# Patient Record
Sex: Female | Born: 1935 | Race: Black or African American | Hispanic: No | Marital: Single | Smoking: Never smoker
Health system: Southern US, Community
[De-identification: ages and names within clinical notes are randomized; demographics above are authoritative.]

## PROBLEM LIST (undated history)

## (undated) DIAGNOSIS — D509 Iron deficiency anemia, unspecified: Secondary | ICD-10-CM

## (undated) DIAGNOSIS — I10 Essential (primary) hypertension: Secondary | ICD-10-CM

## (undated) DIAGNOSIS — E559 Vitamin D deficiency, unspecified: Secondary | ICD-10-CM

## (undated) DIAGNOSIS — R413 Other amnesia: Secondary | ICD-10-CM

## (undated) DIAGNOSIS — N289 Disorder of kidney and ureter, unspecified: Secondary | ICD-10-CM

## (undated) HISTORY — DX: Essential (primary) hypertension: I10

## (undated) HISTORY — DX: Vitamin D deficiency, unspecified: E55.9

## (undated) HISTORY — DX: Iron deficiency anemia, unspecified: D50.9

## (undated) HISTORY — PX: UTERINE FIBROID SURGERY: SHX826

## (undated) HISTORY — DX: Other amnesia: R41.3

---

## 2013-07-28 DIAGNOSIS — K599 Functional intestinal disorder, unspecified: Secondary | ICD-10-CM | POA: Diagnosis not present

## 2013-07-28 DIAGNOSIS — K649 Unspecified hemorrhoids: Secondary | ICD-10-CM | POA: Diagnosis not present

## 2013-07-28 DIAGNOSIS — K625 Hemorrhage of anus and rectum: Secondary | ICD-10-CM | POA: Diagnosis not present

## 2013-07-28 DIAGNOSIS — I10 Essential (primary) hypertension: Secondary | ICD-10-CM | POA: Diagnosis not present

## 2013-07-28 DIAGNOSIS — K5730 Diverticulosis of large intestine without perforation or abscess without bleeding: Secondary | ICD-10-CM | POA: Diagnosis not present

## 2013-07-28 DIAGNOSIS — Z1212 Encounter for screening for malignant neoplasm of rectum: Secondary | ICD-10-CM | POA: Diagnosis not present

## 2013-07-30 DIAGNOSIS — L851 Acquired keratosis [keratoderma] palmaris et plantaris: Secondary | ICD-10-CM | POA: Diagnosis not present

## 2013-07-30 DIAGNOSIS — M79609 Pain in unspecified limb: Secondary | ICD-10-CM | POA: Diagnosis not present

## 2013-07-30 DIAGNOSIS — B351 Tinea unguium: Secondary | ICD-10-CM | POA: Diagnosis not present

## 2013-07-30 DIAGNOSIS — I70219 Atherosclerosis of native arteries of extremities with intermittent claudication, unspecified extremity: Secondary | ICD-10-CM | POA: Diagnosis not present

## 2013-09-25 DIAGNOSIS — K599 Functional intestinal disorder, unspecified: Secondary | ICD-10-CM | POA: Diagnosis not present

## 2013-09-25 DIAGNOSIS — K5730 Diverticulosis of large intestine without perforation or abscess without bleeding: Secondary | ICD-10-CM | POA: Diagnosis not present

## 2013-10-01 DIAGNOSIS — I70219 Atherosclerosis of native arteries of extremities with intermittent claudication, unspecified extremity: Secondary | ICD-10-CM | POA: Diagnosis not present

## 2013-10-01 DIAGNOSIS — L851 Acquired keratosis [keratoderma] palmaris et plantaris: Secondary | ICD-10-CM | POA: Diagnosis not present

## 2013-10-01 DIAGNOSIS — M79609 Pain in unspecified limb: Secondary | ICD-10-CM | POA: Diagnosis not present

## 2013-10-01 DIAGNOSIS — B351 Tinea unguium: Secondary | ICD-10-CM | POA: Diagnosis not present

## 2013-11-05 DIAGNOSIS — H40019 Open angle with borderline findings, low risk, unspecified eye: Secondary | ICD-10-CM | POA: Diagnosis not present

## 2013-12-09 DIAGNOSIS — M79609 Pain in unspecified limb: Secondary | ICD-10-CM | POA: Diagnosis not present

## 2013-12-09 DIAGNOSIS — B351 Tinea unguium: Secondary | ICD-10-CM | POA: Diagnosis not present

## 2013-12-09 DIAGNOSIS — L851 Acquired keratosis [keratoderma] palmaris et plantaris: Secondary | ICD-10-CM | POA: Diagnosis not present

## 2013-12-09 DIAGNOSIS — I70219 Atherosclerosis of native arteries of extremities with intermittent claudication, unspecified extremity: Secondary | ICD-10-CM | POA: Diagnosis not present

## 2014-02-10 DIAGNOSIS — M79609 Pain in unspecified limb: Secondary | ICD-10-CM | POA: Diagnosis not present

## 2014-02-10 DIAGNOSIS — L851 Acquired keratosis [keratoderma] palmaris et plantaris: Secondary | ICD-10-CM | POA: Diagnosis not present

## 2014-02-10 DIAGNOSIS — B351 Tinea unguium: Secondary | ICD-10-CM | POA: Diagnosis not present

## 2014-02-10 DIAGNOSIS — I70219 Atherosclerosis of native arteries of extremities with intermittent claudication, unspecified extremity: Secondary | ICD-10-CM | POA: Diagnosis not present

## 2014-04-02 DIAGNOSIS — K649 Unspecified hemorrhoids: Secondary | ICD-10-CM | POA: Diagnosis not present

## 2014-04-02 DIAGNOSIS — K5730 Diverticulosis of large intestine without perforation or abscess without bleeding: Secondary | ICD-10-CM | POA: Diagnosis not present

## 2014-04-21 DIAGNOSIS — I70213 Atherosclerosis of native arteries of extremities with intermittent claudication, bilateral legs: Secondary | ICD-10-CM | POA: Diagnosis not present

## 2014-04-21 DIAGNOSIS — M79673 Pain in unspecified foot: Secondary | ICD-10-CM | POA: Diagnosis not present

## 2014-04-21 DIAGNOSIS — B351 Tinea unguium: Secondary | ICD-10-CM | POA: Diagnosis not present

## 2014-05-07 DIAGNOSIS — Z1231 Encounter for screening mammogram for malignant neoplasm of breast: Secondary | ICD-10-CM | POA: Diagnosis not present

## 2014-05-10 DIAGNOSIS — H40053 Ocular hypertension, bilateral: Secondary | ICD-10-CM | POA: Diagnosis not present

## 2014-05-11 DIAGNOSIS — I10 Essential (primary) hypertension: Secondary | ICD-10-CM | POA: Diagnosis not present

## 2014-05-11 DIAGNOSIS — E669 Obesity, unspecified: Secondary | ICD-10-CM | POA: Diagnosis not present

## 2014-05-11 DIAGNOSIS — E559 Vitamin D deficiency, unspecified: Secondary | ICD-10-CM | POA: Diagnosis not present

## 2014-05-11 DIAGNOSIS — E782 Mixed hyperlipidemia: Secondary | ICD-10-CM | POA: Diagnosis not present

## 2014-05-11 DIAGNOSIS — Z23 Encounter for immunization: Secondary | ICD-10-CM | POA: Diagnosis not present

## 2014-06-21 DIAGNOSIS — G243 Spasmodic torticollis: Secondary | ICD-10-CM | POA: Diagnosis not present

## 2014-09-16 DIAGNOSIS — M79674 Pain in right toe(s): Secondary | ICD-10-CM | POA: Diagnosis not present

## 2014-09-16 DIAGNOSIS — B351 Tinea unguium: Secondary | ICD-10-CM | POA: Diagnosis not present

## 2014-09-16 DIAGNOSIS — I70213 Atherosclerosis of native arteries of extremities with intermittent claudication, bilateral legs: Secondary | ICD-10-CM | POA: Diagnosis not present

## 2014-09-16 DIAGNOSIS — M79675 Pain in left toe(s): Secondary | ICD-10-CM | POA: Diagnosis not present

## 2014-10-22 DIAGNOSIS — E782 Mixed hyperlipidemia: Secondary | ICD-10-CM | POA: Diagnosis not present

## 2014-10-22 DIAGNOSIS — I10 Essential (primary) hypertension: Secondary | ICD-10-CM | POA: Diagnosis not present

## 2014-11-01 DIAGNOSIS — H25013 Cortical age-related cataract, bilateral: Secondary | ICD-10-CM | POA: Diagnosis not present

## 2015-01-24 DIAGNOSIS — F418 Other specified anxiety disorders: Secondary | ICD-10-CM | POA: Diagnosis not present

## 2015-03-14 DIAGNOSIS — M79675 Pain in left toe(s): Secondary | ICD-10-CM | POA: Diagnosis not present

## 2015-03-14 DIAGNOSIS — B351 Tinea unguium: Secondary | ICD-10-CM | POA: Diagnosis not present

## 2015-03-14 DIAGNOSIS — I70213 Atherosclerosis of native arteries of extremities with intermittent claudication, bilateral legs: Secondary | ICD-10-CM | POA: Diagnosis not present

## 2015-03-14 DIAGNOSIS — M79674 Pain in right toe(s): Secondary | ICD-10-CM | POA: Diagnosis not present

## 2015-05-04 DIAGNOSIS — H40023 Open angle with borderline findings, high risk, bilateral: Secondary | ICD-10-CM | POA: Diagnosis not present

## 2015-05-25 DIAGNOSIS — K589 Irritable bowel syndrome without diarrhea: Secondary | ICD-10-CM | POA: Diagnosis not present

## 2015-07-26 ENCOUNTER — Encounter: Payer: Self-pay | Admitting: Podiatry

## 2015-07-26 ENCOUNTER — Ambulatory Visit (INDEPENDENT_AMBULATORY_CARE_PROVIDER_SITE_OTHER): Payer: Medicare Other | Admitting: Podiatry

## 2015-07-26 VITALS — Resp 16

## 2015-07-26 DIAGNOSIS — Q828 Other specified congenital malformations of skin: Secondary | ICD-10-CM

## 2015-07-26 DIAGNOSIS — M79676 Pain in unspecified toe(s): Secondary | ICD-10-CM | POA: Diagnosis not present

## 2015-07-26 DIAGNOSIS — G629 Polyneuropathy, unspecified: Secondary | ICD-10-CM

## 2015-07-26 DIAGNOSIS — B351 Tinea unguium: Secondary | ICD-10-CM | POA: Diagnosis not present

## 2015-07-26 NOTE — Progress Notes (Signed)
   Subjective:    Patient ID: Floyce Stakes, female    DOB: Jan 23, 1929, 82 y.o.   MRN: [id number]  HPI  82 year old female presents the office today for concerns of thick, painful, elongated toenails for which she could not trim herself. She denies any swelling redness or drainage. She also gets painful calluses to both of her feet without any swelling redness or drainage. She is not diabetic and she has no tendon numbness to her feet no claudication symptoms. No recent injury or trauma. No other complaints.  Review of Systems  All other systems reviewed and are negative.      Objective:   Physical Exam General: AAO x3, NAD  Dermatological: Nails are hypertrophic, dystrophic, brittle, discolored, elongated 10. No surrounding erythema or drainage. There is tenderness palpation on nails 1-5 bilaterally. Along the left-sided metatarsal 5 and right plantar hallux hyperkeratotic lesions. Upon debridement there was no underlying ulceration, drainage or other signs of infection. No other open lesions or pre-ulcerative lesions identified.  Vascular: Dorsalis Pedis artery and Posterior Tibial artery pedal pulses are 1/4 bilateral with immedate capillary fill time. Pedal hair growth present. There is no pain with calf compression, swelling, warmth, erythema.   Neruologic: Sensation decreased with Derrel Nip monofilament although vibratory sensation intact.  Musculoskeletal: Hammertoes presents to lesser digits. No pain, crepitus, or limitation noted with foot and ankle range of motion bilateral. Muscular strength 5/5 in all groups tested bilateral.  Gait: Unassisted, Nonantalgic.       Assessment & Plan:  82 year old female with symptomatic onychomycosis, hyperkeratotic lesions -Treatment options discussed including all alternatives, risks, and complications -Etiology of symptoms were discussed -Nail sharply debrided 10 without complications or bleeding -Hyperkeratotic lesions sharply  debrided 2 without complications or bleeding. Offloading pads dispensed. -She does have decreased sensation with SWMF although she has no symptoms. We'll continue to monitor for neuropathy symptoms.  -Daily foot inspection.  -Follow-up in 3 months or sooner if any problems arise. In the meantime, encouraged to call the office with any questions, concerns, change in symptoms.   Celesta Gentile, DPM

## 2015-08-12 DIAGNOSIS — R413 Other amnesia: Secondary | ICD-10-CM | POA: Diagnosis not present

## 2015-08-12 DIAGNOSIS — I10 Essential (primary) hypertension: Secondary | ICD-10-CM | POA: Diagnosis not present

## 2015-08-12 DIAGNOSIS — E559 Vitamin D deficiency, unspecified: Secondary | ICD-10-CM | POA: Diagnosis not present

## 2015-08-12 DIAGNOSIS — E538 Deficiency of other specified B group vitamins: Secondary | ICD-10-CM | POA: Diagnosis not present

## 2015-08-12 DIAGNOSIS — R419 Unspecified symptoms and signs involving cognitive functions and awareness: Secondary | ICD-10-CM | POA: Diagnosis not present

## 2015-08-15 ENCOUNTER — Ambulatory Visit: Payer: Medicare Other | Admitting: Podiatry

## 2015-08-23 ENCOUNTER — Ambulatory Visit: Payer: Medicare Other | Admitting: Neurology

## 2015-08-24 ENCOUNTER — Encounter: Payer: Self-pay | Admitting: Neurology

## 2015-09-01 ENCOUNTER — Encounter: Payer: Self-pay | Admitting: Neurology

## 2015-09-01 ENCOUNTER — Ambulatory Visit (INDEPENDENT_AMBULATORY_CARE_PROVIDER_SITE_OTHER): Payer: Medicare Other | Admitting: Neurology

## 2015-09-01 VITALS — BP 172/84 | HR 73 | Ht <= 58 in | Wt 150.2 lb

## 2015-09-01 DIAGNOSIS — I10 Essential (primary) hypertension: Secondary | ICD-10-CM | POA: Diagnosis not present

## 2015-09-01 DIAGNOSIS — F05 Delirium due to known physiological condition: Secondary | ICD-10-CM

## 2015-09-01 DIAGNOSIS — R413 Other amnesia: Secondary | ICD-10-CM

## 2015-09-01 DIAGNOSIS — F22 Delusional disorders: Secondary | ICD-10-CM | POA: Diagnosis not present

## 2015-09-01 DIAGNOSIS — R410 Disorientation, unspecified: Secondary | ICD-10-CM

## 2015-09-01 MED ORDER — DONEPEZIL HCL 10 MG PO TABS: 10.0000 mg | ORAL_TABLET | Freq: Every day | ORAL | Status: DC

## 2015-09-01 NOTE — Patient Instructions (Signed)
Remember to drink plenty of fluid, eat healthy meals and do not skip any meals. Try to eat protein with a every meal and eat a healthy snack such as fruit or nuts in between meals. Try to keep a regular sleep-wake schedule and try to exercise daily, particularly in the form of walking, 20-30 minutes a day, if you can.   As far as your medications are concerned, I would like to suggest: Aricept 1/2 tab for a month then increase to a whole tab  As far as diagnostic testing: MRI of thebrain  I would like to see you back in 6 months, sooner if we need to. Please call us with any interim questions, concerns, problems, updates or refill requests.   Our phone number is [phone]. We also have an after hours call service for urgent matters and there is a physician on-call for urgent questions. For any emergencies you know to call 911 or go to the nearest emergency room

## 2015-09-01 NOTE — Progress Notes (Signed)
[facility]    Provider:  Dr Jaynee Eagles Referring Provider: Tia Alert, PA Primary Care Physician:  Tia Alert, [place]  CC:  Memory loss  HPI:  Shawna Lin is a 82 y.o. female here as a referral from Dr. Thayer Jew for memory loss. She just moved into the area and is here with her niece [name]. Memory problems for over a year. Niece provides most information. Patient constantly repeating herself, misplaces things, will think people came intot he house and stole things and then the object will be found. She is really worried about finances. Other niece has power of attorney. She lives in independent living in a senior community. [name] assists with bills otherwise patient would not be able to handle the finances. Patient is good with medication and appears to take them all correctly. Patient does not drive. She performs most of her own ADLs but needs help with some IADLs such as driving, needs help with bills and help with shopping. No accidents in the home,no leaving a stove on for example. She doesn't do a lot of cooking, she uses the microwave mostly. Niece is looking into a meals program. No FHx of memory loss or dementia. She is pretty good at her appointments and will call call niece to remind her of the appointment multiple times, unsure if she forgets she called and then calls again. Patient writes down her appointments, she is organized. No hallucinations. She has significant anxiety and some depression due to change of circumstances. No significant depression and it is getting better, getting out with her friends and shopping and staying social. Memory loss slowly progressive and worsening. She moves money and then forgets and thinks people stole it. Patient is very pleasant. She is a virgin so declines rpr and hiv testing as part of the dementio workup.  Reviewed notes, labs and imaging from outside physicians, which showed: Reviewed records from [facility] physicians. She  last saw them on 08/12/2015 and is a new patient. She c/o memory loss. Both nieces at appointment and reported memory loss. They described patient losing things, paranoia such as thinking people are stealing money from her, some blue mood but nothing significant. Labs showed b12 265 and homocysteine and methylmalonic acid are being followed by pcp. tsh and vit D normal. Bmp nml. No other concerns per family.   Review of Systems: Patient complains of symptoms per HPI as well as the following symptoms: memory loss, confusion, depression, racing thoughts. Pertinent negatives per HPI. All others negative.   Social History   Social History  . Marital Status: Single    Spouse Name: N/A  . Number of Children: 0  . Years of Education: 12   Occupational History  . Retired    Social History Main Topics  . Smoking status: Never Smoker   . Smokeless tobacco: Not on file  . Alcohol Use: No  . Drug Use: No  . Sexual Activity: Not on file   Other Topics Concern  . Not on file   Social History Narrative   Lives alone   Caffeine use: 2 cups coffee per day    Family History  Problem Relation Age of Onset  . Heart attack Father   . Hypertension Mother   . Colon cancer Brother   . Hypertension Brother   . Diabetes Sister   . Kidney failure Sister   . Hypertension Sister   . Dementia Neg Hx     Past Medical History  Diagnosis Date  .  Hypertension   . Vitamin D deficiency   . Memory loss     Past Surgical History  Procedure Laterality Date  . Uterine fibroid surgery      Current Outpatient Prescriptions  Medication Sig Dispense Refill  . Aspirin 81 MG EC tablet Take 81 mg by mouth daily.    . Cholecalciferol (VITAMIN D-3) 1000 units CAPS Take 1 capsule by mouth daily.    . hydrochlorothiazide (HYDRODIURIL) 25 MG tablet Take 25 mg by mouth daily.    Marland Kitchen donepezil (ARICEPT) 10 MG tablet Take 1 tablet (10 mg total) by mouth at bedtime. 30 tablet 12   No current  facility-administered medications for this visit.    Allergies as of 09/01/2015  . (No Known Allergies)    Vitals: BP 172/84 mmHg  Pulse 73  Ht 4\' 10"  (1.473 m)  Wt 150 lb 3.2 oz (68.13 kg)  BMI 31.40 kg/m2 Last Weight:  Wt Readings from Last 1 Encounters:  09/01/15 150 lb 3.2 oz (68.13 kg)   Last Height:   Ht Readings from Last 1 Encounters:  09/01/15 4\' 10"  (1.473 m)   Physical exam: Exam: Gen: NAD, conversant, well nourised, obese, well groomed                     CV: RRR, no MRG. No Carotid Bruits. No peripheral edema, warm, nontender Eyes: Conjunctivae clear without exudates or hemorrhage  Neuro: Detailed Neurologic Exam  Speech:    Speech is normal; fluent and spontaneous with normal comprehension.  Cognition:  MMSE - Mini Mental State Exam 09/01/2015  Orientation to time 1  Orientation to Place 1  Registration 3  Attention/ Calculation 5  Recall 0  Language- name 2 objects 2  Language- repeat 1  Language- follow 3 step command 2  Language- read & follow direction 1  Write a sentence 1  Copy design 0  Total score 17   Cranial Nerves:    The pupils are equal, round, and reactive to light. Attempted fundoscopic exam but could not visualize. Visual fields are full to finger confrontation. Extraocular movements are intact. Trigeminal sensation is intact and the muscles of mastication are normal. The face is symmetric. The palate elevates in the midline. Hearing intact. Voice is normal. Shoulder shrug is normal. The tongue has normal motion without fasciculations.   Coordination:    Normal finger to nose and heel to shin. Normal rapid alternating movements.   Gait:    No ataxia  Motor Observation:    No asymmetry, no atrophy, and no involuntary movements noted. Tone:    Normal muscle tone.    Posture:    Posture is normal. normal erect    Strength:    Strength is V/V in the upper and lower limbs.      Sensation: intact to LT     Reflex  Exam:  DTR's:    Deep tendon reflexes in the upper and lower extremities are symmetrical bilaterally.   Toes:    The toes are downgoing bilaterally.   Clonus:    Clonus is absent.       Assessment/Plan:  34 82 year old with memory loss. MMSE 17/30 appears to be moderate in severity and likely of the alzheimer's type of dementia. TSH, B12 followed by pcp. Need an MRI of the brain. Will start Aricept. F/u 6 months.  Cc: Alvan Dame, MD  [facility] [address] [place], [place] [address]  Phone  [phone] Fax [phone]

## 2015-09-02 ENCOUNTER — Encounter: Payer: Self-pay | Admitting: Neurology

## 2015-09-02 DIAGNOSIS — I10 Essential (primary) hypertension: Secondary | ICD-10-CM | POA: Insufficient documentation

## 2015-09-02 DIAGNOSIS — R413 Other amnesia: Secondary | ICD-10-CM | POA: Insufficient documentation

## 2015-09-14 ENCOUNTER — Ambulatory Visit
Admission: RE | Admit: 2015-09-14 | Discharge: 2015-09-14 | Disposition: A | Discharge status: Home / Self Care | Payer: Medicare Other | Source: Ambulatory Visit | Attending: Neurology | Admitting: Neurology

## 2015-09-14 ENCOUNTER — Other Ambulatory Visit: Payer: Self-pay

## 2015-09-14 DIAGNOSIS — R410 Disorientation, unspecified: Secondary | ICD-10-CM

## 2015-09-14 DIAGNOSIS — F05 Delirium due to known physiological condition: Secondary | ICD-10-CM

## 2015-09-14 DIAGNOSIS — F22 Delusional disorders: Secondary | ICD-10-CM

## 2015-09-14 DIAGNOSIS — R413 Other amnesia: Secondary | ICD-10-CM | POA: Diagnosis not present

## 2015-09-19 ENCOUNTER — Telehealth: Payer: Self-pay | Admitting: *Deleted

## 2015-09-19 NOTE — Telephone Encounter (Signed)
Called and spoke to niece about MRI results per Dr Jaynee Eagles note. She verbalized understanding. OK per DPR.

## 2015-09-19 NOTE — Telephone Encounter (Signed)
-----   Message from Melvenia Beam, MD sent at 09/16/2015 10:32 AM EST ----- Terrence Dupont, there are no strokes or tumors or masses on MRI of the brain. We saw some white matter changes which is likely due to her hugh blood pressure as well as normal aging. Most significant is there is some atrophy. This can be seen in normal aging as all our brains shrink as we age however patient's is more pronounced in the temporal lobes which is the area we associate with memory - and changes here are seen in alzheimer's dementia. I can show it to them when they come to the next appointment. The good news is that there are no strokes or masses but the location of the atrophy is associated with memory loss and alzheimer's dementia. Thanks.

## 2015-10-31 ENCOUNTER — Ambulatory Visit: Payer: Medicare Other | Admitting: Sports Medicine

## 2015-12-21 ENCOUNTER — Encounter (HOSPITAL_COMMUNITY): Payer: Self-pay | Admitting: Emergency Medicine

## 2015-12-21 ENCOUNTER — Emergency Department (HOSPITAL_COMMUNITY): Payer: Medicare Other

## 2015-12-21 ENCOUNTER — Observation Stay (HOSPITAL_COMMUNITY)
Admission: EM | Admit: 2015-12-21 | Discharge: 2015-12-23 | Disposition: A | Discharge status: Home / Self Care | Payer: Medicare Other | Attending: Internal Medicine | Admitting: Internal Medicine

## 2015-12-21 DIAGNOSIS — W19XXXA Unspecified fall, initial encounter: Secondary | ICD-10-CM

## 2015-12-21 DIAGNOSIS — R55 Syncope and collapse: Principal | ICD-10-CM | POA: Insufficient documentation

## 2015-12-21 DIAGNOSIS — D649 Anemia, unspecified: Secondary | ICD-10-CM | POA: Insufficient documentation

## 2015-12-21 DIAGNOSIS — I10 Essential (primary) hypertension: Secondary | ICD-10-CM | POA: Diagnosis not present

## 2015-12-21 DIAGNOSIS — Z7982 Long term (current) use of aspirin: Secondary | ICD-10-CM | POA: Insufficient documentation

## 2015-12-21 DIAGNOSIS — E860 Dehydration: Secondary | ICD-10-CM | POA: Diagnosis not present

## 2015-12-21 DIAGNOSIS — Z789 Other specified health status: Secondary | ICD-10-CM | POA: Diagnosis present

## 2015-12-21 DIAGNOSIS — F0390 Unspecified dementia without behavioral disturbance: Secondary | ICD-10-CM | POA: Diagnosis not present

## 2015-12-21 DIAGNOSIS — R404 Transient alteration of awareness: Secondary | ICD-10-CM | POA: Diagnosis not present

## 2015-12-21 DIAGNOSIS — R262 Difficulty in walking, not elsewhere classified: Secondary | ICD-10-CM | POA: Diagnosis not present

## 2015-12-21 LAB — URINE MICROSCOPIC-ADD ON

## 2015-12-21 LAB — URINALYSIS, ROUTINE W REFLEX MICROSCOPIC
BILIRUBIN URINE: NEGATIVE
GLUCOSE, UA: NEGATIVE mg/dL
KETONES UR: NEGATIVE mg/dL
Nitrite: NEGATIVE
PH: 6.5 (ref 5.0–8.0)
PROTEIN: 30 mg/dL — AB
Specific Gravity, Urine: 1.016 (ref 1.005–1.030)

## 2015-12-21 LAB — BASIC METABOLIC PANEL
Anion gap: 6 (ref 5–15)
BUN: 11 mg/dL (ref 6–20)
CHLORIDE: 105 mmol/L (ref 101–111)
CO2: 28 mmol/L (ref 22–32)
CREATININE: 0.95 mg/dL (ref 0.44–1.00)
Calcium: 9.5 mg/dL (ref 8.9–10.3)
GFR calc Af Amer: >60 mL/min (ref >60)
GFR calc non Af Amer: 53 mL/min — ABNORMAL LOW (ref >60)
GLUCOSE: 100 mg/dL — AB (ref 65–99)
Potassium: 3.5 mmol/L (ref 3.5–5.1)
SODIUM: 139 mmol/L (ref 135–145)

## 2015-12-21 LAB — CBC
HEMATOCRIT: 37.5 % (ref 36.0–46.0)
Hemoglobin: 11.6 g/dL — ABNORMAL LOW (ref 12.0–15.0)
MCH: 24.6 pg — AB (ref 26.0–34.0)
MCHC: 30.9 g/dL (ref 30.0–36.0)
MCV: 79.4 fL (ref 78.0–100.0)
PLATELETS: 251 10*3/uL (ref 150–400)
RBC: 4.72 MIL/uL (ref 3.87–5.11)
RDW: 17 % — AB (ref 11.5–15.5)
WBC: 7 10*3/uL (ref 4.0–10.5)

## 2015-12-21 LAB — I-STAT TROPONIN, ED: Troponin i, poc: 0 ng/mL (ref 0.00–0.08)

## 2015-12-21 LAB — PHOSPHORUS: PHOSPHORUS: 3.9 mg/dL (ref 2.5–4.6)

## 2015-12-21 LAB — CBG MONITORING, ED: Glucose-Capillary: 105 mg/dL — ABNORMAL HIGH (ref 65–99)

## 2015-12-21 LAB — TROPONIN I: Troponin I: <0.03 ng/mL (ref <0.031)

## 2015-12-21 LAB — MAGNESIUM: Magnesium: 2 mg/dL (ref 1.7–2.4)

## 2015-12-21 MED ORDER — DONEPEZIL HCL 10 MG PO TABS
10.0000 mg | ORAL_TABLET | Freq: Every day | ORAL | Status: DC
  Administered 2015-12-21: 10 mg via ORAL
  Filled 2015-12-21 (×2): qty 1

## 2015-12-21 MED ORDER — ENOXAPARIN SODIUM 40 MG/0.4ML ~~LOC~~ SOLN
40.0000 mg | SUBCUTANEOUS | Status: DC
  Administered 2015-12-21: 40 mg via SUBCUTANEOUS
  Filled 2015-12-21 (×2): qty 0.4

## 2015-12-21 MED ORDER — SODIUM CHLORIDE 0.9 % IV SOLN
INTRAVENOUS | Status: AC
  Administered 2015-12-21: 22:00:00 via INTRAVENOUS

## 2015-12-21 MED ORDER — SODIUM CHLORIDE 0.9% FLUSH
3.0000 mL | Freq: Two times a day (BID) | INTRAVENOUS | Status: DC
  Administered 2015-12-21 – 2015-12-22 (×2): 3 mL via INTRAVENOUS

## 2015-12-21 MED ORDER — ASPIRIN EC 81 MG PO TBEC
81.0000 mg | DELAYED_RELEASE_TABLET | Freq: Every day | ORAL | Status: DC
  Administered 2015-12-21 – 2015-12-23 (×3): 81 mg via ORAL
  Filled 2015-12-21 (×3): qty 1

## 2015-12-21 NOTE — ED Provider Notes (Signed)
CSN: [id number]     Arrival date & time 12/21/15  1858 History   First MD Initiated Contact with Patient 12/21/15 1910     Chief Complaint  Patient presents with  . Loss of Consciousness     (Consider location/radiation/quality/duration/timing/severity/associated sxs/prior Treatment) HPI Comments: Patient history of hypertension presents after syncopal episode. She states she was sitting in a chair stating her Bible and suddenly passed out. She did fall to the ground but she denies any injuries from the fall. She denies any headache. She denies any neck or back pain. She states that she feels fine now. She denies any recent illnesses. No preceding symptoms such as dizziness chest pain or shortness of breath. She denies any palpitations. She had some aching in her left leg earlier but denies any leg pain currently. She has some chronic swelling in her left leg as compared to her right which she states is at baseline. There was no witnessed seizure activity. Per bystander, her syncope lasted about a minute.  Patient is a 82 y.o. female presenting with syncope.  Loss of Consciousness Associated symptoms: no chest pain, no diaphoresis, no dizziness, no fever, no headaches, no nausea, no shortness of breath, no vomiting and no weakness     Past Medical History  Diagnosis Date  . Hypertension   . Vitamin D deficiency   . Memory loss    Past Surgical History  Procedure Laterality Date  . Uterine fibroid surgery     Family History  Problem Relation Age of Onset  . Heart attack Father   . Hypertension Mother   . Colon cancer Brother   . Hypertension Brother   . Diabetes Sister   . Kidney failure Sister   . Hypertension Sister   . Dementia Neg Hx    Social History  Substance Use Topics  . Smoking status: Never Smoker   . Smokeless tobacco: None  . Alcohol Use: No   OB History    No data available     Review of Systems  Constitutional: Negative for fever, chills, diaphoresis and  fatigue.  HENT: Negative for congestion, rhinorrhea and sneezing.   Eyes: Negative.   Respiratory: Negative for cough, chest tightness and shortness of breath.   Cardiovascular: Positive for syncope. Negative for chest pain and leg swelling.  Gastrointestinal: Negative for nausea, vomiting, abdominal pain, diarrhea and blood in stool.  Genitourinary: Negative for frequency, hematuria, flank pain and difficulty urinating.  Musculoskeletal: Negative for back pain and arthralgias.  Skin: Negative for rash.  Neurological: Positive for syncope. Negative for dizziness, speech difficulty, weakness, numbness and headaches.      Allergies  Review of patient's allergies indicates no known allergies.  Home Medications   Prior to Admission medications   Medication Sig Start Date End Date Taking? Authorizing Provider  Aspirin 81 MG EC tablet Take 81 mg by mouth daily.    Historical Provider, MD  Cholecalciferol (VITAMIN D-3) 1000 units CAPS Take 1 capsule by mouth daily.    Historical Provider, MD  donepezil (ARICEPT) 10 MG tablet Take 1 tablet (10 mg total) by mouth at bedtime. 09/01/15   Melvenia Beam, MD  hydrochlorothiazide (HYDRODIURIL) 25 MG tablet Take 25 mg by mouth daily.    Historical Provider, MD   BP 149/66 mmHg  Pulse 65  Temp(Src) 99 F (37.2 C) (Oral)  Resp 15  SpO2 100% Physical Exam  Constitutional: She is oriented to person, place, and time. She appears well-developed and well-nourished.  HENT:  Head: Normocephalic and atraumatic.  Eyes: Pupils are equal, round, and reactive to light.  Neck: Normal range of motion. Neck supple.  Cardiovascular: Normal rate and regular rhythm.   Murmur heard. Pulmonary/Chest: Effort normal and breath sounds normal. No respiratory distress. She has no wheezes. She has no rales. She exhibits no tenderness.  Abdominal: Soft. Bowel sounds are normal. There is no tenderness. There is no rebound and no guarding.  Musculoskeletal: Normal range  of motion. She exhibits edema (Trace edema in the bilateral lower extremities with the left being slightly greater than the right, no calf tenderness. Patient states this is at baseline).  Lymphadenopathy:    She has no cervical adenopathy.  Neurological: She is alert and oriented to person, place, and time. She has normal strength. No cranial nerve deficit or sensory deficit. GCS eye subscore is 4. GCS verbal subscore is 5. GCS motor subscore is 6.  Finger to nose intact, no pronator drift  Skin: Skin is warm and dry. No rash noted.  Psychiatric: She has a normal mood and affect.    ED Course  Procedures (including critical care time) Labs Review Labs Reviewed  BASIC METABOLIC PANEL - Abnormal; Notable for the following:    Glucose, Bld 100 (*)    GFR calc non Af Amer 53 (*)    All other components within normal limits  CBC - Abnormal; Notable for the following:    Hemoglobin 11.6 (*)    MCH 24.6 (*)    RDW 17.0 (*)    All other components within normal limits  CBG MONITORING, ED - Abnormal; Notable for the following:    Glucose-Capillary 105 (*)    All other components within normal limits  URINALYSIS, ROUTINE W REFLEX MICROSCOPIC (NOT AT [facility])  Randolm Idol, ED    Imaging Review Dg Chest 2 View  12/21/2015  CLINICAL DATA:  Syncope EXAM: CHEST  2 VIEW COMPARISON:  None. FINDINGS: There is no edema or consolidation. Heart size and pulmonary vascularity are normal. No adenopathy. There is degenerative change in the thoracic spine. IMPRESSION: No edema or consolidation. Electronically Signed   By: Lowella Grip III M.D.   On: 12/21/2015 20:16   I have personally reviewed and evaluated these images and lab results as part of my medical decision-making.   EKG Interpretation   Date/Time:  Wednesday December 21 2015 19:08:10 EDT Ventricular Rate:  67 PR Interval:  154 QRS Duration: 86 QT Interval:  385 QTC Calculation: 406 R Axis:   68 Text Interpretation:  Sinus rhythm  Abnormal R-wave progression, early  transition No old tracing to compare Confirmed by Lynleigh Kovack  MD, [address]  [id number]) on 12/21/2015 7:42:00 PM      MDM   Final diagnoses:  Syncope, unspecified syncope type    Patient presents after syncopal episode. She had no preceding symptoms. She is at baseline now. There is no neurologic deficits. No chest pain shortness of breath or other suggestions of acute coronary syndrome. Her labs are unremarkable. It is concerning for possible arrhythmia. I will consult the hospitalist for admission to telemetry.  Discussed with Dr. Roel Cluck who will admit pt to obs/tele.  Malvin Johns, MD 12/21/15 2040

## 2015-12-21 NOTE — H&P (Signed)
Shawna Lin [id number] DOB: November 02, 1928 DOA: 12/21/2015     PCP: Tia Alert, PA   Outpatient Specialists: [facility]  Patient coming from:  home Lives alone,      Chief Complaint: syncope  HPI: Shawna Lin is a 82 y.o. female with medical history significant of dementia, HTN    Presented with questionable syncope while doing Bible study today. She was sitting in a chair. She turned to the right   to get up and fell to the floor. No head injury, no neurological compliants  As soon as she hit the floor she was awake but disoriented for few minutes. Family dialed 911. BP  201/108 no seizure activity. NO chest pain, no prodrome, no lightheadness no palpitations.  Prior to that she reported her left leg was hurting.  She has been getting more confused and have been diagnosed with dementia by Neurology in [place] and started on Aricept   Regarding pertinent Chronic problems: Patient is here from [place] has hx of HTN but unsure if it well controlled, no hx of CAD or DM. Per family her dementia has been getting worse she started to get more confused.    IN ER: Afebrile BP up to  179/79, HR 64 WBC 7.0 Hg 11.6cr 0.95 CXR (unremarkable)    Hospitalist was called for admission for questionable syncope  Review of Systems:    Pertinent positives include: confusion  Constitutional:  No weight loss, night sweats, Fevers, chills, fatigue, weight loss  HEENT:  No headaches, Difficulty swallowing,Tooth/dental problems,Sore throat,  No sneezing, itching, ear ache, nasal congestion, post nasal drip,  Cardio-vascular:  No chest pain, Orthopnea, PND, anasarca, dizziness, palpitations.no Bilateral lower extremity swelling  GI:  No heartburn, indigestion, abdominal pain, nausea, vomiting, diarrhea, change in bowel habits, loss of appetite, melena, blood in stool, hematemesis Resp:  no shortness of breath at rest. No dyspnea on exertion, No excess mucus, no productive cough, No  non-productive cough, No coughing up of blood.No change in color of mucus.No wheezing. Skin:  no rash or lesions. No jaundice GU:  no dysuria, change in color of urine, no urgency or frequency. No straining to urinate.  No flank pain.  Musculoskeletal:  No joint pain or no joint swelling. No decreased range of motion. No back pain.  Psych:  No change in mood or affect. No depression or anxiety. No memory loss.  Neuro: no localizing neurological complaints, no tingling, no weakness, no double vision, no gait abnormality, no slurred speech, no confusion  As per HPI otherwise 10 point review of systems negative.   Past Medical History: Past Medical History  Diagnosis Date  . Hypertension   . Vitamin D deficiency   . Memory loss    Past Surgical History  Procedure Laterality Date  . Uterine fibroid surgery       Social History:  Ambulatory   Independently     reports that she has never smoked. She does not have any smokeless tobacco history on file. She reports that she does not drink alcohol or use illicit drugs.  Allergies:  No Known Allergies     Family History:    Family History  Problem Relation Age of Onset  . Heart attack Father   . Hypertension Mother   . Colon cancer Brother   . Hypertension Brother   . Diabetes Sister   . Kidney failure Sister   . Hypertension Sister   . Dementia Neg Hx     Medications:  Prior to Admission medications   Medication Sig Start Date End Date Taking? Authorizing Provider  Aspirin 81 MG EC tablet Take 81 mg by mouth daily.    Historical Provider, MD  Cholecalciferol (VITAMIN D-3) 1000 units CAPS Take 1 capsule by mouth daily.    Historical Provider, MD  donepezil (ARICEPT) 10 MG tablet Take 1 tablet (10 mg total) by mouth at bedtime. 09/01/15   Melvenia Beam, MD  hydrochlorothiazide (HYDRODIURIL) 25 MG tablet Take 25 mg by mouth daily.    Historical Provider, MD    Physical Exam: Patient Vitals for the past 24 hrs:  BP  Temp Temp src Pulse Resp SpO2  12/21/15 2023 149/66 mmHg - - 65 15 100 %  12/21/15 1911 179/79 mmHg 99 F (37.2 C) Oral 74 18 99 %  12/21/15 1901 - - - - - 99 %    1. General:  in No Acute distress 2. Psychological: Alert and  Oriented to situation but not date,not president 3. Head/ENT:    Dry Mucous Membranes                          Head Non traumatic, neck supple                            Poor Dentition 4. SKIN:   decreased Skin turgor,  Skin clean Dry and intact no rash 5. Heart: Regular rate and rhythm slight  Murmur no Rub or gallop 6. Lungs:   no wheezes or crackles   7. Abdomen: Soft, non-tender, Non distended 8. Lower extremities: no clubbing, cyanosis, or edema 9. Neurologically Grossly intact, moving all 4 extremities equally 10. MSK: Normal range of motion   body mass index is unknown because there is no weight on file.  Labs on Admission:   Labs on Admission: I have personally reviewed following labs and imaging studies  CBC:  Recent Labs Lab 12/21/15 1917  WBC 7.0  HGB 11.6*  HCT 37.5  MCV 79.4  PLT 123XX123   Basic Metabolic Panel:  Recent Labs Lab 12/21/15 1917  NA 139  K 3.5  CL 105  CO2 28  GLUCOSE 100*  BUN 11  CREATININE 0.95  CALCIUM 9.5   GFR: CrCl cannot be calculated (Unknown ideal weight.). Liver Function Tests: No results for input(s): AST, ALT, ALKPHOS, BILITOT, PROT, ALBUMIN in the last 168 hours. No results for input(s): LIPASE, AMYLASE in the last 168 hours. No results for input(s): AMMONIA in the last 168 hours. Coagulation Profile: No results for input(s): INR, PROTIME in the last 168 hours. Cardiac Enzymes: No results for input(s): CKTOTAL, CKMB, CKMBINDEX, TROPONINI in the last 168 hours. BNP (last 3 results) No results for input(s): PROBNP in the last 8760 hours. HbA1C: No results for input(s): HGBA1C in the last 72 hours. CBG:  Recent Labs Lab 12/21/15 1915  GLUCAP 105*   Lipid Profile: No results for input(s):  CHOL, HDL, LDLCALC, TRIG, CHOLHDL, LDLDIRECT in the last 72 hours. Thyroid Function Tests: No results for input(s): TSH, T4TOTAL, FREET4, T3FREE, THYROIDAB in the last 72 hours. Anemia Panel: No results for input(s): VITAMINB12, FOLATE, FERRITIN, TIBC, IRON, RETICCTPCT in the last 72 hours. Urine analysis:    Component Value Date/Time   COLORURINE YELLOW 12/21/2015 1931   APPEARANCEUR CLEAR 12/21/2015 1931   LABSPEC 1.016 12/21/2015 1931   PHURINE 6.5 12/21/2015 1931   GLUCOSEU NEGATIVE 12/21/2015 1931  HGBUR TRACE* 12/21/2015 1931   BILIRUBINUR NEGATIVE 12/21/2015 1931   KETONESUR NEGATIVE 12/21/2015 1931   PROTEINUR 30* 12/21/2015 1931   NITRITE NEGATIVE 12/21/2015 1931   LEUKOCYTESUR SMALL* 12/21/2015 1931   Sepsis Labs: @LABRCNTIP (procalcitonin:4,lacticidven:4) )No results found for this or any previous visit (from the past 240 hour(s)).   UA   no evidence of UTI  No results found for: HGBA1C  CrCl cannot be calculated (Unknown ideal weight.).  BNP (last 3 results) No results for input(s): PROBNP in the last 8760 hours.   ECG REPORT  Independently reviewed Rate:67  Rhythm: NSR ST&T Change: No acute ischemic changes   QTC 406  There were no vitals filed for this visit.   Cultures: No results found for: SDES, Early, CULT, REPTSTATUS   Radiological Exams on Admission: Dg Chest 2 View  12/21/2015  CLINICAL DATA:  Syncope EXAM: CHEST  2 VIEW COMPARISON:  None. FINDINGS: There is no edema or consolidation. Heart size and pulmonary vascularity are normal. No adenopathy. There is degenerative change in the thoracic spine. IMPRESSION: No edema or consolidation. Electronically Signed   By: Lowella Grip III M.D.   On: 12/21/2015 20:16    Chart has been reviewed    Assessment/Plan  82 y.o. female with medical history significant of dementia, HTN here with questionable syncopal event  Present on Admission:  . Atypical syncope - Admit to telemetry, cycle  Cardiac enzymes,  obtain echogram, carotid Doppler due to advanced age.  Etiology unclear is not clear if patient truly suffered a complete loss of consciousness. We will rehydrate his subclinical dehydration as part of process. Obtain orthostatics This could've been a possible mechanical fall but given advanced age will evaluate thoroughly  . HTN (hypertension) poorly controlled. For tonight hold hydrochlorothiazide and rehydrate give labetalol when necessary  . Dementia continue Aricept  . No blood products no indication for transfusion at this point     Other plan as per orders.  DVT prophylaxis:    Lovenox     Code Status:  FULL CODE  as per patient   Jehovah witness no blood products  Family Communication:   Family  at  Bedside  plan of care was discussed with Niece Karie Mainland [phone]  Sister Elisabeth Most [phone]  Disposition Plan:  To home once workup is complete and patient is stable   Consults called: none   Admission status:   obs    Level of care     tele     [place] 12/21/2015, 9:27 PM    [facility]  Pager [phone]   after 2 AM please page floor coverage PA If 7AM-7PM, please contact the day team taking care of the patient  [email or web address]  Password [email or web address]

## 2015-12-21 NOTE — ED Notes (Signed)
Admitting at bedside 

## 2015-12-21 NOTE — ED Notes (Signed)
Per EMS pt was at bible study sitting down, then had syncopal episode in chair. Patient had negative stroke screen. Pt was hypertension 201/108- sts on HCTZ and had recent lowered dosage. Pt alert and oriented x4 at present, no neuro deficits, mildly incontinent of urine. Bystanders deny seizure like activity. Pt denies headache, visual disturbances.

## 2015-12-21 NOTE — ED Notes (Signed)
EDP at bedside  

## 2015-12-21 NOTE — ED Notes (Signed)
Pt ambulatory to bathroom. Standby assist. Steady gait noted

## 2015-12-22 ENCOUNTER — Observation Stay (HOSPITAL_BASED_OUTPATIENT_CLINIC_OR_DEPARTMENT_OTHER): Payer: Medicare Other

## 2015-12-22 ENCOUNTER — Observation Stay (HOSPITAL_COMMUNITY): Payer: Medicare Other

## 2015-12-22 DIAGNOSIS — R55 Syncope and collapse: Secondary | ICD-10-CM

## 2015-12-22 DIAGNOSIS — I10 Essential (primary) hypertension: Secondary | ICD-10-CM

## 2015-12-22 DIAGNOSIS — F0390 Unspecified dementia without behavioral disturbance: Secondary | ICD-10-CM | POA: Diagnosis not present

## 2015-12-22 DIAGNOSIS — S0990XA Unspecified injury of head, initial encounter: Secondary | ICD-10-CM | POA: Diagnosis not present

## 2015-12-22 LAB — ECHOCARDIOGRAM COMPLETE
AOPV: 0.59 m/s
AV Area VTI index: 1.01 cm2/m2
AV Area VTI: 1.49 cm2
AV Area mean vel: 1.5 cm2
AV VEL mean LVOT/AV: 0.59
AV pk vel: 255 cm/s
AVA: 1.6 cm2
AVAREAMEANVIN: 0.94 cm2/m2
AVG: 13 mmHg
AVPG: 26 mmHg
CHL CUP AV PEAK INDEX: 0.94
CHL CUP AV VEL: 1.6
CHL CUP MV DEC (S): 232
CHL CUP TV REG PEAK VELOCITY: 259 cm/s
DOP CAL AO MEAN VELOCITY: 164 cm/s
E decel time: 232 msec
E/e' ratio: 11.71
FS: 46 % — AB (ref 28–44)
IVS/LV PW RATIO, ED: 1.27
LA ID, A-P, ES: 33 mm
LA diam end sys: 33 mm
LA vol A4C: 64.5 ml
LA vol index: 38.3 mL/m2
LA vol: 60.9 mL
LADIAMINDEX: 2.08 cm/m2
LV PW d: 12.4 mm — AB (ref 0.6–1.1)
LV TDI E'LATERAL: 7.94
LV TDI E'MEDIAL: 7.51
LVEEAVG: 11.71
LVEEMED: 11.71
LVELAT: 7.94 cm/s
LVOT VTI: 32.5 cm
LVOT area: 2.54 cm2
LVOT peak grad rest: 9 mmHg
LVOT peak vel: 150 cm/s
LVOTD: 18 mm
LVOTSV: 83 mL
LVOTVTI: 0.63 cm
MV Peak grad: 3 mmHg
MVPKAVEL: 135 m/s
MVPKEVEL: 93 m/s
TAPSE: 19.5 mm
TR max vel: 259 cm/s
VTI: 51.6 cm
Valve area index: 1.01
WEIGHTICAEL: 2339.2 [oz_av]

## 2015-12-22 LAB — COMPREHENSIVE METABOLIC PANEL
ALBUMIN: 3.1 g/dL — AB (ref 3.5–5.0)
ALT: 20 U/L (ref 14–54)
AST: 29 U/L (ref 15–41)
Alkaline Phosphatase: 70 U/L (ref 38–126)
Anion gap: 4 — ABNORMAL LOW (ref 5–15)
BUN: 9 mg/dL (ref 6–20)
CHLORIDE: 108 mmol/L (ref 101–111)
CO2: 30 mmol/L (ref 22–32)
Calcium: 9.1 mg/dL (ref 8.9–10.3)
Creatinine, Ser: 0.82 mg/dL (ref 0.44–1.00)
GFR calc Af Amer: >60 mL/min (ref >60)
GFR calc non Af Amer: >60 mL/min (ref >60)
GLUCOSE: 81 mg/dL (ref 65–99)
POTASSIUM: 3.6 mmol/L (ref 3.5–5.1)
SODIUM: 142 mmol/L (ref 135–145)
Total Bilirubin: 0.5 mg/dL (ref 0.3–1.2)
Total Protein: 5.2 g/dL — ABNORMAL LOW (ref 6.5–8.1)

## 2015-12-22 LAB — VAS US CAROTID
LCCAPDIAS: 14 cm/s
LEFT ECA DIAS: -19 cm/s
LEFT VERTEBRAL DIAS: 15 cm/s
LICADDIAS: -13 cm/s
LICAPDIAS: -11 cm/s
LICAPSYS: -71 cm/s
Left CCA dist dias: -25 cm/s
Left CCA dist sys: -112 cm/s
Left CCA prox sys: 128 cm/s
Left ICA dist sys: -53 cm/s
RIGHT ECA DIAS: -17 cm/s
RIGHT VERTEBRAL DIAS: 10 cm/s
Right CCA prox dias: 15 cm/s
Right CCA prox sys: 162 cm/s
Right cca dist sys: -82 cm/s

## 2015-12-22 LAB — CBC
HEMATOCRIT: 33.3 % — AB (ref 36.0–46.0)
Hemoglobin: 10.3 g/dL — ABNORMAL LOW (ref 12.0–15.0)
MCH: 24.4 pg — ABNORMAL LOW (ref 26.0–34.0)
MCHC: 30.9 g/dL (ref 30.0–36.0)
MCV: 78.9 fL (ref 78.0–100.0)
Platelets: 243 10*3/uL (ref 150–400)
RBC: 4.22 MIL/uL (ref 3.87–5.11)
RDW: 17.1 % — AB (ref 11.5–15.5)
WBC: 7.2 10*3/uL (ref 4.0–10.5)

## 2015-12-22 LAB — GLUCOSE, CAPILLARY
Glucose-Capillary: 81 mg/dL (ref 65–99)
Glucose-Capillary: 122 mg/dL — ABNORMAL HIGH (ref 65–99)

## 2015-12-22 LAB — TROPONIN I

## 2015-12-22 MED ORDER — SODIUM CHLORIDE 0.9 % IV SOLN
INTRAVENOUS | Status: AC
  Administered 2015-12-22: 14:00:00 via INTRAVENOUS

## 2015-12-22 NOTE — Progress Notes (Signed)
  Echocardiogram 2D Echocardiogram has been performed.  Darlina Sicilian M 12/22/2015, 4:54 PM

## 2015-12-22 NOTE — Progress Notes (Signed)
PROGRESS NOTE    Shawna Lin  [id number] DOB: 11-19-1928 DOA: 12/21/2015 PCP: Tia Alert, PA    Brief Narrative:  Shawna Lin is a 82 y.o. female with medical history significant of dementia, HTN, Presented with questionable syncope while doing Bible study today.   Assessment & Plan:   Active Problems:   HTN (hypertension)   Syncope   Dementia   No blood products   Atypical syncope  Syncopal episode : Unclear etiology,  Cardiac enzymes negative. Asymptomatic.  Ct head negative.  Echo pending.  Carotid duplex negative to 1 to 39% Orthostatic vital signs.    Dementia: stable.   Mild normocytic anemia: Stable.   Hypertension:  Better controlled.     DVT prophylaxis: (Lovenox/ Code Status: (Full/ Family Communication: fa,ily at bedside.  Disposition Plan: pending pt eval.    Consultants:   none   Procedures:   CT head  Vascular duplex  echocardiogram  Antimicrobials: none  Subjective: Alert and comfortable.   Objective: Filed Vitals:   12/22/15 1209 12/22/15 1340 12/22/15 1342 12/22/15 1356  BP: 154/91 133/84 161/80 158/70  Pulse: 62 72 81 67  Temp: 97.6 F (36.4 C)     TempSrc:      Resp: 18 18 18    Weight:      SpO2: 100% 100% 95%     Intake/Output Summary (Last 24 hours) at 12/22/15 1649 Last data filed at 12/22/15 1044  Gross per 24 hour  Intake   1680 ml  Output      0 ml  Net   1680 ml   Filed Weights   12/22/15 0715  Weight: 66.316 kg (146 lb 3.2 oz)    Examination:  General exam: Appears calm and comfortable  Respiratory system: Clear to auscultation. Respiratory effort normal. Cardiovascular system: S1 & S2 heard, RRR. No JVD, murmurs, rubs, gallops or clicks. No pedal edema. Gastrointestinal system: Abdomen is nondistended, soft and nontender. No organomegaly or masses felt. Normal bowel sounds heard. Central nervous system: Alert and oriented. No focal neurological deficits. Extremities: Symmetric 5 x 5  power. Skin: No rashes, lesions or ulcers Psychiatry: Judgement and insight appear normal. Mood & affect appropriate.     Data Reviewed: I have personally reviewed following labs and imaging studies  CBC:  Recent Labs Lab 12/21/15 1917 12/22/15 0327  WBC 7.0 7.2  HGB 11.6* 10.3*  HCT 37.5 33.3*  MCV 79.4 78.9  PLT 251 0000000   Basic Metabolic Panel:  Recent Labs Lab 12/21/15 1917 12/21/15 2214 12/22/15 0327  NA 139  --  142  K 3.5  --  3.6  CL 105  --  108  CO2 28  --  30  GLUCOSE 100*  --  81  BUN 11  --  9  CREATININE 0.95  --  0.82  CALCIUM 9.5  --  9.1  MG  --  2.0  --   PHOS  --  3.9  --    GFR: Estimated Creatinine Clearance: 40.5 mL/min (by C-G formula based on Cr of 0.82). Liver Function Tests:  Recent Labs Lab 12/22/15 0327  AST 29  ALT 20  ALKPHOS 70  BILITOT 0.5  PROT 5.2*  ALBUMIN 3.1*   No results for input(s): LIPASE, AMYLASE in the last 168 hours. No results for input(s): AMMONIA in the last 168 hours. Coagulation Profile: No results for input(s): INR, PROTIME in the last 168 hours. Cardiac Enzymes:  Recent Labs Lab 12/21/15 2214 12/22/15 0327 12/22/15 [id number]  TROPONINI <0.03 <0.03 <0.03   BNP (last 3 results) No results for input(s): PROBNP in the last 8760 hours. HbA1C: No results for input(s): HGBA1C in the last 72 hours. CBG:  Recent Labs Lab 12/21/15 1915 12/22/15 0744 12/22/15 0746  GLUCAP 105* 81 122*   Lipid Profile: No results for input(s): CHOL, HDL, LDLCALC, TRIG, CHOLHDL, LDLDIRECT in the last 72 hours. Thyroid Function Tests: No results for input(s): TSH, T4TOTAL, FREET4, T3FREE, THYROIDAB in the last 72 hours. Anemia Panel: No results for input(s): VITAMINB12, FOLATE, FERRITIN, TIBC, IRON, RETICCTPCT in the last 72 hours. Sepsis Labs: No results for input(s): PROCALCITON, LATICACIDVEN in the last 168 hours.  No results found for this or any previous visit (from the past 240 hour(s)).       Radiology  Studies: Dg Chest 2 View  12/21/2015  CLINICAL DATA:  Syncope EXAM: CHEST  2 VIEW COMPARISON:  None. FINDINGS: There is no edema or consolidation. Heart size and pulmonary vascularity are normal. No adenopathy. There is degenerative change in the thoracic spine. IMPRESSION: No edema or consolidation. Electronically Signed   By: Lowella Grip III M.D.   On: 12/21/2015 20:16        Scheduled Meds: . aspirin EC  81 mg Oral Daily  . donepezil  10 mg Oral QHS  . enoxaparin (LOVENOX) injection  40 mg Subcutaneous Q24H  . sodium chloride flush  3 mL Intravenous Q12H   Continuous Infusions: . sodium chloride 75 mL/hr at 12/22/15 1359        Time spent: 25 minutes.     Hosie Poisson, MD [facility] Pager [phone]  If 7PM-7AM, please contact night-coverage [email or web address] Password [facility] 12/22/2015, 4:49 PM

## 2015-12-22 NOTE — Progress Notes (Signed)
*  PRELIMINARY RESULTS* Vascular Ultrasound Carotid Duplex has been completed.  Preliminary findings: Bilateral: No significant (1-39%) ICA stenosis. Antegrade vertebral flow.     Landry Mellow, RDMS, RVT  12/22/2015, 3:08 PM

## 2015-12-22 NOTE — Care Management Obs Status (Signed)
[facility] NOTIFICATION   Patient Details  Name: Shawna Lin MRN: [id number] Date of Birth: 09/23/1928   Medicare Observation Status Notification Given:  Yes    Sharin Mons, RN 12/22/2015, 11:11 AM

## 2015-12-23 DIAGNOSIS — I10 Essential (primary) hypertension: Secondary | ICD-10-CM | POA: Diagnosis not present

## 2015-12-23 DIAGNOSIS — F0390 Unspecified dementia without behavioral disturbance: Secondary | ICD-10-CM | POA: Diagnosis not present

## 2015-12-23 DIAGNOSIS — R55 Syncope and collapse: Secondary | ICD-10-CM | POA: Diagnosis not present

## 2015-12-23 LAB — GLUCOSE, CAPILLARY: GLUCOSE-CAPILLARY: 81 mg/dL (ref 65–99)

## 2015-12-23 LAB — HEMOGLOBIN A1C
Hgb A1c MFr Bld: 5.8 % — ABNORMAL HIGH (ref 4.8–5.6)
Mean Plasma Glucose: 120 mg/dL

## 2015-12-23 NOTE — Evaluation (Signed)
Physical Therapy Evaluation Patient Details Name: Shawna Lin MRN: [id number] DOB: [birth year] Today's Date: 12/23/2015   History of Present Illness  Pt adm with syncope. PMH - dementia, syncope  Clinical Impression  Pt doing well with mobility and no further PT needed.  Needs rollator for home. Ready for dc from PT standpoint.      Follow Up Recommendations No PT follow up    Equipment Recommendations  Other (comment) (rollator)    Recommendations for Other Services       Precautions / Restrictions Precautions Precautions: None      Mobility  Bed Mobility                  Transfers Overall transfer level: Independent                  Ambulation/Gait Ambulation/Gait assistance: Modified independent (Device/Increase time) Ambulation Distance (Feet): 350 Feet Assistive device: 4-wheeled walker;Rolling walker (2 wheeled);None Gait Pattern/deviations: WFL(Within Functional Limits)   Gait velocity interpretation: at or above normal speed for age/gender General Gait Details: Gait steadier with use of rollator  Stairs            Wheelchair Mobility    Modified Rankin (Stroke Patients Only)       Balance Overall balance assessment: Modified Independent                                           Pertinent Vitals/Pain Pain Assessment: No/denies pain    Home Living Family/patient expects to be discharged to:: Private residence Living Arrangements: Alone Available Help at Discharge: Friend(s) Type of Home: Apartment Home Access: Level entry     Home Layout: One level Home Equipment: None      Prior Function Level of Independence: Independent with assistive device(s)         Comments: Pushes shopping cart when outside     Hand Dominance        Extremity/Trunk Assessment   Upper Extremity Assessment: Overall WFL for tasks assessed           Lower Extremity Assessment: Overall WFL for tasks assessed         Communication   Communication: No difficulties  Cognition Arousal/Alertness: Awake/alert Behavior During Therapy: WFL for tasks assessed/performed Overall Cognitive Status: History of cognitive impairments - at baseline                      General Comments      Exercises        Assessment/Plan    PT Assessment Patent does not need any further PT services  PT Diagnosis Difficulty walking   PT Problem List    PT Treatment Interventions     PT Goals (Current goals can be found in the Care Plan section) Acute Rehab PT Goals PT Goal Formulation: All assessment and education complete, DC therapy    Frequency     Barriers to discharge        Co-evaluation               End of Session   Activity Tolerance: Patient tolerated treatment well Patient left: in chair;with call bell/phone within reach;with family/visitor present Nurse Communication: Mobility status    Functional Assessment Tool Used: clinical judgement Functional Limitation: Mobility: Walking and moving around Mobility: Walking and Moving Around Current Status [id number]): 0 percent impaired, limited or  restricted Mobility: Walking and Moving Around Goal Status [phone]): 0 percent impaired, limited or restricted Mobility: Walking and Moving Around Discharge Status [phone]): 0 percent impaired, limited or restricted    Time: [id number] PT Time Calculation (min) (ACUTE ONLY): 10 min   Charges:   PT Evaluation $PT Eval Low Complexity: 1 Procedure     PT G Codes:   PT G-Codes **NOT FOR INPATIENT CLASS** Functional Assessment Tool Used: clinical judgement Functional Limitation: Mobility: Walking and moving around Mobility: Walking and Moving Around Current Status [id number]): 0 percent impaired, limited or restricted Mobility: Walking and Moving Around Goal Status [id number]): 0 percent impaired, limited or restricted Mobility: Walking and Moving Around Discharge Status [phone]): 0 percent impaired,  limited or restricted    [facility] 12/23/2015, 8:55 AM [facility] PT [phone]

## 2015-12-23 NOTE — Care Management Note (Signed)
Case Management Note  Patient Details  Name: Channing Amparan MRN: [id number] Date of Birth: 1929-03-31  Subjective/Objective:                    Action/Plan: Plan is to d/c to home today.  Expected Discharge Date:       12/23/2015           Expected Discharge Plan:  Home/Self Care  In-House Referral:     Discharge planning Services  CM Consult  Post Acute Care Choice:    Choice offered to:     DME Arranged:  Walker rolling with seat DME Agency:   [facility] [place] [phone]  HH Arranged:    [facility] Agency:    Status of Service:  Completed, signed off  Medicare Important Message Given:    Date Medicare IM Given:    Medicare IM give by:    Date Additional Medicare IM Given:    Additional Medicare Important Message give by:     If discussed at [place] of Stay Meetings, dates discussed:    Additional Comments:  Sharin Mons, RN 12/23/2015, 9:56 AM

## 2015-12-23 NOTE — Progress Notes (Signed)
Floyce Stakes to be D/C'd Home per MD order.  Discussed with the patient and caregiver and all questions fully answered.  VSS, Skin clean, dry and intact without evidence of skin break down, no evidence of skin tears noted. IV catheter discontinued intact. Site without signs and symptoms of complications. Dressing and pressure applied.  An After Visit Summary was printed and given to the patient. Patient received prescription.  D/c education completed with patient/family including follow up instructions, medication list, d/c activities limitations if indicated, with other d/c instructions as indicated by MD - patient able to verbalize understanding, all questions fully answered.   Patient instructed to return to ED, call 911, or call MD for any changes in condition.   Patient to be escorted via [place], and D/C home via private auto.  [place], Natahlia Hoggard C 12/23/2015 1:54 PM

## 2015-12-23 NOTE — Progress Notes (Signed)
Pt was admitted from ED per wheel chair accompanied by nurse tech and pt church member, self introduced to pt ID bracelet checked, admission package given fall prevention plan discussed with pt and also oriented to the unit and the equipment, call light and phone  witihin reach, pt able to demonstrate  How to use them, treatment started as prescribed and will continue to monitor

## 2015-12-23 NOTE — Progress Notes (Signed)
Pt refused all her scheduled medication for the shift, pt said she rather prefer to continue her home medication when she gets discharged, will continue to monitor

## 2015-12-26 NOTE — Discharge Summary (Signed)
Physician Discharge Summary  Shawna Lin [id number] DOB: [birth year] DOA: 12/21/2015  PCP: Tia Alert, PA  Admit date: 12/21/2015 Discharge date: 12/23/2015  Admitted From: HOME) Disposition:  HOME  Recommendations for Outpatient Follow-up:  1. Follow up with PCP in 1-2 weeks 2. Please obtain BMP/CBC in one week  Home Health:YES Equipment/Devices:NONE  Discharge Condition:STABLE.  CODE STATUS:FULL  Diet recommendation: Heart Healthy   Brief/Interim Summary:  Shawna Lin is a 82 y.o. female with medical history significant of dementia, HTN, Presented with questionable syncope while doing Bible study today.  Discharge Diagnoses:  Active Problems:   HTN (hypertension)   Syncope   Dementia   No blood products   Atypical syncope  Syncopal episode/ Presyncopal : Unclear etiology,  Cardiac enzymes negative. Asymptomatic. carotid duplex does not show significant stenosis, echo is unremarkable.  Telemetry overnight does not show any arrythmias.  Ct head negative.  Carotid duplex negative to 1 to 39% Orthostatic vital signs negative on discharge.  She was given IV fluids and recommended to follow upw ith PCP for further evaluation.      Dementia: stable.   Mild normocytic anemia: Stable.   Hypertension:  Better controlled.   Discharge Instructions  Discharge Instructions    Diet - low sodium heart healthy    Complete by:  As directed      Discharge instructions    Complete by:  As directed   Follow up with PCP in one week.            Medication List    TAKE these medications        Aspirin 81 MG EC tablet  Take 81 mg by mouth daily.     donepezil 10 MG tablet  Commonly known as:  ARICEPT  Take 1 tablet (10 mg total) by mouth at bedtime.     hydrochlorothiazide 25 MG tablet  Commonly known as:  HYDRODIURIL  Take 25 mg by mouth daily.           Follow-up Information    Follow up with [facility].   Why:   walker(rolator) to be delivered to bedside prior to discharge   Contact information:   [address] [place] [address] [phone]      No Known Allergies  Consultations:  none   Procedures/Studies: Dg Chest 2 View  12/21/2015  CLINICAL DATA:  Syncope EXAM: CHEST  2 VIEW COMPARISON:  None. FINDINGS: There is no edema or consolidation. Heart size and pulmonary vascularity are normal. No adenopathy. There is degenerative change in the thoracic spine. IMPRESSION: No edema or consolidation. Electronically Signed   By: Lowella Grip III M.D.   On: 12/21/2015 20:16   Ct Head Wo Contrast  12/22/2015  CLINICAL DATA:  Fall yesterday with possible head injury, initial encounter EXAM: CT HEAD WITHOUT CONTRAST TECHNIQUE: Contiguous axial images were obtained from the base of the skull through the vertex without intravenous contrast. COMPARISON:  None. FINDINGS: Mild atrophic changes are noted. No findings to suggest acute hemorrhage, acute infarction or space-occupying mass lesion are noted. IMPRESSION: Mild atrophic changes without acute abnormality. Electronically Signed   By: Inez Catalina M.D.   On: 12/22/2015 17:20       Subjective: No new complaints.   Discharge Exam: Filed Vitals:   12/23/15 0444 12/23/15 0711  BP: 186/70 157/71  Pulse: 62 61  Temp: 97.8 F (36.6 C)   Resp: 20    Filed Vitals:   12/22/15  2231 12/23/15 0444 12/23/15 0629 12/23/15 0711  BP: 170/64 186/70  157/71  Pulse: 67 62  61  Temp: 98.6 F (37 C) 97.8 F (36.6 C)    TempSrc: Oral Oral    Resp: 20 20    Weight:   66.9 kg (147 lb 7.8 oz)   SpO2: 98% 100%      General: Pt is alert, awake, not in acute distress Cardiovascular: RRR, S1/S2 +, no rubs, no gallops Respiratory: CTA bilaterally, no wheezing, no rhonchi Abdominal: Soft, NT, ND, bowel sounds + Extremities: no edema, no cyanosis    The results of significant diagnostics from this hospitalization (including imaging,  microbiology, ancillary and laboratory) are listed below for reference.     Microbiology: No results found for this or any previous visit (from the past 240 hour(s)).   Labs: BNP (last 3 results) No results for input(s): BNP in the last 8760 hours. Basic Metabolic Panel:  Recent Labs Lab 12/21/15 1917 12/21/15 2214 12/22/15 0327  NA 139  --  142  K 3.5  --  3.6  CL 105  --  108  CO2 28  --  30  GLUCOSE 100*  --  81  BUN 11  --  9  CREATININE 0.95  --  0.82  CALCIUM 9.5  --  9.1  MG  --  2.0  --   PHOS  --  3.9  --    Liver Function Tests:  Recent Labs Lab 12/22/15 0327  AST 29  ALT 20  ALKPHOS 70  BILITOT 0.5  PROT 5.2*  ALBUMIN 3.1*   No results for input(s): LIPASE, AMYLASE in the last 168 hours. No results for input(s): AMMONIA in the last 168 hours. CBC:  Recent Labs Lab 12/21/15 1917 12/22/15 0327  WBC 7.0 7.2  HGB 11.6* 10.3*  HCT 37.5 33.3*  MCV 79.4 78.9  PLT 251 243   Cardiac Enzymes:  Recent Labs Lab 12/21/15 2214 12/22/15 0327 12/22/15 0943  TROPONINI <0.03 <0.03 <0.03   BNP: Invalid input(s): POCBNP CBG:  Recent Labs Lab 12/21/15 1915 12/22/15 0744 12/22/15 0746 12/23/15 0734  GLUCAP 105* 81 122* 81   D-Dimer No results for input(s): DDIMER in the last 72 hours. Hgb A1c No results for input(s): HGBA1C in the last 72 hours. Lipid Profile No results for input(s): CHOL, HDL, LDLCALC, TRIG, CHOLHDL, LDLDIRECT in the last 72 hours. Thyroid function studies No results for input(s): TSH, T4TOTAL, T3FREE, THYROIDAB in the last 72 hours.  Invalid input(s): FREET3 Anemia work up No results for input(s): VITAMINB12, FOLATE, FERRITIN, TIBC, IRON, RETICCTPCT in the last 72 hours. Urinalysis    Component Value Date/Time   COLORURINE YELLOW 12/21/2015 1931   APPEARANCEUR CLEAR 12/21/2015 1931   LABSPEC 1.016 12/21/2015 1931   PHURINE 6.5 12/21/2015 1931   GLUCOSEU NEGATIVE 12/21/2015 1931   HGBUR TRACE* 12/21/2015 1931    BILIRUBINUR NEGATIVE 12/21/2015 1931   KETONESUR NEGATIVE 12/21/2015 1931   PROTEINUR 30* 12/21/2015 1931   NITRITE NEGATIVE 12/21/2015 1931   LEUKOCYTESUR SMALL* 12/21/2015 1931   Sepsis Labs Invalid input(s): PROCALCITONIN,  WBC,  LACTICIDVEN Microbiology No results found for this or any previous visit (from the past 240 hour(s)).   Time coordinating discharge: Over 30 minutes  SIGNED:   Hosie Poisson, MD  [facility] 12/26/2015, 9:50 AM Pager [phone]   If 7PM-7AM, please contact night-coverage [email or web address] Password [email or web address]

## 2016-01-05 DIAGNOSIS — Z09 Encounter for follow-up examination after completed treatment for conditions other than malignant neoplasm: Secondary | ICD-10-CM | POA: Diagnosis not present

## 2016-01-05 DIAGNOSIS — D649 Anemia, unspecified: Secondary | ICD-10-CM | POA: Diagnosis not present

## 2016-01-05 DIAGNOSIS — I10 Essential (primary) hypertension: Secondary | ICD-10-CM | POA: Diagnosis not present

## 2016-01-05 DIAGNOSIS — R55 Syncope and collapse: Secondary | ICD-10-CM | POA: Diagnosis not present

## 2016-01-30 ENCOUNTER — Ambulatory Visit (INDEPENDENT_AMBULATORY_CARE_PROVIDER_SITE_OTHER): Payer: Medicare Other | Admitting: Sports Medicine

## 2016-01-30 ENCOUNTER — Encounter: Payer: Self-pay | Admitting: Sports Medicine

## 2016-01-30 DIAGNOSIS — Q828 Other specified congenital malformations of skin: Secondary | ICD-10-CM | POA: Diagnosis not present

## 2016-01-30 DIAGNOSIS — B351 Tinea unguium: Secondary | ICD-10-CM | POA: Diagnosis not present

## 2016-01-30 DIAGNOSIS — M79676 Pain in unspecified toe(s): Secondary | ICD-10-CM

## 2016-01-30 DIAGNOSIS — G629 Polyneuropathy, unspecified: Secondary | ICD-10-CM

## 2016-01-30 NOTE — Progress Notes (Signed)
Patient ID: Shawna Lin, female   DOB: 31-Dec-1928, 82 y.o.   MRN: [id number] Subjective: Shawna Lin is a 82 y.o. female patient seen today in office with complaint of painful right big toe callus and thickened and elongated toenails; unable to trim. Patient denies history of Diabetes or Vascular disease. Admits to some neuropathy in feet. Patient has no other pedal complaints at this time.   Patient Active Problem List   Diagnosis Date Noted  . Syncope 12/21/2015  . Dementia 12/21/2015  . No blood products 12/21/2015  . 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 Memory loss 09/02/2015  . HTN (hypertension) 09/02/2015    Current Outpatient Prescriptions on File Prior to Visit  Medication Sig Dispense Refill  . Aspirin 81 MG EC tablet Take 81 mg by mouth daily.    Marland Kitchen donepezil (ARICEPT) 10 MG tablet Take 1 tablet (10 mg total) by mouth at bedtime. 30 tablet 12  . hydrochlorothiazide (HYDRODIURIL) 25 MG tablet Take 25 mg by mouth daily.     No current facility-administered medications on file prior to visit.    No Known Allergies  Objective: Physical Exam  General: Well developed, nourished, no acute distress, awake, alert and oriented x 3  Vascular: Dorsalis pedis artery 1/4 bilateral, Posterior tibial artery 1/4 bilateral, skin temperature warm to warm proximal to distal bilateral lower extremities, no varicosities, scant pedal hair present bilateral.  Neurological: Gross sensation present via light touch bilateral. Monofilament decreased bilateral.  Dermatological: Skin is warm, dry, and supple bilateral, Nails 1-10 are tender, long, thick, and discolored with mild subungal debris, no webspace macerations present bilateral, no open lesions present bilateral, + callus/hyperkeratotic tissue present plantar right hallux. No signs of infection bilateral.  Musculoskeletal: Asymptomatic hammertoe boney deformities noted bilateral. Muscular strength within normal limits without painon range of  motion. No pain with calf compression bilateral.  Assessment and Plan:  Problem List Items Addressed This Visit    None    Visit Diagnoses    Dermatophytosis of nail    -  Primary    Porokeratosis        Pain of toe, unspecified laterality        Neuropathy ([place])           -Examined patient.  -Discussed treatment options for painful callus and  mycotic nails. -Mechanically debrided callus x 1 using sterile chisel blade and reduced mycotic nails with sterile nail nipper and dremel nail file without incident. -Recommend good supportive shoes for foot type -Recommend vinegar soaks as needed and encouraged keeping nails filled to help with the thickness in between nail trims -Patient to return in 3 months for follow up evaluation or sooner if symptoms worsen.  Landis Martins, DPM

## 2016-03-01 ENCOUNTER — Ambulatory Visit (INDEPENDENT_AMBULATORY_CARE_PROVIDER_SITE_OTHER): Payer: Medicare Other | Admitting: Neurology

## 2016-03-01 ENCOUNTER — Encounter: Payer: Self-pay | Admitting: Neurology

## 2016-03-01 VITALS — BP 133/75 | HR 62 | Ht <= 58 in | Wt 142.0 lb

## 2016-03-01 DIAGNOSIS — F0390 Unspecified dementia without behavioral disturbance: Secondary | ICD-10-CM | POA: Diagnosis not present

## 2016-03-01 NOTE — Patient Instructions (Signed)
Remember to drink plenty of fluid, eat healthy meals and do not skip any meals. Try to eat protein with a every meal and eat a healthy snack such as fruit or nuts in between meals. Try to keep a regular sleep-wake schedule and try to exercise daily, particularly in the form of walking, 20-30 minutes a day, if you can.   As far as your medications are concerned, I would like to suggest: Aricept daily  I would like to see you back in 6 months, sooner if we need to. Please call us with any interim questions, concerns, problems, updates or refill requests.   Our phone number is [phone]. We also have an after hours call service for urgent matters and there is a physician on-call for urgent questions. For any emergencies you know to call 911 or go to the nearest emergency room

## 2016-03-01 NOTE — Progress Notes (Signed)
[facility]    Provider:  Dr Jaynee Eagles Referring Provider: Tia Alert, PA Primary Care Physician:  Tia Alert, [place]  CC:  Memory loss  Patient says her niece was stealing from her. Here with her "spiritual sister". Patient says she has not heard from either of her nieces for months. This woman today is helping with her bills. (Will speak to the director of the nursing home to ensure there is nothing unusual goin gon here, she has only know this woman for 6 months). Trilby Leaver is a Psychologist, sport and exercise Witness like patient and they met this way. Memory is doing well. Memory is stable. She is alert, she is going to bible study, doing field work and mentally she is alert. She is I independent living. Patient cooks herslef, sometimes she goes out to eat. There is a dining room but she has her own apartment. Her friend. takes her shopping but she keeps her home clean. No side effects from the aricept. She has never had sex, no children. Discussed MRI of the brain.  MRi brain 09/2015:  This MRI of the brain without contrast shows the following: 1.   Scattered T2/FLAIR hyperintense foci in the pons in the hemispheres consistent with moderate chronic microvascular ischemic change. 2.   Mild generalized cortical atrophy most pronounced in the mesial temporal lobes. 3.   There are no acute findings.  HPI:  Shawna Lin is a 82 y.o. female here as a referral from Dr. Thayer Jew for memory loss. She just moved into the area and is here with her niece Shawna Lin. Memory problems for over a year. Niece provides most information. Patient constantly repeating herself, misplaces things, will think people came intot he house and stole things and then the object will be found. She is really worried about finances. Other niece has power of attorney. She lives in independent living in a senior community. Shawna Lin assists with bills otherwise patient would not be able to handle the finances. Patient is  good with medication and appears to take them all correctly. Patient does not drive. She performs most of her own ADLs but needs help with some IADLs such as driving, needs help with bills and help with shopping. No accidents in the home,no leaving a stove on for example. She doesn't do a lot of cooking, she uses the microwave mostly. Niece is looking into a meals program. No FHx of memory loss or dementia. She is pretty good at her appointments and will call call niece to remind her of the appointment multiple times, unsure if she forgets she called and then calls again. Patient writes down her appointments, she is organized. No hallucinations. She has significant anxiety and some depression due to change of circumstances. No significant depression and it is getting better, getting out with her friends and shopping and staying social. Memory loss slowly progressive and worsening. She moves money and then forgets and thinks people stole it. Patient is very pleasant. She is a virgin so declines rpr and hiv testing as part of the dementio workup.  Reviewed notes, labs and imaging from outside physicians, which showed: Reviewed records from [facility] physicians. She last saw them on 08/12/2015 and is a new patient. She c/o memory loss. Both nieces at appointment and reported memory loss. They described patient losing things, paranoia such as thinking people are stealing money from her, some blue mood but nothing significant. Labs showed b12 265 and homocysteine and methylmalonic acid are being followed by pcp. tsh  and vit D normal. Bmp nml. No other concerns per family.   Review of Systems: Patient complains of symptoms per HPI as well as the following symptoms: memory loss, confusion, depression, racing thoughts. Pertinent negatives per HPI. All others negative.     Social History   Social History  . Marital status: Single    Spouse name: N/A  . Number of children: 0  . Years of education: 12    Occupational History  . Retired    Social History Main Topics  . Smoking status: Never Smoker  . Smokeless tobacco: Not on file  . Alcohol use No  . Drug use: No  . Sexual activity: Not on file   Other Topics Concern  . Not on file   Social History Narrative   Lives alone   Caffeine use: 2 cups coffee per day    Family History  Problem Relation Age of Onset  . Heart attack Father   . Hypertension Mother   . Colon cancer Brother   . Hypertension Brother   . Diabetes Sister   . Kidney failure Sister   . Hypertension Sister   . Dementia Neg Hx     Past Medical History:  Diagnosis Date  . Hypertension   . Memory loss   . Vitamin D deficiency     Past Surgical History:  Procedure Laterality Date  . UTERINE FIBROID SURGERY      Current Outpatient Prescriptions  Medication Sig Dispense Refill  . Aspirin 81 MG EC tablet Take 81 mg by mouth daily.    Marland Kitchen donepezil (ARICEPT) 10 MG tablet Take 1 tablet (10 mg total) by mouth at bedtime. 30 tablet 12  . hydrochlorothiazide (HYDRODIURIL) 12.5 MG tablet Take 12.5 mg by mouth daily.     No current facility-administered medications for this visit.     Allergies as of 03/01/2016  . (No Known Allergies)    Vitals: BP 133/75 (BP Location: Right Arm, Patient Position: Sitting, Cuff Size: Normal)   Pulse 62   Ht 4' 10"  (1.473 m)   Wt 142 lb (64.4 kg)   BMI 29.68 kg/m  Last Weight:  Wt Readings from Last 1 Encounters:  03/01/16 142 lb (64.4 kg)   Last Height:   Ht Readings from Last 1 Encounters:  03/01/16 4' 10"  (1.473 m)    MMSE - Mini Mental State Exam 03/01/2016 09/01/2015  Orientation to time 4 1  Orientation to Place 2 1  Registration 3 3  Attention/ Calculation 2 5  Recall 0 0  Language- name 2 objects 2 2  Language- repeat 1 1  Language- follow 3 step command 3 2  Language- read & follow direction 1 1  Write a sentence 1 1  Copy design 0 0  Total score 19 17   Cranial Nerves:    The pupils are  equal, round, and reactive to light. Attempted fundoscopic exam but could not visualize. Visual fields are full to finger confrontation. Extraocular movements are intact. Trigeminal sensation is intact and the muscles of mastication are normal. The face is symmetric. The palate elevates in the midline. Hearing intact. Voice is normal. Shoulder shrug is normal. The tongue has normal motion without fasciculations.   Coordination:    Normal finger to nose and heel to shin. Normal rapid alternating movements.   Gait:    No ataxia  Motor Observation:    No asymmetry, no atrophy, and no involuntary movements noted. Tone:    Normal muscle tone.  Posture:    Posture is normal. normal erect    Strength:    Strength is V/V in the upper and lower limbs.      Sensation: intact to LT     Reflex Exam:  DTR's:    Deep tendon reflexes in the upper and lower extremities are symmetrical bilaterally.   Toes:    The toes are downgoing bilaterally.   Clonus:    Clonus is absent.       Assessment/Plan:  [age] year old with memory loss. MMSE 19/30 appears to be moderate in severity and likely of the alzheimer's type of dementia. TSH, B12 followed by pcp. Continue  Aricept. At later time may be appropriate for Namenda. Discussed dementia is a neurodegenerative process what will continue to worsen.  F/u 6 months.  Patient is here with her "spiritual sister" Lambert Keto is a [place] like patient and they met this way. This women is helping with finances. I called the director of [facility] and left message for Elmyra Ricks or Joey at the nursing home to inform them and ensure nothing unusual is going on. [phone].  Cc: Alvan Dame, MD  [facility] [address] [place], [place] [address]  Phone [phone] Fax [phone]  A total of 30 minutes was spent face-to-face with this patient. Over half this time was  spent on counseling patient on the dementia diagnosis and different diagnostic and therapeutic options available.

## 2016-03-03 ENCOUNTER — Encounter: Payer: Self-pay | Admitting: Neurology

## 2016-03-09 ENCOUNTER — Encounter: Payer: Self-pay | Admitting: Neurology

## 2016-03-09 ENCOUNTER — Ambulatory Visit (INDEPENDENT_AMBULATORY_CARE_PROVIDER_SITE_OTHER): Payer: Medicare Other | Admitting: Neurology

## 2016-03-09 VITALS — BP 142/70 | HR 63 | Temp 98.1°F | Ht <= 58 in | Wt 144.1 lb

## 2016-03-09 DIAGNOSIS — R69 Illness, unspecified: Secondary | ICD-10-CM

## 2016-03-09 NOTE — Progress Notes (Signed)
Patient has been seeing neurologist Dr. Jaynee Eagles, and had just seen her last week. Discussed this with patient and friend, they report they were just following instructions to see neurologist (apparently PCP made appointments). They would like to continue to see Dr. Jaynee Eagles and will follow-up with her as scheduled.

## 2016-03-27 DIAGNOSIS — Z23 Encounter for immunization: Secondary | ICD-10-CM | POA: Diagnosis not present

## 2016-04-03 ENCOUNTER — Ambulatory Visit: Payer: Medicare Other | Admitting: Sports Medicine

## 2016-04-17 ENCOUNTER — Ambulatory Visit (INDEPENDENT_AMBULATORY_CARE_PROVIDER_SITE_OTHER): Payer: Medicare Other | Admitting: Sports Medicine

## 2016-04-17 ENCOUNTER — Encounter: Payer: Self-pay | Admitting: Sports Medicine

## 2016-04-17 DIAGNOSIS — B351 Tinea unguium: Secondary | ICD-10-CM

## 2016-04-17 DIAGNOSIS — Q828 Other specified congenital malformations of skin: Secondary | ICD-10-CM | POA: Diagnosis not present

## 2016-04-17 DIAGNOSIS — G629 Polyneuropathy, unspecified: Secondary | ICD-10-CM

## 2016-04-17 DIAGNOSIS — M79676 Pain in unspecified toe(s): Secondary | ICD-10-CM | POA: Diagnosis not present

## 2016-04-17 NOTE — Progress Notes (Signed)
Patient ID: Shawna Lin, female   DOB: 12-31-1928, 82 y.o.   MRN: [id number] Subjective: Shawna Lin is a 82 y.o. female patient seen today in office with complaint of painful right big toe callus and thickened and elongated toenails; unable to trim. Patient denies any changes since last visit with medical history. Patient has no other pedal complaints at this time.   Patient Active Problem List   Diagnosis Date Noted  . Syncope 12/21/2015  . Dementia 12/21/2015  . No blood products 12/21/2015  . 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 Memory loss 09/02/2015  . HTN (hypertension) 09/02/2015    Current Outpatient Prescriptions on File Prior to Visit  Medication Sig Dispense Refill  . Aspirin 81 MG EC tablet Take 81 mg by mouth daily.    Marland Kitchen donepezil (ARICEPT) 10 MG tablet Take 1 tablet (10 mg total) by mouth at bedtime. 30 tablet 12  . hydrochlorothiazide (HYDRODIURIL) 12.5 MG tablet Take 12.5 mg by mouth daily.     No current facility-administered medications on file prior to visit.     No Known Allergies  Objective: Physical Exam  General: Well developed, nourished, no acute distress, awake, alert and oriented x 3  Vascular: Dorsalis pedis artery 1/4 bilateral, Posterior tibial artery 1/4 bilateral, skin temperature warm to warm proximal to distal bilateral lower extremities, no varicosities, scant pedal hair present bilateral.  Neurological: Gross sensation present via light touch bilateral. Monofilament decreased bilateral.  Dermatological: Skin is warm, dry, and supple bilateral, Nails 1-10 are tender, long, thick, and discolored with mild subungal debris, no webspace macerations present bilateral, no open lesions present bilateral, + callus/hyperkeratotic tissue present plantar right hallux. No signs of infection bilateral.  Musculoskeletal: Asymptomatic hammertoe boney deformities noted bilateral. Muscular strength within normal limits without painon range of motion. No pain with  calf compression bilateral.  Assessment and Plan:  Problem List Items Addressed This Visit    None    Visit Diagnoses    Dermatophytosis of nail    -  Primary   Porokeratosis       Pain of toe, unspecified laterality       Neuropathy ([place])          -Examined patient.  -Discussed treatment options for painful callus and  mycotic nails. -Mechanically debrided callus x 1 using sterile chisel blade and reduced mycotic nails with sterile nail nipper and dremel nail file without incident. -Recommend good supportive shoes for foot type -Recommend continue with vinegar soaks as needed and encouraged keeping nails filled to help with the thickness in between nail trims -Patient to return in 3 months for follow up evaluation or sooner if symptoms worsen.  Shawna Lin, DPM

## 2016-06-29 DIAGNOSIS — K921 Melena: Secondary | ICD-10-CM | POA: Diagnosis not present

## 2016-06-29 DIAGNOSIS — R413 Other amnesia: Secondary | ICD-10-CM | POA: Diagnosis not present

## 2016-06-29 DIAGNOSIS — I10 Essential (primary) hypertension: Secondary | ICD-10-CM | POA: Diagnosis not present

## 2016-07-12 DIAGNOSIS — K625 Hemorrhage of anus and rectum: Secondary | ICD-10-CM | POA: Diagnosis not present

## 2016-07-17 ENCOUNTER — Encounter: Payer: Self-pay | Admitting: Sports Medicine

## 2016-07-17 ENCOUNTER — Ambulatory Visit (INDEPENDENT_AMBULATORY_CARE_PROVIDER_SITE_OTHER): Payer: Medicare Other | Admitting: Sports Medicine

## 2016-07-17 DIAGNOSIS — B351 Tinea unguium: Secondary | ICD-10-CM

## 2016-07-17 DIAGNOSIS — M79676 Pain in unspecified toe(s): Secondary | ICD-10-CM | POA: Diagnosis not present

## 2016-07-17 DIAGNOSIS — G629 Polyneuropathy, unspecified: Secondary | ICD-10-CM

## 2016-07-17 DIAGNOSIS — Q828 Other specified congenital malformations of skin: Secondary | ICD-10-CM

## 2016-07-17 NOTE — Progress Notes (Signed)
Patient ID: Shawna Lin, female   DOB: 03/27/1929, 83 y.o.   MRN: [id number] Subjective: Shawna Lin is a 83 y.o. female patient seen today in office with complaint of painful big toe callus and thickened and elongated toenails; unable to trim. Patient denies any changes since last visit with medical history. Patient has no other pedal complaints at this time.   Patient Active Problem List   Diagnosis Date Noted  . Syncope 12/21/2015  . Dementia 12/21/2015  . No blood products 12/21/2015  . 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 Memory loss 09/02/2015  . HTN (hypertension) 09/02/2015    Current Outpatient Prescriptions on File Prior to Visit  Medication Sig Dispense Refill  . Aspirin 81 MG EC tablet Take 81 mg by mouth daily.    Marland Kitchen donepezil (ARICEPT) 10 MG tablet Take 1 tablet (10 mg total) by mouth at bedtime. 30 tablet 12  . hydrochlorothiazide (HYDRODIURIL) 12.5 MG tablet Take 12.5 mg by mouth daily.     No current facility-administered medications on file prior to visit.     No Known Allergies  Objective: Physical Exam  General: Well developed, nourished, no acute distress, awake, alert and oriented x 3  Vascular: Dorsalis pedis artery 1/4 bilateral, Posterior tibial artery 1/4 bilateral, skin temperature warm to warm proximal to distal bilateral lower extremities, no varicosities, scant pedal hair present bilateral.  Neurological: Gross sensation present via light touch bilateral. Monofilament decreased bilateral.  Dermatological: Skin is warm, dry, and supple bilateral, Nails 1-10 are tender, long, thick, and discolored with mild subungal debris, no webspace macerations present bilateral, no open lesions present bilateral, + minimal callus/hyperkeratotic tissue present plantar left> right hallux. No signs of infection bilateral.  Musculoskeletal: Asymptomatic hammertoe boney deformities noted bilateral. Muscular strength within normal limits without painon range of motion. No pain  with calf compression bilateral.  Assessment and Plan:  Problem List Items Addressed This Visit    None    Visit Diagnoses    Dermatophytosis of nail    -  Primary   Porokeratosis       Pain of toe, unspecified laterality       Neuropathy ([place])          -Examined patient.  -Discussed treatment options for painful callus and  mycotic nails. -Mechanically debrided and reduced mycotic nails with sterile nail nipper and dremel nail file without incident. -Callus at hallux minimal at this visit smoothed with dremel -Recommend good supportive shoes for foot type -Recommend continue with vinegar soaks as needed and encouraged keeping nails filled to help with the thickness in between nail trims -Patient to return in 3 months for follow up evaluation or sooner if symptoms worsen.  Landis Martins, DPM

## 2016-09-04 ENCOUNTER — Ambulatory Visit: Payer: Medicare Other | Admitting: Adult Health

## 2016-09-05 ENCOUNTER — Encounter: Payer: Self-pay | Admitting: Adult Health

## 2016-09-25 ENCOUNTER — Ambulatory Visit: Payer: Medicare Other | Admitting: Sports Medicine

## 2016-10-09 ENCOUNTER — Ambulatory Visit: Payer: Medicare Other | Admitting: Sports Medicine

## 2016-10-16 ENCOUNTER — Encounter: Payer: Self-pay | Admitting: Sports Medicine

## 2016-10-16 ENCOUNTER — Ambulatory Visit (INDEPENDENT_AMBULATORY_CARE_PROVIDER_SITE_OTHER): Payer: Medicare Other | Admitting: Sports Medicine

## 2016-10-16 DIAGNOSIS — M79676 Pain in unspecified toe(s): Secondary | ICD-10-CM | POA: Diagnosis not present

## 2016-10-16 DIAGNOSIS — B351 Tinea unguium: Secondary | ICD-10-CM | POA: Diagnosis not present

## 2016-10-16 DIAGNOSIS — Q828 Other specified congenital malformations of skin: Secondary | ICD-10-CM

## 2016-10-16 DIAGNOSIS — G629 Polyneuropathy, unspecified: Secondary | ICD-10-CM

## 2016-10-16 NOTE — Progress Notes (Signed)
Patient ID: Dashia Caldeira, female   DOB: [birth year], 83 y.o.   MRN: [id number] Subjective: Tensley Wery is a 83 y.o. female patient seen today in office with complaint of painful big toe callus and thickened and elongated toenails; unable to trim. Patient denies any changes since last visit with medical history. Patient has no other pedal complaints at this time.   Patient Active Problem List   Diagnosis Date Noted  . Syncope 12/21/2015  . Dementia 12/21/2015  . No blood products 12/21/2015  . 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 Memory loss 09/02/2015  . HTN (hypertension) 09/02/2015    Current Outpatient Prescriptions on File Prior to Visit  Medication Sig Dispense Refill  . Aspirin 81 MG EC tablet Take 81 mg by mouth daily.    Marland Kitchen donepezil (ARICEPT) 10 MG tablet Take 1 tablet (10 mg total) by mouth at bedtime. 30 tablet 12  . hydrochlorothiazide (HYDRODIURIL) 12.5 MG tablet Take 12.5 mg by mouth daily.     No current facility-administered medications on file prior to visit.     No Known Allergies  Objective: Physical Exam  General: Well developed, nourished, no acute distress, awake, alert and oriented x 3  Vascular: Dorsalis pedis artery 1/4 bilateral, Posterior tibial artery 1/4 bilateral, skin temperature warm to warm proximal to distal bilateral lower extremities, no varicosities, scant pedal hair present bilateral.  Neurological: Gross sensation present via light touch bilateral. Monofilament decreased bilateral.  Dermatological: Skin is warm, dry, and supple bilateral, Nails 1-10 are tender, long, thick, and discolored with mild subungal debris, no webspace macerations present bilateral, no open lesions present bilateral, + minimal callus/hyperkeratotic tissue present plantar left> right hallux. No signs of infection bilateral.  Musculoskeletal: Asymptomatic hammertoe boney deformities noted bilateral. Muscular strength within normal limits without painon range of motion. No pain  with calf compression bilateral.  Assessment and Plan:  Problem List Items Addressed This Visit    None    Visit Diagnoses    Dermatophytosis of nail    -  Primary   Porokeratosis       Pain of toe, unspecified laterality       Neuropathy ([place])          -Examined patient.  -Discussed treatment options for painful callus and  mycotic nails. -Mechanically debrided and reduced mycotic nails with sterile nail nipper and dremel nail file without incident. -Callus at hallux minimal at this visit smoothed with dremel -Recommend good supportive shoes for foot type -Recommend continue with vinegar soaks as needed and encouraged keeping nails filled to help with the thickness in between nail trims -Patient to return in 2.5 to 3 months for follow up evaluation or sooner if symptoms worsen.  Landis Martins, DPM

## 2016-11-20 ENCOUNTER — Telehealth: Payer: Self-pay

## 2016-11-20 NOTE — Telephone Encounter (Signed)
It appears that pt called back and has been rescheduled for Dr. Jaynee Eagles in June.

## 2016-11-20 NOTE — Telephone Encounter (Signed)
I called Shawna Lin to get her appt with Jinny Blossom, NP r/s on 11/22/2016. Shawna Lin advised me that she will call her niece because her niece drives her to appts and she will call us back to r/s. I gave her our office number. I will call her again this afternoon if she has not called [place] back. (of note, a niece is not listed on her DPR.)

## 2016-11-22 ENCOUNTER — Ambulatory Visit: Payer: Medicare Other | Admitting: Adult Health

## 2016-12-18 ENCOUNTER — Encounter (INDEPENDENT_AMBULATORY_CARE_PROVIDER_SITE_OTHER): Payer: Self-pay

## 2016-12-18 ENCOUNTER — Ambulatory Visit (INDEPENDENT_AMBULATORY_CARE_PROVIDER_SITE_OTHER): Payer: Medicare Other | Admitting: Neurology

## 2016-12-18 ENCOUNTER — Other Ambulatory Visit: Payer: Self-pay | Admitting: Neurology

## 2016-12-18 ENCOUNTER — Encounter: Payer: Self-pay | Admitting: Neurology

## 2016-12-18 VITALS — BP 143/72 | HR 64 | Ht 60.0 in | Wt 148.4 lb

## 2016-12-18 DIAGNOSIS — F0280 Dementia in other diseases classified elsewhere without behavioral disturbance: Secondary | ICD-10-CM

## 2016-12-18 DIAGNOSIS — G301 Alzheimer's disease with late onset: Secondary | ICD-10-CM

## 2016-12-18 MED ORDER — MEMANTINE HCL 5 MG PO TABS: 5.0000 mg | ORAL_TABLET | Freq: Two times a day (BID) | ORAL | 6 refills | Status: DC

## 2016-12-18 NOTE — Patient Instructions (Addendum)
Remember to drink plenty of fluid, eat healthy meals and do not skip any meals. Try to eat protein with a every meal and eat a healthy snack such as fruit or nuts in between meals. Try to keep a regular sleep-wake schedule and try to exercise daily, particularly in the form of walking, 20-30 minutes a day, if you can.   As far as your medications are concerned, I would like to suggest: Continue Aricept (Donepezil). Start Namenda (Memantine) 5mg  twice daily and at next appointment will increase.  I would like to see you back in 6 months, sooner if we need to. Please call us with any interim questions, concerns, problems, updates or refill requests.   Our phone number is [phone]. We also have an after hours call service for urgent matters and there is a physician on-call for urgent questions. For any emergencies you know to call 911 or go to the nearest emergency room  Memantine Tablets What is this medicine? MEMANTINE (MEM an teen) is used to treat dementia caused by Alzheimer's disease. This medicine may be used for other purposes; ask your health care provider or pharmacist if you have questions. COMMON BRAND NAME(S): Namenda What should I tell my health care provider before I take this medicine? They need to know if you have any of these conditions: -difficulty passing urine -kidney disease -liver disease -seizures -an unusual or allergic reaction to memantine, other medicines, foods, dyes, or preservatives -pregnant or trying to get pregnant -breast-feeding How should I use this medicine? Take this medicine by mouth with a glass of water. Follow the directions on the prescription label. You may take this medicine with or without food. Take your doses at regular intervals. Do not take your medicine more often than directed. Continue to take your medicine even if you feel better. Do not stop taking except on the advice of your doctor or health care professional. Talk to your  pediatrician regarding the use of this medicine in children. Special care may be needed. Overdosage: If you think you have taken too much of this medicine contact a poison control center or emergency room at once. NOTE: This medicine is only for you. Do not share this medicine with others. What if I miss a dose? If you miss a dose, take it as soon as you can. If it is almost time for your next dose, take only that dose. Do not take double or extra doses. If you do not take your medicine for several days, contact your health care provider. Your dose may need to be changed. What may interact with this medicine? -acetazolamide -amantadine -cimetidine -dextromethorphan -dofetilide -hydrochlorothiazide -ketamine -metformin -methazolamide -quinidine -ranitidine -sodium bicarbonate -triamterene This list may not describe all possible interactions. Give your health care provider a list of all the medicines, herbs, non-prescription drugs, or dietary supplements you use. Also tell them if you smoke, drink alcohol, or use illegal drugs. Some items may interact with your medicine. What should I watch for while using this medicine? Visit your doctor or health care professional for regular checks on your progress. Check with your doctor or health care professional if there is no improvement in your symptoms or if they get worse. You may get drowsy or dizzy. Do not drive, use machinery, or do anything that needs mental alertness until you know how this drug affects you. Do not stand or sit up quickly, especially if you are an older patient. This reduces the risk of dizzy or fainting spells.  Alcohol can make you more drowsy and dizzy. Avoid alcoholic drinks. What side effects may I notice from receiving this medicine? Side effects that you should report to your doctor or health care professional as soon as possible: -allergic reactions like skin rash, itching or hives, swelling of the face, lips, or  tongue -agitation or a feeling of restlessness -depressed mood -dizziness -hallucinations -redness, blistering, peeling or loosening of the skin, including inside the mouth -seizures -vomiting Side effects that usually do not require medical attention (report to your doctor or health care professional if they continue or are bothersome): -constipation -diarrhea -headache -nausea -trouble sleeping This list may not describe all possible side effects. Call your doctor for medical advice about side effects. You may report side effects to FDA at 1-800-FDA-1088. Where should I keep my medicine? Keep out of the reach of children. Store at room temperature between 15 degrees and 30 degrees C (59 degrees and 86 degrees F). Throw away any unused medicine after the expiration date. NOTE: This sheet is a summary. It may not cover all possible information. If you have questions about this medicine, talk to your doctor, pharmacist, or health care provider.  2018 Elsevier/Gold Standard (2013-04-20 14:10:42)
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
## 2017-01-15 ENCOUNTER — Ambulatory Visit: Payer: Medicare Other | Admitting: Sports Medicine

## 2017-02-19 ENCOUNTER — Ambulatory Visit (INDEPENDENT_AMBULATORY_CARE_PROVIDER_SITE_OTHER): Payer: Medicare Other | Admitting: Sports Medicine

## 2017-02-19 DIAGNOSIS — G629 Polyneuropathy, unspecified: Secondary | ICD-10-CM

## 2017-02-19 DIAGNOSIS — B351 Tinea unguium: Secondary | ICD-10-CM | POA: Diagnosis not present

## 2017-02-19 DIAGNOSIS — M79676 Pain in unspecified toe(s): Secondary | ICD-10-CM

## 2017-02-19 NOTE — Patient Instructions (Signed)
Soak with 1 cup of vinegar to 8 cups of warm water for 20 minutes twice per week. After soaks file nails to help with thickness to nails.

## 2017-02-19 NOTE — Progress Notes (Signed)
Patient ID: Shawna Lin, female   DOB: 07/11/1929, 83 y.o.   MRN: [id number] Subjective: Shawna Lin is a 83 y.o. female patient seen today in office with complaint of painful big toe callus and thickened and elongated toenails; unable to trim. Patient denies any changes since last visit with medical history. Patient has no other pedal complaints at this time.   Patient Active Problem List   Diagnosis Date Noted  . Syncope 12/21/2015  . Dementia 12/21/2015  . No blood products 12/21/2015  . 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 Memory loss 09/02/2015  . HTN (hypertension) 09/02/2015    Current Outpatient Prescriptions on File Prior to Visit  Medication Sig Dispense Refill  . Aspirin 81 MG EC tablet Take 81 mg by mouth daily.    Marland Kitchen donepezil (ARICEPT) 10 MG tablet TAKE ONE TABLET BY MOUTH AT BEDTIME 90 tablet 3  . hydrochlorothiazide (HYDRODIURIL) 12.5 MG tablet Take 12.5 mg by mouth daily.    . memantine (NAMENDA) 5 MG tablet Take 1 tablet (5 mg total) by mouth 2 (two) times daily. 60 tablet 6   No current facility-administered medications on file prior to visit.     No Known Allergies  Objective: Physical Exam  General: Well developed, nourished, no acute distress, awake, alert and oriented x 3  Vascular: Dorsalis pedis artery 1/4 bilateral, Posterior tibial artery 1/4 bilateral, skin temperature warm to warm proximal to distal bilateral lower extremities, no varicosities, scant pedal hair present bilateral.  Neurological: Gross sensation present via light touch bilateral. Monofilament decreased bilateral.  Dermatological: Skin is warm, dry, and supple bilateral, Nails 1-10 are tender, long, thick, and discolored with mild subungal debris, no webspace macerations present bilateral, no open lesions present bilateral, + minimal callus/hyperkeratotic tissue present plantar left> right hallux. No signs of infection bilateral.  Musculoskeletal: Asymptomatic hammertoe boney deformities noted  bilateral. Muscular strength within normal limits without painon range of motion. No pain with calf compression bilateral.  Assessment and Plan:  Problem List Items Addressed This Visit    None    Visit Diagnoses    Dermatophytosis of nail    -  Primary   Pain of toe, unspecified laterality       Neuropathy          -Examined patient.  -Discussed treatment options for painful callus and  mycotic nails. -Mechanically debrided and reduced mycotic nails with sterile nail nipper and dremel nail file without incident. -Recommend good supportive shoes for foot type -Recommend continue with vinegar soaks as needed and encouraged keeping nails filled to help with the thickness in between nail trims -Patient to return in 2.5 to 3 months for follow up evaluation or sooner if symptoms worsen.  Landis Martins, DPM

## 2017-02-22 DIAGNOSIS — M545 Low back pain: Secondary | ICD-10-CM | POA: Diagnosis not present

## 2017-02-22 DIAGNOSIS — I10 Essential (primary) hypertension: Secondary | ICD-10-CM | POA: Diagnosis not present

## 2017-02-27 ENCOUNTER — Ambulatory Visit: Payer: Medicare Other | Attending: Family Medicine

## 2017-02-27 DIAGNOSIS — M545 Low back pain, unspecified: Secondary | ICD-10-CM

## 2017-02-27 DIAGNOSIS — R293 Abnormal posture: Secondary | ICD-10-CM | POA: Diagnosis not present

## 2017-02-27 NOTE — Therapy (Signed)
[facility] [address] [place], [place], [address] Phone: [phone]   Fax:  [phone]  Physical Therapy Evaluation  Patient Details  Name: Shawna Lin MRN: [id number] Date of Birth: 03-Sep-1928 Referring Provider: London Pepper, MD  Encounter Date: 02/27/2017      PT End of Session - 02/27/17 1250    Visit Number 1   Number of Visits 10   Date for PT Re-Evaluation 04/24/17   PT Start Time 1210  25 minutes late   PT Stop Time 1231   PT Time Calculation (min) 21 min   Activity Tolerance Patient tolerated treatment well   Behavior During Therapy [facility] for tasks assessed/performed      Past Medical History:  Diagnosis Date  . Hypertension   . Memory loss   . Vitamin D deficiency     Past Surgical History:  Procedure Laterality Date  . UTERINE FIBROID SURGERY      There were no vitals filed for this visit.       Subjective Assessment - 02/27/17 1212    Subjective Pt reports to PT with complaint of LBP and Rt hip pain that began ~2 months ago.  Pt reports that pain is intermittent.    How long can you walk comfortably? unlimited with walker   Diagnostic tests none   Patient Stated Goals reduce pain, understand why I am having it.     Currently in Pain? Yes   Pain Score 0-No pain   Pain Location Back   Pain Orientation Left;Right;Lower   Pain Descriptors / Indicators Aching;Sore   Pain Type Acute pain   Pain Onset 1 to 4 weeks ago   Pain Frequency Intermittent   Aggravating Factors  unsure what causes it   Pain Relieving Factors apirin, relaxing            [facility] PT Assessment - 02/27/17 0001      Assessment   Medical Diagnosis LBP   Referring Provider London Pepper, MD   Onset Date/Surgical Date 02/11/17   Prior Therapy none     Precautions   Precautions Fall     Restrictions   Weight Bearing Restrictions No     Balance Screen   Has the patient fallen in the past 6 months No   Has the patient had a decrease in activity level because of a fear of falling?  No   Is the patient reluctant to leave their home because of a fear of falling?  No     Home Environment   Living Environment Private residence   Living Arrangements Alone   Type of [place] Access Level entry;Elevator   Home Layout One level   [place] - 4 wheels     Prior Function   Level of [place] Retired   Leisure walking     Cognition   Overall Cognitive Status Within Functional Limits for tasks assessed     Observation/Other Assessments   Focus on Therapeutic Outcomes (FOTO)  47% limitation     Posture/Postural Control   Posture/Postural Control Postural limitations   Postural Limitations Rounded Shoulders;Forward head;Flexed trunk     ROM / Strength   AROM / PROM / Strength AROM;PROM;Strength     AROM   Overall AROM  Within functional limits for tasks performed   Overall AROM Comments full lumbar A/ROM without pain today     PROM   Overall PROM  Within functional  limits for tasks performed   Overall PROM Comments bil hips     Strength   Overall Strength Within functional limits for tasks performed   Overall Strength Comments 4+/5 bil LE strength     Palpation   Spinal mobility reduced PA mobility in the thoracic and lumbar spine without pain   Palpation comment tension noted in bil lumbar paraspinals and gluteals without pain.      Transfers   Transfers Stand to Sit;Sit to Stand   Sit to Stand With upper extremity assist     Ambulation/Gait   Ambulation/Gait Yes   Ambulation/Gait Assistance 6: Modified independent (Device/Increase time)   Assistive device 4-wheeled walker   Gait Pattern Step-through pattern;Trunk flexed            Objective measurements completed on examination: See above findings.                    PT Short Term Goals - 02/27/17 1157      PT SHORT TERM GOAL #1   Title be  independent in initial HEP   Time 4   Period Weeks   Status New     PT SHORT TERM GOAL #2   Title report 40% reduction in the frequency and intensity of LBP with self-care and ADLs   Time 4   Period Weeks   Status New           PT Long Term Goals - 02/27/17 1158      PT LONG TERM GOAL #1   Title be independent in advanced HEP   Time 8   Period Weeks   Status New     PT LONG TERM GOAL #2   Title reduce FOTO to < or = to 37% limitation   Time 8   Period Weeks   Status New     PT LONG TERM GOAL #3   Title report a 75% reduction in the frequency and intensity of LBP with ADLs and self-care   Time 8   Period Weeks   Status New     PT LONG TERM GOAL #4   Title demonstrate upright posture and verbalize understanding of body mechanics modifications for lumbar protection   Time 8   Period Weeks   Status New                Plan - 02/27/17 1241    Clinical Impression Statement Pt presents to PT with complaints of LBP that began ~2 months ago without cause.  Pt without pain today and reports that pain as 5-6/10 intermittently.  Pt demonstrates poor posture with rounded shoulders and flexed trunk in standing.  Pt with reduced spinal mobility and tension in bil lumbar paraspinals.  Pt will benefit from skilled PT for core strength, postural strength, body mechanics education and manual/modalities as needed.     History and Personal Factors relevant to plan of care: HTN   Clinical Presentation Stable   Clinical Decision Making Low   Rehab Potential Good   PT Frequency 2x / week   PT Duration 8 weeks   PT Treatment/Interventions ADLs/Self Care Home Management;Cryotherapy;Electrical Stimulation;Functional mobility training;Ultrasound;Moist Heat;Therapeutic activities;Therapeutic exercise;Neuromuscular re-education;Patient/family education;Passive range of motion;Taping   PT Next Visit Plan Body mechanics education, core strength, postural education   Consulted and Agree  with Plan of Care Patient      Patient will benefit from skilled therapeutic intervention in order to improve the following deficits and impairments:  Postural dysfunction, Decreased  strength, Improper body mechanics, Pain  Visit Diagnosis: Acute bilateral low back pain without sciatica - Plan: PT plan of care cert/re-cert  Abnormal posture - Plan: PT plan of care cert/re-cert      G-Codes - 23/30/07 October 29, 1230    Functional Assessment Tool Used (Outpatient Only) 47% limitation   Functional Limitation Other PT primary   Other PT Primary Current Status (M2263) At least 40 percent but less than 60 percent impaired, limited or restricted   Other PT Primary Goal Status (F3545) At least 20 percent but less than 40 percent impaired, limited or restricted       Problem List Patient Active Problem List   Diagnosis Date Noted  . Syncope 12/21/2015  . Dementia 12/21/2015  . No blood products 12/21/2015  . 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 Memory loss 09/02/2015  . HTN (hypertension) 09/02/2015     Sigurd Sos, PT 02/27/17 12:54 PM  [facility] [address] [place], [place], [address] Phone: [phone]   Fax:  [phone]  Name: Shawna Lin MRN: [id number] Date of Birth: 30-Apr-1929

## 2017-03-12 ENCOUNTER — Ambulatory Visit: Payer: Medicare Other

## 2017-03-12 DIAGNOSIS — R293 Abnormal posture: Secondary | ICD-10-CM

## 2017-03-12 DIAGNOSIS — M545 Low back pain, unspecified: Secondary | ICD-10-CM

## 2017-03-12 NOTE — Therapy (Signed)
[facility] [address] [place], [place], [address] Phone: [phone]   Fax:  [phone]  Physical Therapy Treatment  Patient Details  Name: Shawna Lin MRN: [id number] Date of Birth: 1928/12/02 Referring Provider: London Pepper, MD  Encounter Date: 03/12/2017      PT End of Session - 03/12/17 1512    Visit Number 2   Number of Visits 10   Date for PT Re-Evaluation 04/24/17   PT Start Time 5885   PT Stop Time 1530   PT Time Calculation (min) 45 min   Activity Tolerance Patient tolerated treatment well   Behavior During Therapy [facility] for tasks assessed/performed      Past Medical History:  Diagnosis Date  . Hypertension   . Memory loss   . Vitamin D deficiency     Past Surgical History:  Procedure Laterality Date  . UTERINE FIBROID SURGERY      There were no vitals filed for this visit.      Subjective Assessment - 03/12/17 1450    Subjective Pt. doing well today.     Patient Stated Goals reduce pain, understand why I am having it.     Currently in Pain? No/denies   Pain Score 0-No pain   Pain Location --   Pain Orientation --   Pain Descriptors / Indicators --   Pain Type --   Pain Onset --                         [facility] Adult PT Treatment/Exercise - 03/12/17 1517      Knee/Hip Exercises: Stretches   Other Knee/Hip Stretches LTR x 5 reps 10" hold      Knee/Hip Exercises: Aerobic   Nustep Lvl 1, 9 min      Knee/Hip Exercises: Standing   Heel Raises Both;15 reps;3 seconds   Knee Flexion Right;Left;10 reps   Knee Flexion Limitations 2#    Hip Flexion Right;Left;15 reps;Knee straight   Hip Flexion Limitations counter    Hip Abduction Right;Left;15 reps   Abduction Limitations counter    Hip Extension Right;Left;15 reps;Knee straight   Extension Limitations counter    Other Standing Knee Exercises Alternating toe-tap to 8" step x 15 reps each side; 1 UE support from  therapist     Knee/Hip Exercises: Seated   Long Arc Quad Right;Left;10 reps   Long Arc Quad Limitations 2#    Sit to [employer] 1 set;10 reps;without UE support  standing on airex pad                 PT Education - 03/12/17 1741    Education provided Yes   Education Details Standing hip flexion, extension, abduction, LTR, heel raise at counter top    Person(s) Educated Patient   Methods Explanation;Demonstration;Verbal cues;Handout   Comprehension Verbalized understanding;Returned demonstration;Verbal cues required;Need further instruction          PT Short Term Goals - 03/12/17 1515      PT SHORT TERM GOAL #1   Title be independent in initial HEP   Time 4   Period Weeks   Status On-going     PT SHORT TERM GOAL #2   Title report 40% reduction in the frequency and intensity of LBP with self-care and ADLs   Time 4   Period Weeks   Status On-going           PT Long Term Goals - 03/12/17 0277  PT LONG TERM GOAL #1   Title be independent in advanced HEP   Time 8   Period Weeks   Status On-going     PT LONG TERM GOAL #2   Title reduce FOTO to < or = to 37% limitation   Time 8   Period Weeks   Status On-going     PT LONG TERM GOAL #3   Title report a 75% reduction in the frequency and intensity of LBP with ADLs and self-care   Time 8   Period Weeks   Status On-going     PT LONG TERM GOAL #4   Title demonstrate upright posture and verbalize understanding of body mechanics modifications for lumbar protection   Time 8   Period Weeks   Status On-going               Plan - 03/12/17 1516    Clinical Impression Statement Pt. seen to start treatment with RW doing well.  Denied LBP today throughout treatment.  Tolerated light warm-up on NuStep and conservative LE strengthening and balance activities well today.  Basic balance and strengthening HEP issued to pt. today with pt. able to perform all activities well in treatment.  Will monitor tolerance to  HEP in coming visits.   PT Treatment/Interventions ADLs/Self Care Home Management;Cryotherapy;Electrical Stimulation;Functional mobility training;Ultrasound;Moist Heat;Therapeutic activities;Therapeutic exercise;Neuromuscular re-education;Patient/family education;Passive range of motion;Taping   PT Next Visit Plan Body mechanics education, core strength, postural education      Patient will benefit from skilled therapeutic intervention in order to improve the following deficits and impairments:  Postural dysfunction, Decreased strength, Improper body mechanics, Pain  Visit Diagnosis: Acute bilateral low back pain without sciatica  Abnormal posture     Problem List Patient Active Problem List   Diagnosis Date Noted  . Syncope 12/21/2015  . Dementia 12/21/2015  . No blood products 12/21/2015  . 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 Memory loss 09/02/2015  . HTN (hypertension) 09/02/2015    Bess Harvest, PTA 03/12/17 5:45 PM  [facility] [address] [place], [place], [address] Phone: [phone]   Fax:  [phone]  Name: Shawna Lin MRN: [id number] Date of Birth: [birth year]

## 2017-03-14 ENCOUNTER — Encounter: Payer: Medicare Other | Admitting: Physical Therapy

## 2017-03-19 ENCOUNTER — Telehealth: Payer: Self-pay | Admitting: Physical Therapy

## 2017-03-19 ENCOUNTER — Ambulatory Visit: Payer: Medicare Other | Attending: Family Medicine | Admitting: Physical Therapy

## 2017-03-19 DIAGNOSIS — M545 Low back pain: Secondary | ICD-10-CM | POA: Insufficient documentation

## 2017-03-19 DIAGNOSIS — R293 Abnormal posture: Secondary | ICD-10-CM | POA: Insufficient documentation

## 2017-03-19 NOTE — Telephone Encounter (Signed)
Called patient's home number due to no show.  No answer and no voicemail, so PT unable to leave message.  Zannie Cove, PT 03/19/17 4:02 PM

## 2017-03-22 ENCOUNTER — Encounter: Payer: Medicare Other | Admitting: Physical Therapy

## 2017-03-26 ENCOUNTER — Encounter: Payer: Self-pay | Admitting: Physical Therapy

## 2017-03-26 ENCOUNTER — Ambulatory Visit: Payer: Medicare Other | Admitting: Physical Therapy

## 2017-03-26 DIAGNOSIS — M545 Low back pain, unspecified: Secondary | ICD-10-CM

## 2017-03-26 DIAGNOSIS — R293 Abnormal posture: Secondary | ICD-10-CM | POA: Diagnosis not present

## 2017-03-26 NOTE — Therapy (Signed)
[facility] [address], [place], [place], [address] Phone: [phone]   Fax:  [phone]  Physical Therapy Treatment  Patient Details  Name: Shawna Lin MRN: [id number] Date of Birth: 08-Aug-1928 Referring Provider: London Pepper, MD  Encounter Date: 03/26/2017      PT End of Session - 03/26/17 1405    Visit Number 3   Number of Visits 10   Date for PT Re-Evaluation 04/24/17   PT Start Time 1401   PT Stop Time 1440   PT Time Calculation (min) 39 min   Activity Tolerance Patient tolerated treatment well   Behavior During Therapy [facility] for tasks assessed/performed      Past Medical History:  Diagnosis Date  . Hypertension   . Memory loss   . Vitamin D deficiency     Past Surgical History:  Procedure Laterality Date  . UTERINE FIBROID SURGERY      There were no vitals filed for this visit.      Subjective Assessment - 03/26/17 1403    Subjective Pt states she is doing well.  No complaints   How long can you walk comfortably? unlimited with walker   Diagnostic tests none   Patient Stated Goals reduce pain, understand why I am having it.     Currently in Pain? No/denies                         [facility] Adult PT Treatment/Exercise - 03/26/17 0001      Lumbar Exercises: Seated   Other Seated Lumbar Exercises seated with verbal cues for posture and shoulder ER - 20x     Knee/Hip Exercises: Stretches   Active Hamstring Stretch 3 reps;20 seconds   Gastroc Stretch 3 reps;20 seconds     Knee/Hip Exercises: Aerobic   Nustep Lvl 1, 10 min      Knee/Hip Exercises: Standing   Heel Raises Both;15 reps;3 seconds   Knee Flexion Right;Left;10 reps  standing on foam mat near TM   Knee Flexion Limitations 2#    Hip Flexion Right;Left;15 reps;Knee straight  standing on foam mat near TM   Hip Abduction Right;Left;15 reps  standing on foam mat near TM   Hip Extension Right;Left;15 reps;Knee  straight  standing on foam mat near TM   Forward Step Up Right;Left;15 reps;Hand Hold: 2;Step Height: 6"     Knee/Hip Exercises: Seated   Long Arc Quad Right;Left;2 sets;15 reps   Long Arc Quad Weight 2 lbs.   Knee/Hip Flexion 20x bilat - 2 lb   Sit to [place] 1 set;10 reps;without UE support  standing on airex pad                   PT Short Term Goals - 03/26/17 1406      PT SHORT TERM GOAL #1   Title be independent in initial HEP   Time 4   Period Weeks   Status On-going     PT SHORT TERM GOAL #2   Title report 40% reduction in the frequency and intensity of LBP with self-care and ADLs   Time 4   Period Weeks   Status On-going           PT Long Term Goals - 03/12/17 1516      PT LONG TERM GOAL #1   Title be independent in advanced HEP   Time 8   Period Weeks   Status On-going  PT LONG TERM GOAL #2   Title reduce FOTO to < or = to 37% limitation   Time 8   Period Weeks   Status On-going     PT LONG TERM GOAL #3   Title report a 75% reduction in the frequency and intensity of LBP with ADLs and self-care   Time 8   Period Weeks   Status On-going     PT LONG TERM GOAL #4   Title demonstrate upright posture and verbalize understanding of body mechanics modifications for lumbar protection   Time 8   Period Weeks   Status On-going               Plan - 03/26/17 1405    Clinical Impression Statement Pt denies any low back pain.  Pt did well with addition of foam mat to standing and sit to stand exercises.  She needs cues throughout for posture.  Pt will benefit from skilled PT for improved strength, posture and balance.     Rehab Potential Good   PT Treatment/Interventions ADLs/Self Care Home Management;Cryotherapy;Electrical Stimulation;Functional mobility training;Ultrasound;Moist Heat;Therapeutic activities;Therapeutic exercise;Neuromuscular re-education;Patient/family education;Passive range of motion;Taping   PT Next Visit Plan Body  mechanics education, core strength, postural education   Consulted and Agree with Plan of Care Patient      Patient will benefit from skilled therapeutic intervention in order to improve the following deficits and impairments:  Postural dysfunction, Decreased strength, Improper body mechanics, Pain  Visit Diagnosis: Acute bilateral low back pain without sciatica  Abnormal posture     Problem List Patient Active Problem List   Diagnosis Date Noted  . Syncope 12/21/2015  . Dementia 12/21/2015  . No blood products 12/21/2015  . 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 Memory loss 09/02/2015  . HTN (hypertension) 09/02/2015    Zannie Cove, PT 03/26/2017, 2:36 PM  [facility] [address], [place], [place], [address] Phone: [phone]   Fax:  [phone]  Name: Shawna Lin MRN: [id number] Date of Birth: October 09, 1928

## 2017-03-29 ENCOUNTER — Encounter: Payer: Medicare Other | Admitting: Physical Therapy

## 2017-04-01 ENCOUNTER — Encounter: Payer: Medicare Other | Admitting: Physical Therapy

## 2017-04-03 ENCOUNTER — Encounter: Payer: Medicare Other | Admitting: Physical Therapy

## 2017-04-03 ENCOUNTER — Ambulatory Visit: Payer: Medicare Other

## 2017-04-03 DIAGNOSIS — M545 Low back pain, unspecified: Secondary | ICD-10-CM

## 2017-04-03 DIAGNOSIS — R293 Abnormal posture: Secondary | ICD-10-CM

## 2017-04-03 NOTE — Patient Instructions (Addendum)
   Lifting Principles  Maintain proper posture and head alignment. Slide object as close as possible before lifting. Move obstacles out of the way. Test before lifting; ask for help if too heavy. Tighten stomach muscles without holding breath. Use smooth movements; do not jerk. Use legs to do the work, and pivot with feet. Distribute the work load symmetrically and close to the center of trunk. Push instead of pull whenever possible.   Squat down and hold basket close to stand. Use leg muscles to do the work.    Avoid twisting or bending back. Pivot around using foot movements, and bend at knees if needed when reaching for articles.        Getting Into / Out of Bed   Lower self to lie down on one side by raising legs and lowering head at the same time. Use arms to assist moving without twisting. Bend both knees to roll onto back if desired. To sit up, start from lying on side, and use same move-ments in reverse. Keep trunk aligned with legs.    Shift weight from front foot to back foot as item is lifted off shelf.    When leaning forward to pick object up from floor, extend one leg out behind. Keep back straight. Hold onto a sturdy support with other hand.      Sit upright, head facing forward. Try using a roll to support lower back. Keep shoulders relaxed, and avoid rounded back. Keep hips level with knees. Avoid crossing legs for long periods.     [facility] [address], [address] [place], [place] [address] Phone # [phone] Fax [phone]  

## 2017-04-03 NOTE — Therapy (Signed)
[facility] [address] [place], [place], [address] Phone: [phone]   Fax:  [phone]  Physical Therapy Treatment  Patient Details  Name: Shawna Lin MRN: [id number] Date of Birth: July 17, 1928 Referring Provider: London Pepper, MD  Encounter Date: 04/03/2017      PT End of Session - 04/03/17 1602    Visit Number 4   Number of Visits 10   Date for PT Re-Evaluation 04/24/17   PT Start Time 1525   PT Stop Time 1604   PT Time Calculation (min) 39 min   Activity Tolerance Patient tolerated treatment well   Behavior During Therapy [facility] for tasks assessed/performed      Past Medical History:  Diagnosis Date  . Hypertension   . Memory loss   . Vitamin D deficiency     Past Surgical History:  Procedure Laterality Date  . UTERINE FIBROID SURGERY      There were no vitals filed for this visit.      Subjective Assessment - 04/03/17 1533    Subjective No pain at all since last visit.  I have been walking a lot.     Currently in Pain? No/denies                         [facility] Adult PT Treatment/Exercise - 04/03/17 0001      Knee/Hip Exercises: Stretches   Active Hamstring Stretch 3 reps;20 seconds     Knee/Hip Exercises: Aerobic   Nustep Lvl 1, 10 min   PT present to discuss progress     Knee/Hip Exercises: Standing   Heel Raises Both;15 reps;3 seconds   Knee Flexion Right;Left;10 reps  standing on foam mat near TM   Knee Flexion Limitations 2#    Hip Flexion Right;Left;15 reps;Knee straight  standing on foam mat near TM   Hip Abduction Right;Left;15 reps  standing on foam mat near TM   Hip Extension Right;Left;15 reps;Knee straight  standing on foam mat near TM   Forward Step Up Right;Left;15 reps;Hand Hold: 2;Step Height: 6"     Knee/Hip Exercises: Seated   Long Arc Quad Right;Left;2 sets;15 reps   Long Arc Quad Weight 2 lbs.   Knee/Hip Flexion 20x bilat - 2 lb                 PT Education - 04/03/17 1535    Education provided Yes   Education Details body mechanics education   Person(s) Educated Patient   Methods Explanation;Demonstration;Handout   Comprehension Verbalized understanding;Returned demonstration          PT Short Term Goals - 04/03/17 1533      PT SHORT TERM GOAL #1   Title be independent in initial HEP   Status Achieved     PT SHORT TERM GOAL #2   Title report 40% reduction in the frequency and intensity of LBP with self-care and ADLs   Baseline at least 50%   Status Achieved           PT Long Term Goals - 04/03/17 1534      PT LONG TERM GOAL #1   Title be independent in advanced HEP   Time 8   Period Weeks   Status On-going     PT LONG TERM GOAL #4   Title demonstrate upright posture and verbalize understanding of body mechanics modifications for lumbar protection  Plan - 04/03/17 1543    Clinical Impression Statement Pt denies any LBP.  PT focused on LE strength, core strength and balance.  Pt requires verbal cues for posture during activity.  Pt received body mechanics education today.  Pt will benefit from PT for strength, flexibility and balance exercises.     Rehab Potential Good   PT Frequency 2x / week   PT Duration 8 weeks   PT Treatment/Interventions ADLs/Self Care Home Management;Cryotherapy;Electrical Stimulation;Functional mobility training;Ultrasound;Moist Heat;Therapeutic activities;Therapeutic exercise;Neuromuscular re-education;Patient/family education;Passive range of motion;Taping   PT Next Visit Plan Core strength, balance. 1-2 more sessions as long as no pain   Consulted and Agree with Plan of Care Patient      Patient will benefit from skilled therapeutic intervention in order to improve the following deficits and impairments:  Postural dysfunction, Decreased strength, Improper body mechanics, Pain  Visit Diagnosis: Acute bilateral low back pain without  sciatica  Abnormal posture     Problem List Patient Active Problem List   Diagnosis Date Noted  . Syncope 12/21/2015  . Dementia 12/21/2015  . No blood products 12/21/2015  . 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 Memory loss 09/02/2015  . HTN (hypertension) 09/02/2015     Shawna Lin, PT 04/03/17 4:04 PM  [facility] [address] [place], [place], [address] Phone: [phone]   Fax:  [phone]  Name: Shawna Lin MRN: [id number] Date of Birth: 10/25/1928

## 2017-04-05 DIAGNOSIS — Z23 Encounter for immunization: Secondary | ICD-10-CM | POA: Diagnosis not present

## 2017-04-09 ENCOUNTER — Encounter: Payer: Self-pay | Admitting: Physical Therapy

## 2017-04-09 ENCOUNTER — Ambulatory Visit: Payer: Medicare Other | Admitting: Physical Therapy

## 2017-04-09 DIAGNOSIS — M545 Low back pain, unspecified: Secondary | ICD-10-CM

## 2017-04-09 DIAGNOSIS — R293 Abnormal posture: Secondary | ICD-10-CM | POA: Diagnosis not present

## 2017-04-09 NOTE — Therapy (Signed)
[facility] [address], [place], [place], [address] Phone: [phone]   Fax:  [phone]  Physical Therapy Treatment  Patient Details  Name: Shawna Lin MRN: [id number] Date of Birth: [birth year] Referring Provider: London Pepper, MD  Encounter Date: 04/09/2017      PT End of Session - 04/09/17 1446    Visit Number 5   Number of Visits 10   Date for PT Re-Evaluation 04/24/17   PT Start Time 2119   PT Stop Time 1527   PT Time Calculation (min) 45 min   Activity Tolerance Patient tolerated treatment well   Behavior During Therapy [facility] for tasks assessed/performed      Past Medical History:  Diagnosis Date  . Hypertension   . Memory loss   . Vitamin D deficiency     Past Surgical History:  Procedure Laterality Date  . UTERINE FIBROID SURGERY      There were no vitals filed for this visit.                       [facility] Adult PT Treatment/Exercise - 04/09/17 0001      Neuro Re-ed    Neuro Re-ed Details  standing on foam mat with engaged core for postural strength and stability     Knee/Hip Exercises: Stretches   Active Hamstring Stretch 3 reps;20 seconds   Hip Flexor Stretch Right;3 reps;20 seconds  in side lying PT assist   Piriformis Stretch Right;Left;3 reps;20 seconds     Knee/Hip Exercises: Aerobic   Nustep Lvl 1, 10 min   PT present to discuss progress     Knee/Hip Exercises: Standing   Heel Raises Both;15 reps;3 seconds   Knee Flexion Right;Left;10 reps  standing on foam mat near TM   Knee Flexion Limitations 2#    Hip Flexion Right;Left;15 reps;Knee straight  standing on foam mat near TM   Hip Flexion Limitations 2#   Hip Abduction Right;Left;15 reps  standing on foam mat near TM   Abduction Limitations 2#   Hip Extension Right;Left;15 reps;Knee straight  standing on foam mat near TM, 2#   Extension Limitations 2#   Forward Step Up Right;Left;15 reps;Hand Hold:  2;Step Height: 6"     Knee/Hip Exercises: Seated   Long Arc Quad Right;Left;2 sets;15 reps   Long Arc Quad Weight 2 lbs.   Knee/Hip Flexion 20x bilat - 2 lb                  PT Short Term Goals - 04/03/17 1533      PT SHORT TERM GOAL #1   Title be independent in initial HEP   Status Achieved     PT SHORT TERM GOAL #2   Title report 40% reduction in the frequency and intensity of LBP with self-care and ADLs   Baseline at least 50%   Status Achieved           PT Long Term Goals - 04/09/17 1532      PT LONG TERM GOAL #1   Title be independent in advanced HEP   Time 8   Period Weeks   Status On-going     PT LONG TERM GOAL #2   Title reduce FOTO to < or = to 37% limitation   Time 8   Period Weeks   Status On-going     PT LONG TERM GOAL #3   Title report a 75% reduction in the frequency and  intensity of LBP with ADLs and self-care   Time 8   Period Weeks   Status On-going     PT LONG TERM GOAL #4   Title demonstrate upright posture and verbalize understanding of body mechanics modifications for lumbar protection   Time 8   Period Weeks   Status On-going               Plan - 04/09/17 1448    Clinical Impression Statement Pt reports some right hip pain and down into the right thigh today during some of the standing exercises.  She felt better after LE stretches.  Pt continues to need some cues for posture during exercise.  She continues to benefit from skilled PT for improved posture and core strength   PT Treatment/Interventions ADLs/Self Care Home Management;Cryotherapy;Electrical Stimulation;Functional mobility training;Ultrasound;Moist Heat;Therapeutic activities;Therapeutic exercise;Neuromuscular re-education;Patient/family education;Passive range of motion;Taping   PT Next Visit Plan Core strength, balance. 2 more sessions as long as no pain   Consulted and Agree with Plan of Care Patient      Patient will benefit from skilled therapeutic  intervention in order to improve the following deficits and impairments:  Postural dysfunction, Decreased strength, Improper body mechanics, Pain  Visit Diagnosis: Acute bilateral low back pain without sciatica  Abnormal posture     Problem List Patient Active Problem List   Diagnosis Date Noted  . Syncope 12/21/2015  . Dementia 12/21/2015  . No blood products 12/21/2015  . 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 Memory loss 09/02/2015  . HTN (hypertension) 09/02/2015    Zannie Cove, PT 04/09/2017, 3:34 PM  [facility] [address] [place], [place], [address] Phone: [phone]   Fax:  [phone]  Name: Shawna Lin MRN: [id number] Date of Birth: 1929/03/10

## 2017-04-11 ENCOUNTER — Ambulatory Visit: Payer: Medicare Other | Admitting: Physical Therapy

## 2017-04-11 DIAGNOSIS — M545 Low back pain, unspecified: Secondary | ICD-10-CM

## 2017-04-11 DIAGNOSIS — R293 Abnormal posture: Secondary | ICD-10-CM

## 2017-04-11 NOTE — Therapy (Signed)
[facility] [address], [place], [place], [address] Phone: [phone]   Fax:  [phone]  Physical Therapy Treatment  Patient Details  Name: Shawna Lin MRN: [id number] Date of Birth: Sep 07, 1928 Referring Provider: London Pepper, MD  Encounter Date: 04/11/2017      PT End of Session - 04/11/17 1454    Visit Number 6   Number of Visits 10   Date for PT Re-Evaluation 04/24/17   PT Start Time 9892   PT Stop Time 1525   PT Time Calculation (min) 38 min   Activity Tolerance Patient tolerated treatment well;No increased pain   Behavior During Therapy WFL for tasks assessed/performed      Past Medical History:  Diagnosis Date  . Hypertension   . Memory loss   . Vitamin D deficiency     Past Surgical History:  Procedure Laterality Date  . UTERINE FIBROID SURGERY      There were no vitals filed for this visit.      Subjective Assessment - 04/11/17 1453    Subjective Pt reports having no pain currently. She has no concerns at this time.    Currently in Pain? No/denies                     [facility] Adult PT Treatment/Exercise - 04/11/17 0001      Exercises   Exercises Other Exercises   Other Exercises  seated low rows with green TB 2x15 reps; horizontal abduction 2x15 reps     Knee/Hip Exercises: Stretches   Piriformis Stretch Both;20 seconds;2 reps   Piriformis Stretch Limitations seated     Knee/Hip Exercises: Aerobic   Nustep L1 x4 min, L2 x4 min     Knee/Hip Exercises: Standing   Knee Flexion Both;2 sets;10 reps   Knee Flexion Limitations 3#   Hip Extension Both;2 sets;10 reps   Extension Limitations 3#     Knee/Hip Exercises: Seated   Long Arc Quad Both;2 sets;15 reps   Long Arc Quad Weight 3 lbs.   Clamshell with Marga Hoots  2x15 reps                 PT Education - 04/11/17 1500    Education provided Yes   Education Details technique with therex; benefits of  findings a place to complete daily walking   Person(s) Educated Patient   Methods Explanation;Verbal cues   Comprehension Verbalized understanding          PT Short Term Goals - 04/03/17 1533      PT SHORT TERM GOAL #1   Title be independent in initial HEP   Status Achieved     PT SHORT TERM GOAL #2   Title report 40% reduction in the frequency and intensity of LBP with self-care and ADLs   Baseline at least 50%   Status Achieved           PT Long Term Goals - 04/09/17 1532      PT LONG TERM GOAL #1   Title be independent in advanced HEP   Time 8   Period Weeks   Status On-going     PT LONG TERM GOAL #2   Title reduce FOTO to < or = to 37% limitation   Time 8   Period Weeks   Status On-going     PT LONG TERM GOAL #3   Title report a 75% reduction in the frequency and intensity of LBP with ADLs and  self-care   Time 8   Period Weeks   Status On-going     PT LONG TERM GOAL #4   Title demonstrate upright posture and verbalize understanding of body mechanics modifications for lumbar protection   Time 8   Period Weeks   Status On-going               Plan - 04/11/17 1502    Clinical Impression Statement Pt continues to report being pain free upon arrival. Session focused on progressing LE strength and stability, pt able to complete with increased reps and resistance without any reported difficulty. She did require supervision and direction to complete therex with proper technique. Ended session discussing possible upcoming discharge due to pt's performance lately. She verbalized understanding and agreement with this.       PT Treatment/Interventions ADLs/Self Care Home Management;Cryotherapy;Electrical Stimulation;Functional mobility training;Ultrasound;Moist Heat;Therapeutic activities;Therapeutic exercise;Neuromuscular re-education;Patient/family education;Passive range of motion;Taping   PT Next Visit Plan possible d/c   Consulted and Agree with Plan of  Care Patient      Patient will benefit from skilled therapeutic intervention in order to improve the following deficits and impairments:  Postural dysfunction, Decreased strength, Improper body mechanics, Pain  Visit Diagnosis: Acute bilateral low back pain without sciatica  Abnormal posture     Problem List Patient Active Problem List   Diagnosis Date Noted  . Syncope 12/21/2015  . Dementia 12/21/2015  . No blood products 12/21/2015  . 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 Memory loss 09/02/2015  . HTN (hypertension) 09/02/2015    3:26 PM,04/11/17 Elly Modena PT, DPT [facility] [address], [place], [place], [address] Phone: [phone]   Fax:  [phone]  Name: Shawna Lin MRN: [id number] Date of Birth: 20-Oct-1928

## 2017-04-12 ENCOUNTER — Encounter: Payer: Medicare Other | Admitting: Physical Therapy

## 2017-04-23 ENCOUNTER — Ambulatory Visit: Payer: Medicare Other | Attending: Family Medicine | Admitting: Physical Therapy

## 2017-04-23 DIAGNOSIS — M545 Low back pain, unspecified: Secondary | ICD-10-CM

## 2017-04-23 DIAGNOSIS — R293 Abnormal posture: Secondary | ICD-10-CM | POA: Insufficient documentation

## 2017-04-23 NOTE — Therapy (Signed)
[facility] [address] [place], [place], [address] Phone: [phone]   Fax:  [phone]  Physical Therapy Treatment  Patient Details  Name: Shawna Lin MRN: [id number] Date of Birth: 26-Oct-1928 Referring Provider: London Pepper, MD  Encounter Date: 04/23/2017      PT End of Session - 04/23/17 1236    Visit Number 7   Number of Visits 10   Date for PT Re-Evaluation 04/24/17   PT Start Time 1232   PT Stop Time 1311   PT Time Calculation (min) 39 min   Activity Tolerance Patient tolerated treatment well;No increased pain   Behavior During Therapy WFL for tasks assessed/performed      Past Medical History:  Diagnosis Date  . Hypertension   . Memory loss   . Vitamin D deficiency     Past Surgical History:  Procedure Laterality Date  . UTERINE FIBROID SURGERY      There were no vitals filed for this visit.      Subjective Assessment - 04/23/17 1311    Subjective Pt states she has not having any pain and is able to do exercises at home when she needs to.   Currently in Pain? No/denies                         [facility] Adult PT Treatment/Exercise - 04/23/17 0001      Knee/Hip Exercises: Aerobic   Nustep L1 x4 min, L2 x4 min     Knee/Hip Exercises: Standing   Knee Flexion Both;2 sets;10 reps   Knee Flexion Limitations 3#   Hip Flexion Right;Left;15 reps;Knee straight   Hip Flexion Limitations 3#   Hip Abduction Right;Left;15 reps  standing on foam mat near TM   Abduction Limitations 3#   Hip Extension Both;2 sets;10 reps   Extension Limitations 3#     Knee/Hip Exercises: Seated   Long Arc Quad Both;2 sets;15 reps   Long Arc Quad Weight 3 lbs.   Clamshell with Marga Hoots  2x15 reps                   PT Short Term Goals - 04/03/17 1533      PT SHORT TERM GOAL #1   Title be independent in initial HEP   Status Achieved     PT SHORT TERM GOAL #2   Title  report 40% reduction in the frequency and intensity of LBP with self-care and ADLs   Baseline at least 50%   Status Achieved           PT Long Term Goals - 04/23/17 1310      PT LONG TERM GOAL #1   Title be independent in advanced HEP   Time 8   Period Weeks   Status Achieved     PT LONG TERM GOAL #2   Title reduce FOTO to < or = to 37% limitation   Baseline 4% limited today   Time 8   Period Weeks   Status Achieved     PT LONG TERM GOAL #3   Title report a 75% reduction in the frequency and intensity of LBP with ADLs and self-care   Baseline no pain   Time 8   Period Weeks   Status Achieved     PT LONG TERM GOAL #4   Title demonstrate upright posture and verbalize understanding of body mechanics modifications for lumbar protection   Baseline "you have to  walk tall"   Time 8   Period Weeks   Status Achieved               Plan - 05/06/2017 1307    Clinical Impression Statement Patient has improved with no pain at this time and is independent with HEP.  Pt will discharge from skilled therapy today   Rehab Potential Good   PT Treatment/Interventions ADLs/Self Care Home Management;Cryotherapy;Electrical Stimulation;Functional mobility training;Ultrasound;Moist Heat;Therapeutic activities;Therapeutic exercise;Neuromuscular re-education;Patient/family education;Passive range of motion;Taping   PT Next Visit Plan discharge today   Consulted and Agree with Plan of Care Patient      Patient will benefit from skilled therapeutic intervention in order to improve the following deficits and impairments:  Postural dysfunction, Decreased strength, Improper body mechanics, Pain  Visit Diagnosis: Acute bilateral low back pain without sciatica  Abnormal posture       G-Codes - May 06, 2017 1319    Functional Assessment Tool Used (Outpatient Only) FOTO   Functional Limitation Other PT primary   Other PT Primary Current Status (F6671) At least 1 percent but less than 20  percent impaired, limited or restricted   Other PT Primary Goal Status (V7956) At least 20 percent but less than 40 percent impaired, limited or restricted   Other PT Primary Discharge Status (A6290) At least 1 percent but less than 20 percent impaired, limited or restricted      Problem List Patient Active Problem List   Diagnosis Date Noted  . Syncope 12/21/2015  . Dementia 12/21/2015  . No blood products 12/21/2015  . 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 Memory loss 09/02/2015  . HTN (hypertension) 09/02/2015    Zannie Cove 2017-05-06, 1:19 PM  [facility] [address] [place], [place], [address] Phone: [phone]   Fax:  [phone]  Name: Shawna Lin MRN: [id number] Date of Birth: Dec 02, 1928  PHYSICAL THERAPY DISCHARGE SUMMARY  Visits from Start of Care:7  Current functional level related to goals / functional outcomes: See above   Remaining deficits: See above   Education / Equipment: HEP  Plan: Patient agrees to discharge.  Patient goals were met. Patient is being discharged due to meeting the stated rehab goals.  ?????         [employer], PT 2017/05/06 1:19 PM

## 2017-05-28 ENCOUNTER — Encounter: Payer: Self-pay | Admitting: Sports Medicine

## 2017-05-28 ENCOUNTER — Telehealth: Payer: Self-pay | Admitting: Sports Medicine

## 2017-05-28 ENCOUNTER — Ambulatory Visit (INDEPENDENT_AMBULATORY_CARE_PROVIDER_SITE_OTHER): Payer: Medicare Other | Admitting: Sports Medicine

## 2017-05-28 DIAGNOSIS — M79676 Pain in unspecified toe(s): Secondary | ICD-10-CM

## 2017-05-28 DIAGNOSIS — B351 Tinea unguium: Secondary | ICD-10-CM

## 2017-05-28 DIAGNOSIS — I739 Peripheral vascular disease, unspecified: Secondary | ICD-10-CM

## 2017-05-28 DIAGNOSIS — G629 Polyneuropathy, unspecified: Secondary | ICD-10-CM

## 2017-05-28 DIAGNOSIS — Q828 Other specified congenital malformations of skin: Secondary | ICD-10-CM

## 2017-05-28 NOTE — Telephone Encounter (Signed)
Attempted to reach patient but no voicemail was set up. Patient lost ring in office and was found by another patient after she had already left. Patient's ring is up at the front desk.

## 2017-05-28 NOTE — Progress Notes (Signed)
Patient ID: Solae Norling, female   DOB: August 27, 1928, 83 y.o.   MRN: [id number] Subjective: Celester Morgan is a 83 y.o. female patient seen today in office with complaint of painful thickened and elongated toenails; unable to trim. Patient denies any changes since last visit with medical history. Patient has no other pedal complaints at this time.   Patient Active Problem List   Diagnosis Date Noted  . Syncope 12/21/2015  . Dementia 12/21/2015  . No blood products 12/21/2015  . 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 Memory loss 09/02/2015  . HTN (hypertension) 09/02/2015    Current Outpatient Medications on File Prior to Visit  Medication Sig Dispense Refill  . Aspirin 81 MG EC tablet Take 81 mg by mouth daily.    Marland Kitchen donepezil (ARICEPT) 10 MG tablet TAKE ONE TABLET BY MOUTH AT BEDTIME 90 tablet 3  . hydrochlorothiazide (HYDRODIURIL) 12.5 MG tablet Take 12.5 mg by mouth daily.    . memantine (NAMENDA) 5 MG tablet Take 1 tablet (5 mg total) by mouth 2 (two) times daily. 60 tablet 6   No current facility-administered medications on file prior to visit.     No Known Allergies  Objective: Physical Exam  General: Well developed, nourished, no acute distress, awake, alert and oriented x 3  Vascular: Dorsalis pedis artery 1/4 bilateral, Posterior tibial artery 0/4 bilateral, skin temperature warm to warm proximal to distal bilateral lower extremities, trace edema at ankles bilateral, no varicosities, scant pedal hair present bilateral.  Neurological: Gross sensation present via light touch bilateral. Monofilament decreased bilateral.  Dermatological: Skin is warm, dry, and supple bilateral, Nails 1-10 are tender, long, thick, and discolored with mild subungal debris, no webspace macerations present bilateral, no open lesions present bilateral, + minimal callus/hyperkeratotic tissue present plantar left> right hallux. No signs of infection bilateral.  Musculoskeletal: Asymptomatic hammertoe boney  deformities noted bilateral. Muscular strength within normal limits without painon range of motion. No pain with calf compression bilateral.  Assessment and Plan:  Problem List Items Addressed This Visit    None    Visit Diagnoses    Dermatophytosis of nail    -  Primary   Porokeratosis       Pain of toe, unspecified laterality       Neuropathy       PVD (peripheral vascular disease) ([facility])         -Examined patient.  -Discussed treatment options for painful callus and  mycotic nails. -Mechanically debrided and reduced mycotic nails with sterile nail nipper and dremel nail file without incident. -Recommend continue with good supportive shoes for foot type and also to help prevent worsening of callus -Recommend elevation to assist with edema control -Recommend tea tree oil to nails and vinegar soaks as needed for nail fungus  -Patient to return in 2.5 to 3 months for follow up evaluation or sooner if symptoms worsen.  Landis Martins, DPM

## 2017-06-18 ENCOUNTER — Ambulatory Visit: Payer: Medicare Other | Admitting: Adult Health

## 2017-06-19 ENCOUNTER — Encounter: Payer: Self-pay | Admitting: Adult Health

## 2017-09-03 ENCOUNTER — Ambulatory Visit: Payer: Medicare Other | Admitting: Sports Medicine

## 2017-09-17 ENCOUNTER — Ambulatory Visit (INDEPENDENT_AMBULATORY_CARE_PROVIDER_SITE_OTHER): Payer: Medicare Other | Admitting: Sports Medicine

## 2017-09-17 DIAGNOSIS — Q828 Other specified congenital malformations of skin: Secondary | ICD-10-CM

## 2017-09-17 DIAGNOSIS — B351 Tinea unguium: Secondary | ICD-10-CM

## 2017-09-17 DIAGNOSIS — M79676 Pain in unspecified toe(s): Secondary | ICD-10-CM

## 2017-09-18 ENCOUNTER — Encounter: Payer: Self-pay | Admitting: Sports Medicine

## 2017-09-18 NOTE — Progress Notes (Signed)
Patient ID: Shawna Lin, female   DOB: 1929/04/29, 84 y.o.   MRN: [id number] Subjective: Shawna Lin is a 84 y.o. female patient seen today in office with complaint of painful thickened and elongated toenails; unable to trim. Patient denies any changes since last visit with medical history. Patient has no other pedal complaints at this time.   Patient Active Problem List   Diagnosis Date Noted  . Syncope 12/21/2015  . Dementia 12/21/2015  . No blood products 12/21/2015  . 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 Memory loss 09/02/2015  . HTN (hypertension) 09/02/2015    Current Outpatient Medications on File Prior to Visit  Medication Sig Dispense Refill  . Aspirin 81 MG EC tablet Take 81 mg by mouth daily.    Marland Kitchen donepezil (ARICEPT) 10 MG tablet TAKE ONE TABLET BY MOUTH AT BEDTIME 90 tablet 3  . hydrochlorothiazide (HYDRODIURIL) 12.5 MG tablet Take 12.5 mg by mouth daily.    . memantine (NAMENDA) 5 MG tablet Take 1 tablet (5 mg total) by mouth 2 (two) times daily. 60 tablet 6   No current facility-administered medications on file prior to visit.     No Known Allergies  Objective: Physical Exam  General: Well developed, nourished, no acute distress, awake, alert and oriented x 3  Vascular: Dorsalis pedis artery 1/4 bilateral, Posterior tibial artery 0/4 bilateral, skin temperature warm to warm proximal to distal bilateral lower extremities, trace edema at ankles bilateral, no varicosities, scant pedal hair present bilateral.  Neurological: Gross sensation present via light touch bilateral. Monofilament decreased bilateral.  Dermatological: Skin is warm, dry, and supple bilateral, Nails 1-10 are tender, long, thick, and discolored with mild subungal debris, no webspace macerations present bilateral, no open lesions present bilateral, + minimal callus/hyperkeratotic tissue present plantar left> right hallux. No signs of infection bilateral.  Musculoskeletal: Asymptomatic hammertoe boney  deformities noted bilateral. Muscular strength within normal limits without painon range of motion. No pain with calf compression bilateral.  Assessment and Plan:  Problem List Items Addressed This Visit    None    Visit Diagnoses    Dermatophytosis of nail    -  Primary   Porokeratosis       Pain of toe, unspecified laterality         -Examined patient.  -Discussed treatment options for painful callus and  mycotic nails. -ABN signed  -Mechanically debrided and reduced mycotic nails with sterile nail nipper and dremel nail file without incident. -Recommend continue with good supportive shoes for foot type and also to help prevent worsening of callus as previous and daily skin emollients  -Recommend elevation to assist with edema control as previous  -Patient to return in 3 months for follow up evaluation or sooner if symptoms worsen.  Landis Martins, DPM

## 2017-11-02 ENCOUNTER — Emergency Department (HOSPITAL_COMMUNITY): Payer: Medicare Other

## 2017-11-02 ENCOUNTER — Encounter (HOSPITAL_COMMUNITY): Payer: Self-pay | Admitting: Emergency Medicine

## 2017-11-02 ENCOUNTER — Emergency Department (HOSPITAL_COMMUNITY)
Admission: EM | Admit: 2017-11-02 | Discharge: 2017-11-02 | Disposition: A | Discharge status: Home / Self Care | Payer: Medicare Other | Attending: Physician Assistant | Admitting: Physician Assistant

## 2017-11-02 DIAGNOSIS — R112 Nausea with vomiting, unspecified: Secondary | ICD-10-CM | POA: Diagnosis present

## 2017-11-02 DIAGNOSIS — R55 Syncope and collapse: Secondary | ICD-10-CM | POA: Insufficient documentation

## 2017-11-02 DIAGNOSIS — F0390 Unspecified dementia without behavioral disturbance: Secondary | ICD-10-CM | POA: Insufficient documentation

## 2017-11-02 DIAGNOSIS — R4020 Unspecified coma: Secondary | ICD-10-CM | POA: Diagnosis not present

## 2017-11-02 DIAGNOSIS — R1110 Vomiting, unspecified: Secondary | ICD-10-CM | POA: Diagnosis not present

## 2017-11-02 DIAGNOSIS — R079 Chest pain, unspecified: Secondary | ICD-10-CM | POA: Diagnosis not present

## 2017-11-02 DIAGNOSIS — R110 Nausea: Secondary | ICD-10-CM | POA: Diagnosis not present

## 2017-11-02 LAB — URINALYSIS, ROUTINE W REFLEX MICROSCOPIC
BACTERIA UA: NONE SEEN
BILIRUBIN URINE: NEGATIVE
Glucose, UA: NEGATIVE mg/dL
KETONES UR: 20 mg/dL — AB
Nitrite: NEGATIVE
PROTEIN: 100 mg/dL — AB
Specific Gravity, Urine: 1.015 (ref 1.005–1.030)
pH: 7 (ref 5.0–8.0)

## 2017-11-02 LAB — COMPREHENSIVE METABOLIC PANEL
ALBUMIN: 4 g/dL (ref 3.5–5.0)
ALT: 16 U/L (ref 14–54)
ANION GAP: 10 (ref 5–15)
AST: 36 U/L (ref 15–41)
Alkaline Phosphatase: 96 U/L (ref 38–126)
BUN: 10 mg/dL (ref 6–20)
CHLORIDE: 110 mmol/L (ref 101–111)
CO2: 23 mmol/L (ref 22–32)
CREATININE: 0.91 mg/dL (ref 0.44–1.00)
Calcium: 9.6 mg/dL (ref 8.9–10.3)
GFR calc Af Amer: >60 mL/min (ref >60)
GFR calc non Af Amer: 56 mL/min — ABNORMAL LOW (ref >60)
GLUCOSE: 98 mg/dL (ref 65–99)
Potassium: 4.5 mmol/L (ref 3.5–5.1)
SODIUM: 143 mmol/L (ref 135–145)
Total Bilirubin: 0.8 mg/dL (ref 0.3–1.2)
Total Protein: 6.6 g/dL (ref 6.5–8.1)

## 2017-11-02 LAB — CBC WITH DIFFERENTIAL/PLATELET
Basophils Absolute: 0 10*3/uL (ref 0.0–0.1)
Basophils Relative: 0 %
Eosinophils Absolute: 0 10*3/uL (ref 0.0–0.7)
Eosinophils Relative: 1 %
HEMATOCRIT: 39.8 % (ref 36.0–46.0)
HEMOGLOBIN: 12.4 g/dL (ref 12.0–15.0)
LYMPHS ABS: 1.4 10*3/uL (ref 0.7–4.0)
Lymphocytes Relative: 17 %
MCH: 25.4 pg — AB (ref 26.0–34.0)
MCHC: 31.2 g/dL (ref 30.0–36.0)
MCV: 81.6 fL (ref 78.0–100.0)
MONO ABS: 0.4 10*3/uL (ref 0.1–1.0)
MONOS PCT: 4 %
NEUTROS ABS: 6.6 10*3/uL (ref 1.7–7.7)
NEUTROS PCT: 78 %
Platelets: 226 10*3/uL (ref 150–400)
RBC: 4.88 MIL/uL (ref 3.87–5.11)
RDW: 17.4 % — ABNORMAL HIGH (ref 11.5–15.5)
WBC: 8.4 10*3/uL (ref 4.0–10.5)

## 2017-11-02 LAB — I-STAT TROPONIN, ED: Troponin i, poc: 0 ng/mL (ref 0.00–0.08)

## 2017-11-02 NOTE — ED Triage Notes (Signed)
Per EMS, pt was at [facility] when she had acute onset of N/V after lunch. Pt denies N/V earlier, no flu/cold like sx. Pt denies pain. Pt reports she is still nauseous. EMS VS BP 136/80, P 74, SpO2 97% room air, R 16. CBG 154. Hx Diabetes, HTN and dementia.

## 2017-11-02 NOTE — ED Notes (Addendum)
Patient transported to CT 

## 2017-11-02 NOTE — ED Provider Notes (Addendum)
[facility]PART[facility] Provider Note   CSN: [id number] Arrival date & time: 11/02/17  1553     History   Chief Complaint Chief Complaint  Patient presents with  . Nausea  . Emesis    HPI Shawna Lin is a 84 y.o. female.  HPI   84 year old female with past medical history significant for dementia.  Patient is a Restaurant manager, fast food.  She is here with her Jehovah's Witness sister.  Patient was out to lunch with them at [employer].  All of a sudden patient became quiet her eyes rolled back a little bit her head and then she projectile vomited several times.  She is now back to normal.  Patient is not have a very good memory, is unable to really tell me any preceding events or symptoms.  Patient currently feels "just myself".  No recent change in medication.  Of note patient is unable to tell me what medication she takes or when she takes them.  Patient lives alone.  She is able to tell me that she is in the hospital but is unsure of the date or time or president.  Past Medical History:  Diagnosis Date  . Hypertension   . Memory loss   . Vitamin D deficiency     Patient Active Problem List   Diagnosis Date Noted  . Syncope 12/21/2015  . Dementia 12/21/2015  . No blood products 12/21/2015  . 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 Memory loss 09/02/2015  . HTN (hypertension) 09/02/2015    Past Surgical History:  Procedure Laterality Date  . UTERINE FIBROID SURGERY       OB History   None      Home Medications    Prior to Admission medications   Medication Sig Start Date End Date Taking? Authorizing Provider  Aspirin 81 MG EC tablet Take 81 mg by mouth daily.   Yes [provider]  donepezil (ARICEPT) 10 MG tablet TAKE ONE TABLET BY MOUTH AT BEDTIME 12/18/16  Yes Melvenia Beam, MD  hydrochlorothiazide (HYDRODIURIL) 12.5 MG tablet Take 12.5 mg by mouth daily.   Yes [provider]  memantine (NAMENDA) 5 MG tablet Take 1  tablet (5 mg total) by mouth 2 (two) times daily. 12/18/16  Yes Melvenia Beam, MD    Family History Family History  Problem Relation Age of Onset  . Heart attack Father   . Hypertension Mother   . Colon cancer Brother   . Hypertension Brother   . Diabetes Sister   . Kidney failure Sister   . Hypertension Sister   . Dementia Neg Hx     Social History Social History   Tobacco Use  . Smoking status: Never Smoker  . Smokeless tobacco: Never Used  Substance Use Topics  . Alcohol use: No    Alcohol/week: 0.0 oz  . Drug use: No     Allergies   Patient has no known allergies.   Review of Systems Review of Systems  Constitutional: Negative for activity change.  Respiratory: Negative for shortness of breath.   Cardiovascular: Negative for chest pain.  Gastrointestinal: Positive for vomiting. Negative for abdominal pain.  All other systems reviewed and are negative.    Physical Exam Updated Vital Signs BP (!) 158/84   Pulse 65   Temp 98.7 F (37.1 C) (Oral)   Resp 16   Ht 5' (1.524 m)   Wt 61.2 kg (135 lb)   SpO2 99%   BMI 26.37 kg/m  Physical Exam  Constitutional: She appears well-developed and well-nourished.  HENT:  Head: Normocephalic and atraumatic.  Eyes: Right eye exhibits no discharge. Left eye exhibits no discharge.  Cardiovascular: Normal rate, regular rhythm and normal heart sounds.  No murmur heard. Pulmonary/Chest: Effort normal and breath sounds normal. She has no wheezes. She has no rales.  Abdominal: Soft. She exhibits no distension. There is no tenderness.  Neurological:  Oriented to self.  Unable to give me the correct date, or president.  Skin: Skin is warm and dry. She is not diaphoretic.  Psychiatric: She has a normal mood and affect.  Nursing note and vitals reviewed.    ED Treatments / Results  Labs (all labs ordered are listed, but only abnormal results are displayed) Labs Reviewed  COMPREHENSIVE METABOLIC PANEL - Abnormal;  Notable for the following components:      Result Value   GFR calc non Af Amer 56 (*)    All other components within normal limits  CBC WITH DIFFERENTIAL/PLATELET - Abnormal; Notable for the following components:   MCH 25.4 (*)    RDW 17.4 (*)    All other components within normal limits  URINALYSIS, ROUTINE W REFLEX MICROSCOPIC - Abnormal; Notable for the following components:   APPearance HAZY (*)    Hgb urine dipstick MODERATE (*)    Ketones, ur 20 (*)    Protein, ur 100 (*)    Leukocytes, UA TRACE (*)    Squamous Epithelial / LPF 0-5 (*)    All other components within normal limits  I-STAT TROPONIN, ED    EKG None  Radiology Dg Chest 2 View  Result Date: 11/02/2017 CLINICAL DATA:  Midline chest pain for 1-2 days. EXAM: CHEST - 2 VIEW COMPARISON:  12/21/2015 FINDINGS: Mild cardiomegaly. Tortuous aorta. Lungs are clear. No effusions or acute bony abnormality. IMPRESSION: Mild cardiomegaly.  No active disease. Electronically Signed   By: Rolm Baptise M.D.   On: 11/02/2017 17:39   Ct Head Wo Contrast  Result Date: 11/02/2017 CLINICAL DATA:  84 y/o F; altered level of consciousness, unexplained. EXAM: CT HEAD WITHOUT CONTRAST TECHNIQUE: Contiguous axial images were obtained from the base of the skull through the vertex without intravenous contrast. COMPARISON:  12/22/2015 CT head.  09/13/2016 MRI head. FINDINGS: Brain: No evidence of acute infarction, hemorrhage, hydrocephalus, extra-axial collection or mass lesion/mass effect. Stable chronic lacunar infarcts within left caudate head and lentiform nucleus. Stable chronic microvascular ischemic changes and parenchymal volume loss of the brain. Vascular: Extensive calcific atherosclerosis of carotid siphons. No hyperdense vessel. Skull: Diffuse scattered heterogeneous lucency throughout the bone marrow may be due to demineralization, anemia, or marrow replacement process. No acute fracture. Sinuses/Orbits: No acute finding. Other: None.  IMPRESSION: 1. No acute intracranial abnormality identified. 2. Stable chronic lacunar infarctions within the left basal ganglia, chronic microvascular ischemic changes, and parenchymal volume loss. Heterogeneous lucency throughout the skull may reflect demineralization, anemia, or a marrow replacement process including myeloma or metastasis. Clinical correlation recommended. Electronically Signed   By: Kristine Garbe M.D.   On: 11/02/2017 17:19    Procedures Procedures (including critical care time)  Medications Ordered in ED Medications - No data to display   Initial Impression / Assessment and Plan / ED Course  I have reviewed the triage vital signs and the nursing notes.  Pertinent labs & imaging results that were available during my care of the patient were reviewed by me and considered in my medical decision making (see chart for details).  84 year old female with past medical history significant for dementia.  Patient is a Restaurant manager, fast food.  She is here with her Jehovah's Witness sister.  Patient was out to lunch with them at [employer].  All of a sudden patient became quiet her eyes rolled back a little bit her head and then she projectile vomited several times.  She is now back to normal.  Patient is not have a very good memory, is unable to really tell me any preceding events or symptoms.  Patient currently feels "just myself".  No recent change in medication.  Of note patient is unable to tell me what medication she takes or when she takes them.  Patient lives alone.  She is able to tell me that she is in the hospital but is unsure of the date or time or president.  4:45 PM Will do blood workups were unable to tell exactly what happened.  This is potentially just vomiting after eating, or perhaps patient had a choking episode.  It is possible that it was seizure, or cardiac dysrhythmia.  Discussed starting workup here.  Discussed the benefits and risks of  inpatient admission.  Will discuss again once initial labs are back.  7:28 PM Patient's workup has been relatively reassuring.  Patient has odd findings in the CT brain.  Patient has not seen a physician for "as long as she can remember".  I had a lengthy discussion with patient and patient's friend at bedside.  I really recommended that she get home but offered admission given her age, she does meet many of her required criteria for observation admission for syncope workup.  Especially given patient's lack of preceding symptoms.  However given her age, I suggested that she may be better served at home and follow-up as an outpatient.  However patient feels strongly that she would like to be admitted to hospital.   8:12 PM Discussed with Dr. Princella Pellegrini he went to go exam patient and she changed her mind she would like to go home.  I again think this is a completely reasonable decision.  Patient is DNR/DNI at baseline.  Patient did have mild aortic stenosis on last admission, 2 years ago.  However even if she were to develop worsening her externus as I doubt she is a good surgical candidate.  In addition patient does not really want any kind of large surgeries.  With a again patient centered discussion had with all members present and decision made that she would like to go home.  Patient's [employer] Witness friend family member is going to make sure that she can return to follow-up with her primary care physician to workup for multiple myeloma and get an outpatient echo.    Final Clinical Impressions(s) / ED Diagnoses   Final diagnoses:  None    ED Discharge Orders    None       Macarthur Critchley, MD 11/02/17 1930    Macarthur Critchley, MD 11/02/17 2014

## 2017-11-02 NOTE — ED Notes (Signed)
Pt ambulatory to restroom with assistance. Urine sample provided, urine culture sent to lab with urine sample.

## 2017-11-02 NOTE — ED Notes (Signed)
Patient transported to X-ray 

## 2017-11-02 NOTE — ED Notes (Signed)
Admitting MD and EDP at bedside

## 2017-11-02 NOTE — Discharge Instructions (Addendum)
We are unsure what caused your syncope today.  We offered hospitalization but eventually decision was made that you would like to return home and follow-up as an outpatient.  We want you to follow-up with your primary care physician to get a potential echo as an outpatient as well as to workup the findings on your CAT scan that were significant for potential multiple myeloma.  Please follow-up with your primary care physician as soon as you can.  Please return to the emergency department with any chest pain, recurrent symptoms, or other concerns.

## 2017-11-02 NOTE — ED Notes (Signed)
Pt verbalizes understanding of d/c instructions. Pt taken out to lobby in wheelchair at d/c with all belongings.

## 2017-12-24 ENCOUNTER — Encounter: Payer: Self-pay | Admitting: Sports Medicine

## 2017-12-24 ENCOUNTER — Ambulatory Visit (INDEPENDENT_AMBULATORY_CARE_PROVIDER_SITE_OTHER): Payer: Medicare Other | Admitting: Sports Medicine

## 2017-12-24 DIAGNOSIS — I739 Peripheral vascular disease, unspecified: Secondary | ICD-10-CM

## 2017-12-24 DIAGNOSIS — G629 Polyneuropathy, unspecified: Secondary | ICD-10-CM

## 2017-12-24 DIAGNOSIS — M79676 Pain in unspecified toe(s): Secondary | ICD-10-CM

## 2017-12-24 DIAGNOSIS — B351 Tinea unguium: Secondary | ICD-10-CM

## 2017-12-24 NOTE — Progress Notes (Signed)
Patient ID: Shawna Lin, female   DOB: Mar 12, 1929, 84 y.o.   MRN: [id number] Subjective: Shawna Lin is a 84 y.o. female patient seen today in office with complaint of painful thickened and elongated toenails; unable to trim. Patient denies any changes since last visit with medical history. Patient has no other pedal complaints at this time.   Patient Active Problem List   Diagnosis Date Noted  . Syncope 12/21/2015  . Dementia 12/21/2015  . No blood products 12/21/2015  . 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 Memory loss 09/02/2015  . HTN (hypertension) 09/02/2015    Current Outpatient Medications on File Prior to Visit  Medication Sig Dispense Refill  . amLODipine (NORVASC) 2.5 MG tablet Take 2.5 mg by mouth daily.  6  . Aspirin 81 MG EC tablet Take 81 mg by mouth daily.    Marland Kitchen donepezil (ARICEPT) 10 MG tablet TAKE ONE TABLET BY MOUTH AT BEDTIME 90 tablet 3  . hydrochlorothiazide (HYDRODIURIL) 12.5 MG tablet Take 12.5 mg by mouth daily.    . memantine (NAMENDA) 5 MG tablet Take 1 tablet (5 mg total) by mouth 2 (two) times daily. 60 tablet 6   No current facility-administered medications on file prior to visit.     No Known Allergies  Objective: Physical Exam  General: Well developed, nourished, no acute distress, awake, alert and oriented x 3  Vascular: Dorsalis pedis artery 1/4 bilateral, Posterior tibial artery 0/4 bilateral, skin temperature warm to warm proximal to distal bilateral lower extremities, trace edema at ankles bilateral, no varicosities, scant pedal hair present bilateral.  Neurological: Gross sensation present via light touch bilateral. Monofilament decreased bilateral.  Dermatological: Skin is warm, dry, and supple bilateral, Nails 1-10 are tender, long, thick, and discolored with mild subungal debris, no webspace macerations present bilateral, no open lesions present bilateral, + minimal callus/hyperkeratotic tissue present plantar left> right hallux. No signs of  infection bilateral.  Musculoskeletal: Asymptomatic hammertoe boney deformities noted bilateral. Muscular strength within normal limits without painon range of motion. No pain with calf compression bilateral.  Assessment and Plan:  Problem List Items Addressed This Visit    None    Visit Diagnoses    Dermatophytosis of nail    -  Primary   Pain of toe, unspecified laterality       Neuropathy       PVD (peripheral vascular disease) (HCC)       Relevant Medications   amLODipine (NORVASC) 2.5 MG tablet     -Examined patient.  -Discussed treatment options for painful  mycotic nails. -Mechanically debrided and reduced mycotic nails with sterile nail nipper and dremel nail file without incident. -Recommend continue with good supportive shoes for foot type and also to help prevent worsening of callus as previous and daily skin emollients and pomice stone  -Recommend elevation to assist with edema control as previous  -Patient to return in 3 months for follow up evaluation or sooner if symptoms worsen.  Landis Martins, DPM

## 2018-01-01 ENCOUNTER — Other Ambulatory Visit: Payer: Self-pay | Admitting: Neurology

## 2018-01-27 ENCOUNTER — Other Ambulatory Visit: Payer: Self-pay | Admitting: Neurology

## 2018-02-14 ENCOUNTER — Other Ambulatory Visit: Payer: Self-pay | Admitting: Neurology

## 2018-02-24 DIAGNOSIS — M25551 Pain in right hip: Secondary | ICD-10-CM | POA: Diagnosis not present

## 2018-02-24 DIAGNOSIS — I10 Essential (primary) hypertension: Secondary | ICD-10-CM | POA: Diagnosis not present

## 2018-02-24 DIAGNOSIS — R413 Other amnesia: Secondary | ICD-10-CM | POA: Diagnosis not present

## 2018-03-21 DIAGNOSIS — Z23 Encounter for immunization: Secondary | ICD-10-CM | POA: Diagnosis not present

## 2018-03-28 DIAGNOSIS — I10 Essential (primary) hypertension: Secondary | ICD-10-CM | POA: Diagnosis not present

## 2018-03-28 DIAGNOSIS — M549 Dorsalgia, unspecified: Secondary | ICD-10-CM | POA: Diagnosis not present

## 2018-03-28 DIAGNOSIS — D649 Anemia, unspecified: Secondary | ICD-10-CM | POA: Diagnosis not present

## 2018-03-28 DIAGNOSIS — D509 Iron deficiency anemia, unspecified: Secondary | ICD-10-CM | POA: Diagnosis not present

## 2018-03-28 DIAGNOSIS — R609 Edema, unspecified: Secondary | ICD-10-CM | POA: Diagnosis not present

## 2018-03-28 DIAGNOSIS — M79604 Pain in right leg: Secondary | ICD-10-CM | POA: Diagnosis not present

## 2018-04-01 ENCOUNTER — Other Ambulatory Visit: Payer: Self-pay | Admitting: Neurology

## 2018-04-01 ENCOUNTER — Ambulatory Visit: Payer: Medicare Other | Admitting: Sports Medicine

## 2018-04-29 ENCOUNTER — Ambulatory Visit: Payer: Medicare Other | Admitting: Sports Medicine

## 2018-05-21 DIAGNOSIS — D649 Anemia, unspecified: Secondary | ICD-10-CM | POA: Diagnosis not present

## 2018-05-21 DIAGNOSIS — R413 Other amnesia: Secondary | ICD-10-CM | POA: Diagnosis not present

## 2018-05-21 DIAGNOSIS — R609 Edema, unspecified: Secondary | ICD-10-CM | POA: Diagnosis not present

## 2018-05-21 DIAGNOSIS — I10 Essential (primary) hypertension: Secondary | ICD-10-CM | POA: Diagnosis not present

## 2018-06-03 ENCOUNTER — Ambulatory Visit: Payer: Medicare Other | Admitting: Sports Medicine

## 2018-06-26 ENCOUNTER — Ambulatory Visit
Admission: RE | Admit: 2018-06-26 | Discharge: 2018-06-26 | Disposition: A | Discharge status: Home / Self Care | Payer: Medicare Other | Source: Ambulatory Visit | Attending: Neurology | Admitting: Neurology

## 2018-06-26 ENCOUNTER — Ambulatory Visit (INDEPENDENT_AMBULATORY_CARE_PROVIDER_SITE_OTHER): Payer: Medicare Other | Admitting: Neurology

## 2018-06-26 ENCOUNTER — Encounter: Payer: Self-pay | Admitting: Neurology

## 2018-06-26 VITALS — BP 160/76 | HR 66 | Ht 60.0 in | Wt 143.0 lb

## 2018-06-26 DIAGNOSIS — F0390 Unspecified dementia without behavioral disturbance: Secondary | ICD-10-CM

## 2018-06-26 DIAGNOSIS — M25551 Pain in right hip: Secondary | ICD-10-CM | POA: Diagnosis not present

## 2018-06-26 DIAGNOSIS — M1611 Unilateral primary osteoarthritis, right hip: Secondary | ICD-10-CM | POA: Diagnosis not present

## 2018-06-26 MED ORDER — MEMANTINE HCL 10 MG PO TABS: 10.0000 mg | ORAL_TABLET | Freq: Two times a day (BID) | ORAL | 11 refills | Status: DC

## 2018-06-26 NOTE — Patient Instructions (Signed)
Right Hip Xray [address] Increase Namenda to 10mg  twice daily Follow up one year Memantine Tablets What is this medicine? MEMANTINE (MEM an teen) is used to treat dementia caused by Alzheimer's disease. This medicine may be used for other purposes; ask your health care provider or pharmacist if you have questions. COMMON BRAND NAME(S): Namenda What should I tell my health care provider before I take this medicine? They need to know if you have any of these conditions: -difficulty passing urine -kidney disease -liver disease -seizures -an unusual or allergic reaction to memantine, other medicines, foods, dyes, or preservatives -pregnant or trying to get pregnant -breast-feeding How should I use this medicine? Take this medicine by mouth with a glass of water. Follow the directions on the prescription label. You may take this medicine with or without food. Take your doses at regular intervals. Do not take your medicine more often than directed. Continue to take your medicine even if you feel better. Do not stop taking except on the advice of your doctor or health care professional. Talk to your pediatrician regarding the use of this medicine in children. Special care may be needed. Overdosage: If you think you have taken too much of this medicine contact a poison control center or emergency room at once. NOTE: This medicine is only for you. Do not share this medicine with others. What if I miss a dose? If you miss a dose, take it as soon as you can. If it is almost time for your next dose, take only that dose. Do not take double or extra doses. If you do not take your medicine for several days, contact your health care provider. Your dose may need to be changed. What may interact with this medicine? -acetazolamide -amantadine -cimetidine -dextromethorphan -dofetilide -hydrochlorothiazide -ketamine -metformin -methazolamide -quinidine -ranitidine -sodium  bicarbonate -triamterene This list may not describe all possible interactions. Give your health care provider a list of all the medicines, herbs, non-prescription drugs, or dietary supplements you use. Also tell them if you smoke, drink alcohol, or use illegal drugs. Some items may interact with your medicine. What should I watch for while using this medicine? Visit your doctor or health care professional for regular checks on your progress. Check with your doctor or health care professional if there is no improvement in your symptoms or if they get worse. You may get drowsy or dizzy. Do not drive, use machinery, or do anything that needs mental alertness until you know how this drug affects you. Do not stand or sit up quickly, especially if you are an older patient. This reduces the risk of dizzy or fainting spells. Alcohol can make you more drowsy and dizzy. Avoid alcoholic drinks. What side effects may I notice from receiving this medicine? Side effects that you should report to your doctor or health care professional as soon as possible: -allergic reactions like skin rash, itching or hives, swelling of the face, lips, or tongue -agitation or a feeling of restlessness -depressed mood -dizziness -hallucinations -redness, blistering, peeling or loosening of the skin, including inside the mouth -seizures -vomiting Side effects that usually do not require medical attention (report to your doctor or health care professional if they continue or are bothersome): -constipation -diarrhea -headache -nausea -trouble sleeping This list may not describe all possible side effects. Call your doctor for medical advice about side effects. You may report side effects to FDA at 1-800-FDA-1088. Where should I keep my medicine? Keep out of the reach of children.  Store at room temperature between 15 degrees and 30 degrees C (59 degrees and 86 degrees F). Throw away any unused medicine after the expiration  date. NOTE: This sheet is a summary. It may not cover all possible information. If you have questions about this medicine, talk to your doctor, pharmacist, or health care provider.  2018 [employer]vier/Gold Standard (2013-04-20 14:10:42)

## 2018-06-26 NOTE — Progress Notes (Signed)
[facility]    Provider:  Dr Jaynee Eagles Referring Provider: London Pepper, MD Primary Care Physician:  London Pepper, MD  Primary Care Physician:  Tia Alert, [place]  CC: Dementia likely Alzheimer's  Interval history 06/26/2018: Here with caregiver. She is lovely, she is happy, happy to be alive, enjoying every day. They are very involved in the ministry. They walk a lot, very social, no mood disorder, she feels her memory is doing well, she is reading. They are involved daily with the church. She lives in an assisted living. She sometimes lingers in the past, not getting lost or wandering outside the facility, the facility has a recreation room and has security. [facility]. No falls. Always uses her walking aid. No choking. Her right hip is hurting her. She takes tylenol and heating pad. Its getting worse. Hurts when she is walking a lot. Elevating helps. No swelling, no numbness, no radicular pain.  Friend [phone] Shawna Lin call her   Interval history 12/19/2016: Patient is here for follow-up of dementia. She is here with her "spiritual sister" again. She is on donepezil and discussed starting Namenda today for dementia likely of the Alzheimer's type. She is doing well, she is very social, no depression, no falls, no significant changes. In fact her Mini-Mental Status exam is improving. No new issues. She feels she is doing great, no side effects of the medications. Had a long discussion about Namenda and we will start at low-dose and then increase it in next appointment.  Interval history 03/01/2016: Patient says her niece was stealing from her. Here with her "spiritual sister". Patient says she has not heard from either of her nieces for months. This woman today is helping with her bills. (Will speak to the director of the nursing home to ensure there is nothing unusual goin gon here, she has only know this woman for 6 months). Shawna Lin is a Psychologist, sport and exercise Witness  like patient and they met this way. Memory is doing well. Memory is stable. She is alert, she is going to bible study, doing field work and mentally she is alert. She is I independent living. Patient cooks herslef, sometimes she goes out to eat. There is a dining room but she has her own apartment. Her friend. takes her shopping but she keeps her home clean. No side effects from the aricept. She has never had sex, no children. Discussed MRI of the brain.  MRi brain 09/2015: This MRI of the brain without contrast shows the following: 1. Scattered T2/FLAIR hyperintense foci in the pons in the hemispheres consistent with moderate chronic microvascular ischemic change. 2. Mild generalized cortical atrophy most pronounced in the mesial temporal lobes. 3. There are no acute findings.  KHT:XHFSF Nesbittis a 84 y.o.femalehere as a referral from Dr. Deirdre Peer memory loss. She just moved into the area and is here with her niece Shawna Lin. Memory problems for over a year. Niece provides most information. Patient constantly repeating herself, misplaces things, will think people came intot he house and stole things and then the object will be found. She is really worried about finances. Other niece has power of attorney. She lives in independent living in a senior community. Shawna Lin assists with bills otherwise patient would not be able to handle the finances. Patient is good with medication and appears to take them all correctly. Patient does not drive. She performs most of her own ADLs but needs help with some IADLs such as driving, needs help with bills and help with  shopping. No accidents in the home,no leaving a stove on for example. She doesn't do a lot of cooking, she uses the microwave mostly. Niece is looking into a meals program. No FHx of memory loss or dementia. She is pretty good at her appointments and will call call niece to remind her of the appointment multiple times, unsure if she forgets  she called and then calls again. Patient writes down her appointments, she is organized. No hallucinations. She has significant anxiety and some depression due to change of circumstances. No significant depression and it is getting better, getting out with her friends and shopping and staying social. Memory loss slowly progressive and worsening. She moves money and then forgets and thinks people stole it. Patient is very pleasant. She is a virgin so declines rpr and hiv testing as part of the dementio workup.  Reviewed notes, labs and imaging from outside physicians, which showed: Reviewed records from [facility] physicians. She last saw them on 08/12/2015 and is a new patient. She c/o memory loss. Both nieces at appointment and reported memory loss. They described patient losing things, paranoia such as thinking people are stealing money from her, some blue mood but nothing significant. Labs showed b12 265 and homocysteine and methylmalonic acid are being followed by pcp. tsh and vit D normal. Bmp nml. No other concerns per family.   Review of Systems: Patient complains of symptoms per HPI as well as the following symptoms: memory loss, confusion, depression, racing thoughts. Pertinent negatives per HPI. All others negative.   Social History   Socioeconomic History  . Marital status: Single    Spouse name: Not on file  . Number of children: 0  . Years of education: 76  . Highest education level: Not on file  Occupational History  . Occupation: Retired  Scientific laboratory technician  . Financial resource strain: Not on file  . Food insecurity:    Worry: Not on file    Inability: Not on file  . Transportation needs:    Medical: Not on file    Non-medical: Not on file  Tobacco Use  . Smoking status: Never Smoker  . Smokeless tobacco: Never Used  Substance and Sexual Activity  . Alcohol use: No    Alcohol/week: 0.0 standard drinks  . Drug use: No  . Sexual activity: Not on file  Lifestyle  . Physical  activity:    Days per week: Not on file    Minutes per session: Not on file  . Stress: Not on file  Relationships  . Social connections:    Talks on phone: Not on file    Gets together: Not on file    Attends religious service: Not on file    Active member of club or organization: Not on file    Attends meetings of clubs or organizations: Not on file    Relationship status: Not on file  . Intimate partner violence:    Fear of current or ex partner: Not on file    Emotionally abused: Not on file    Physically abused: Not on file    Forced sexual activity: Not on file  Other Topics Concern  . Not on file  Social History Narrative   Lives alone, has a caregiver that lives closeby   Caffeine use: 2 cups coffee per day    Family History  Problem Relation Age of Onset  . Heart attack Father   . Hypertension Mother   . Colon cancer Brother   .  Hypertension Brother   . Diabetes Sister   . Kidney failure Sister   . Hypertension Sister   . Dementia Neg Hx     Past Medical History:  Diagnosis Date  . Hypertension   . Memory loss   . Vitamin D deficiency     Past Surgical History:  Procedure Laterality Date  . UTERINE FIBROID SURGERY      Current Outpatient Medications  Medication Sig Dispense Refill  . amLODipine (NORVASC) 2.5 MG tablet Take 2.5 mg by mouth daily.  6  . Aspirin 81 MG EC tablet Take 81 mg by mouth daily.    Marland Kitchen donepezil (ARICEPT) 10 MG tablet TAKE ONE TABLET BY MOUTH AT BEDTIME 90 tablet 3  . hydrochlorothiazide (HYDRODIURIL) 12.5 MG tablet Take 12.5 mg by mouth daily.    . memantine (NAMENDA) 10 MG tablet Take 1 tablet (10 mg total) by mouth 2 (two) times daily. 60 tablet 11   No current facility-administered medications for this visit.     Allergies as of 06/26/2018  . (No Known Allergies)    Vitals: BP (!) 160/76 (BP Location: Right Arm, Patient Position: Sitting)   Pulse 66   Ht 5' (1.524 m)   Wt 143 lb (64.9 kg)   BMI 27.93 kg/m  Last  Weight:  Wt Readings from Last 1 Encounters:  06/26/18 143 lb (64.9 kg)   Last Height:   Ht Readings from Last 1 Encounters:  06/26/18 5' (1.524 m)   MMSE - Mini Mental State Exam 06/26/2018 12/18/2016 03/01/2016  Orientation to time 0 3 4  Orientation to Place 2 3 2   Registration 3 3 3   Attention/ Calculation 0 3 2  Recall 0 1 0  Language- name 2 objects 2 2 2   Language- repeat 1 1 1   Language- follow 3 step command 3 3 3   Language- read & follow direction 1 1 1   Write a sentence 1 1 1   Copy design 0 0 0  Total score 13 21 19    Physical exam: Exam: Gen: NAD, conversant                  Eyes: Conjunctivae clear without exudates or hemorrhage  Neuro: Detailed Neurologic Exam  Speech:    Speech is normal; fluent and spontaneous with normal comprehension.  Cranial Nerves:    The pupils are equal, round, and reactive to light.  Visual fields are full to finger confrontation. Extraocular movements are intact. Trigeminal sensation is intact and the muscles of mastication are normal. The face is symmetric. The palate elevates in the midline. Hearing intact. Voice is normal. Shoulder shrug is normal. The tongue has normal motion without fasciculations.   Motor Observation:    No asymmetry, no atrophy, and no involuntary movements noted.    Strength:    Strength is V/V in the upper and lower limbs.           Assessment/Plan:Lovely 84 year old with memory loss. MMSE 19-21/30 in the past today 13/30, appears to be moderate to severity and likely of the alzheimer's type of dementia. TSH, B12 followed by pcp. Continue  Aricept. She is doing on Aricept we'll started Namenda at last appointment.  Discussed dementia is a neurodegenerative process what will continue to worsen.  F/u 6 months.  Patient is here with her "spiritual sister" Shawna Lin is a Foss like patient and they met this way. This women is helping with finances. I called the director of The  [facility] and left message for Elmyra Ricks or Joey at the nursing home to inform them and ensure nothing unusual is going on. [phone]. I spoke to the director and she says she is not aware of any abuse or elder abuse or financial misconduct.  Increase Namenda 10 mg twice daily. Discussed side effects. Discuss Alzheimer's dementia with patient again and her spiritual sister. This is a neurocognitive disorder and a progressive neurodegenerative process. However patient appears to be doing well and is very happy and is very engaged.    Cc: Alvan Dame, MD  [facility] [address] [place], [place] [address]  Phone [phone] Fax [phone]  A total of 25 minutes was spent face-to-face with this patient. Over half this time was spent on counseling patient on the  1. Dementia without behavioral disturbance, unspecified dementia type ([place])   2. Right hip pain    diagnosis and different diagnostic and therapeutic options available.

## 2018-07-01 ENCOUNTER — Telehealth: Payer: Self-pay | Admitting: *Deleted

## 2018-07-01 NOTE — Telephone Encounter (Signed)
Spoke with patient's caregiver Rona Ravens (on [place]) and discussed that pt's xray results indicate arthritis in the right hip that is likely causing the pain. Dr. Jaynee Eagles can refer pt to orthopedics for evaluation of treatment such as mediations, injections, or hip replacement. Alden Benjamin verbalized understanding and took the message to discuss with the patient. Then she will call us back tomorrow with their decision regarding the referral. She verbalized appreciation.

## 2018-07-01 NOTE — Telephone Encounter (Signed)
-----   Message from Melvenia Beam, MD sent at 06/29/2018  7:33 PM EST ----- There is arthritis in the left hip which is likely causing the pain. We can refer to orthopaedics for evaluation of treatment such as medications, injections or hip replacement. Let me know thanks

## 2018-08-12 ENCOUNTER — Emergency Department (HOSPITAL_COMMUNITY)
Admission: EM | Admit: 2018-08-12 | Discharge: 2018-08-12 | Disposition: A | Discharge status: Home / Self Care | Payer: Medicare Other | Attending: Emergency Medicine | Admitting: Emergency Medicine

## 2018-08-12 DIAGNOSIS — R4182 Altered mental status, unspecified: Secondary | ICD-10-CM | POA: Diagnosis not present

## 2018-08-12 DIAGNOSIS — I10 Essential (primary) hypertension: Secondary | ICD-10-CM | POA: Insufficient documentation

## 2018-08-12 DIAGNOSIS — F0390 Unspecified dementia without behavioral disturbance: Secondary | ICD-10-CM | POA: Insufficient documentation

## 2018-08-12 LAB — CBC WITH DIFFERENTIAL/PLATELET
ABS IMMATURE GRANULOCYTES: 0.03 10*3/uL (ref 0.00–0.07)
Basophils Absolute: 0 10*3/uL (ref 0.0–0.1)
Basophils Relative: 1 %
Eosinophils Absolute: 0 10*3/uL (ref 0.0–0.5)
Eosinophils Relative: 1 %
HCT: 35.9 % — ABNORMAL LOW (ref 36.0–46.0)
Hemoglobin: 10.7 g/dL — ABNORMAL LOW (ref 12.0–15.0)
IMMATURE GRANULOCYTES: 1 %
Lymphocytes Relative: 22 %
Lymphs Abs: 1.5 10*3/uL (ref 0.7–4.0)
MCH: 24.9 pg — ABNORMAL LOW (ref 26.0–34.0)
MCHC: 29.8 g/dL — ABNORMAL LOW (ref 30.0–36.0)
MCV: 83.7 fL (ref 80.0–100.0)
Monocytes Absolute: 0.6 10*3/uL (ref 0.1–1.0)
Monocytes Relative: 9 %
NEUTROS ABS: 4.4 10*3/uL (ref 1.7–7.7)
Neutrophils Relative %: 66 %
PLATELETS: 235 10*3/uL (ref 150–400)
RBC: 4.29 MIL/uL (ref 3.87–5.11)
RDW: 18 % — ABNORMAL HIGH (ref 11.5–15.5)
WBC: 6.6 10*3/uL (ref 4.0–10.5)
nRBC: 0.6 % — ABNORMAL HIGH (ref 0.0–0.2)

## 2018-08-12 LAB — COMPREHENSIVE METABOLIC PANEL
ALT: 14 U/L (ref 0–44)
AST: 26 U/L (ref 15–41)
Albumin: 4.4 g/dL (ref 3.5–5.0)
Alkaline Phosphatase: 88 U/L (ref 38–126)
Anion gap: 7 (ref 5–15)
BILIRUBIN TOTAL: 0.6 mg/dL (ref 0.3–1.2)
BUN: 19 mg/dL (ref 8–23)
CHLORIDE: 107 mmol/L (ref 98–111)
CO2: 28 mmol/L (ref 22–32)
Calcium: 9.6 mg/dL (ref 8.9–10.3)
Creatinine, Ser: 1.01 mg/dL — ABNORMAL HIGH (ref 0.44–1.00)
GFR calc Af Amer: 58 mL/min — ABNORMAL LOW (ref >60)
GFR, EST NON AFRICAN AMERICAN: 50 mL/min — AB (ref >60)
Glucose, Bld: 107 mg/dL — ABNORMAL HIGH (ref 70–99)
Potassium: 3.9 mmol/L (ref 3.5–5.1)
Sodium: 142 mmol/L (ref 135–145)
Total Protein: 6.7 g/dL (ref 6.5–8.1)

## 2018-08-12 LAB — URINALYSIS, ROUTINE W REFLEX MICROSCOPIC
Bacteria, UA: NONE SEEN
Bilirubin Urine: NEGATIVE
Glucose, UA: NEGATIVE mg/dL
Hgb urine dipstick: NEGATIVE
Ketones, ur: NEGATIVE mg/dL
Nitrite: NEGATIVE
PROTEIN: 100 mg/dL — AB
Specific Gravity, Urine: 1.019 (ref 1.005–1.030)
pH: 7 (ref 5.0–8.0)

## 2018-08-12 LAB — CBG MONITORING, ED: Glucose-Capillary: 116 mg/dL — ABNORMAL HIGH (ref 70–99)

## 2018-08-12 LAB — LIPASE, BLOOD: Lipase: 53 U/L — ABNORMAL HIGH (ref 11–51)

## 2018-08-12 MED ORDER — SODIUM CHLORIDE 0.9 % IV BOLUS
500.0000 mL | Freq: Once | INTRAVENOUS | Status: AC
  Administered 2018-08-12: 500 mL via INTRAVENOUS

## 2018-08-12 NOTE — ED Notes (Signed)
Bed: WA02 Expected date:  Expected time:  Means of arrival:  Comments: EMS 80s AMS

## 2018-08-12 NOTE — Discharge Planning (Signed)
Kayleanna Lorman J. Clydene Laming, RN, BSN, [place] [phone] EDSW Spoke with pt caregiver at bedside regarding discharge planning for [facility]. Offered pt list of home health agencies to choose from.  Caregiver/Facility chose [facility] to render services. Dian Situ of [facility] notified. Caregiver/Facility made aware that [facility] will be in contact in 24-48 hours.  No DME needs identified at this time.

## 2018-08-12 NOTE — Discharge Instructions (Signed)
You were seen in the ED today with mild confusion. This is likely worsening of your underlying dementia. Your lab work and urine tests are negative. Call your primary doctor today to schedule an outpatient appointment. I have ordered home health to come by and see you at your home. You may require orders to move you to a facility with some more assistance. The home health social worker can help with this. Return to the ED with any new or worsening symptoms.

## 2018-08-12 NOTE — ED Triage Notes (Signed)
Transported by [facility] from facility-- AAO x 1 but per family patient is usually AAO x 4. No physical complaints reported. VSS with EMS.

## 2018-08-12 NOTE — ED Provider Notes (Signed)
Emergency Department Provider Note   I have reviewed the triage vital signs and the nursing notes.   HISTORY  Chief Complaint Altered Mental Status   HPI Shawna Lin is a 85 y.o. female with PMH of memory loss and HTN presents to the emergency department with concern for altered mental status.  Patient lives in an independent living facility.  Her caregiver is at bedside who calls to check on her multiple times per day and takes her for doctor's appointments.  She states that she was called by the facility staff saying that the patient seemed more confused today.  She was apparently packing up boxes in her room and putting some of them in the hallway.   The patient states that she is feeling well with no pain.  She denies any weakness or numbness.  No vomiting or diarrhea.  No shortness of breath.  No new medications added recently.   Level 5 caveat: Mild dementia.   Past Medical History:  Diagnosis Date  . Hypertension   . Memory loss   . Vitamin D deficiency     Patient Active Problem List   Diagnosis Date Noted  . Syncope 12/21/2015  . Dementia ([place]) 12/21/2015  . No blood products 12/21/2015  . 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 Memory loss 09/02/2015  . HTN (hypertension) 09/02/2015    Past Surgical History:  Procedure Laterality Date  . UTERINE FIBROID SURGERY     Allergies Patient has no known allergies.  Family History  Problem Relation Age of Onset  . Heart attack Father   . Hypertension Mother   . Colon cancer Brother   . Hypertension Brother   . Diabetes Sister   . Kidney failure Sister   . Hypertension Sister   . Dementia Neg Hx     Social History Social History   Tobacco Use  . Smoking status: Never Smoker  . Smokeless tobacco: Never Used  Substance Use Topics  . Alcohol use: No    Alcohol/week: 0.0 standard drinks  . Drug use: No    Review of Systems  Constitutional: No fever/chills Eyes: No visual changes. ENT: No sore  throat. Cardiovascular: Denies chest pain. Respiratory: Denies shortness of breath. Gastrointestinal: No abdominal pain.  No nausea, no vomiting.  No diarrhea.  No constipation. Genitourinary: Negative for dysuria. Musculoskeletal: Negative for back pain. Skin: Negative for rash. Neurological: Negative for headaches, focal weakness or numbness.  10-point ROS otherwise negative.  ____________________________________________   PHYSICAL EXAM:  VITAL SIGNS: Vitals:   08/12/18 1430 08/12/18 1500  BP: 131/66 (!) 111/56  Pulse: 72 70  Resp: 17 16  Temp:    SpO2: 97% 98%    Constitutional: Alert and oriented to person, place, and month. Well appearing and in no acute distress. Eyes: Conjunctivae are normal. PERRL.  Head: Atraumatic. Nose: No congestion/rhinnorhea. Mouth/Throat: Mucous membranes are moist.  Neck: No stridor.  Cardiovascular: Normal rate, regular rhythm. Good peripheral circulation. Grossly normal heart sounds.   Respiratory: Normal respiratory effort.  No retractions. Lungs CTAB. Gastrointestinal: Soft and nontender. No distention.  Musculoskeletal: No lower extremity tenderness nor edema. No gross deformities of extremities. Neurologic:  Normal speech and language. No gross focal neurologic deficits are appreciated.  Skin:  Skin is warm, dry and intact. No rash noted.  ____________________________________________   LABS (all labs ordered are listed, but only abnormal results are displayed)  Labs Reviewed  COMPREHENSIVE METABOLIC PANEL - Abnormal; Notable for the following components:  Result Value   Glucose, Bld 107 (*)    Creatinine, Ser 1.01 (*)    GFR calc non Af Amer 50 (*)    GFR calc Af Amer 58 (*)    All other components within normal limits  LIPASE, BLOOD - Abnormal; Notable for the following components:   Lipase 53 (*)    All other components within normal limits  CBC WITH DIFFERENTIAL/PLATELET - Abnormal; Notable for the following  components:   Hemoglobin 10.7 (*)    HCT 35.9 (*)    MCH 24.9 (*)    MCHC 29.8 (*)    RDW 18.0 (*)    nRBC 0.6 (*)    All other components within normal limits  URINALYSIS, ROUTINE W REFLEX MICROSCOPIC - Abnormal; Notable for the following components:   APPearance HAZY (*)    Protein, ur 100 (*)    Leukocytes, UA MODERATE (*)    All other components within normal limits  CBG MONITORING, ED - Abnormal; Notable for the following components:   Glucose-Capillary 116 (*)    All other components within normal limits  URINE CULTURE   ____________________________________________  EKG   EKG Interpretation  Date/Time:  Tuesday August 12 2018 11:40:16 EST Ventricular Rate:  92 PR Interval:    QRS Duration: 81 QT Interval:  404 QTC Calculation: 500 R Axis:   79 Text Interpretation:  Sinus rhythm Borderline T abnormalities, inferior leads Borderline prolonged QT interval No STEMI.  Confirmed by Nanda Quinton [phone]) on 08/12/2018 12:01:54 PM       ____________________________________________  RADIOLOGY  None ____________________________________________   PROCEDURES  Procedure(s) performed:   Procedures  None ____________________________________________   INITIAL IMPRESSION / ASSESSMENT AND PLAN / ED COURSE  Pertinent labs & imaging results that were available during my care of the patient were reviewed by me and considered in my medical decision making (see chart for details).  Patient presents to the emergency department with altered mental status.  Patient is awake, talkative, alert.  She is able to tell me the month.  She initially tells me it is 1920 but then corrects to 2020.  She tells me her name but cannot recall the name of the president.  She was seen in December by neurology for memory issues and the suspicion is that she is developing some dementia symptoms.  Patient does not appear to have delirium.  No focal neurological deficits.  No evidence of infection.   Patient is only on HCTZ for hypertension which is a stable dose of medication.  Plan for screening lab work, IV fluids, urine analysis.  Do not feel that she would benefit from neuroimaging at that time but will continue to reassess.  No UTI. Will send for culture. No UTI symptoms. Labs reviewed. No imaging at this time. Dicussed with caregiver at bedside. I ordered home health and case manager has seen the patient. Discussed that patient may need ALF and ultimately SNF/memory care. Caregiver comfortable with plan at discharge.  ____________________________________________  FINAL CLINICAL IMPRESSION(S) / ED DIAGNOSES  Final diagnoses:  Altered mental status, unspecified altered mental status type    MEDICATIONS GIVEN DURING THIS VISIT:  Medications  sodium chloride 0.9 % bolus 500 mL (0 mLs Intravenous Stopped 08/12/18 1438)    Note:  This document was prepared using Dragon voice recognition software and may include unintentional dictation errors.  Nanda Quinton, MD Emergency Medicine    Graciemae Delisle, [facility], MD 08/12/18 [phone]

## 2018-08-13 LAB — URINE CULTURE
Culture: 40000 — AB
Special Requests: NORMAL

## 2018-08-14 ENCOUNTER — Telehealth: Payer: Self-pay | Admitting: *Deleted

## 2018-08-14 NOTE — Telephone Encounter (Signed)
Post ED Visit - Positive Culture Follow-up  Culture report reviewed by antimicrobial stewardship pharmacist:  []  Shawna Lin, Pharm.D. []  Shawna Lin, Pharm.D., BCPS AQ-ID []  Shawna Lin, Pharm.D., BCPS []  Shawna Lin, Pharm.D., BCPS []  Shawna Lin, Pharm.D., BCPS, AAHIVP []  Shawna Lin, Pharm.D., BCPS, AAHIVP [x]  Shawna Lin, PharmD, BCPS []  Shawna Lin, PharmD, BCPS []  Shawna Lin, PharmD, BCPS []  Shawna Lin, PharmD  Positive urine culture, reviewed by Shawna Cornfield, PA-C No further patient follow-up is required at this time.  Shawna Lin 08/14/2018, 10:20 AM

## 2018-08-15 DIAGNOSIS — R269 Unspecified abnormalities of gait and mobility: Secondary | ICD-10-CM | POA: Diagnosis not present

## 2018-08-15 DIAGNOSIS — F0390 Unspecified dementia without behavioral disturbance: Secondary | ICD-10-CM | POA: Diagnosis not present

## 2018-08-15 DIAGNOSIS — I10 Essential (primary) hypertension: Secondary | ICD-10-CM | POA: Diagnosis not present

## 2018-08-15 DIAGNOSIS — Z7982 Long term (current) use of aspirin: Secondary | ICD-10-CM | POA: Diagnosis not present

## 2018-08-15 DIAGNOSIS — E559 Vitamin D deficiency, unspecified: Secondary | ICD-10-CM | POA: Diagnosis not present

## 2018-08-19 DIAGNOSIS — I10 Essential (primary) hypertension: Secondary | ICD-10-CM | POA: Diagnosis not present

## 2018-08-19 DIAGNOSIS — Z7982 Long term (current) use of aspirin: Secondary | ICD-10-CM | POA: Diagnosis not present

## 2018-08-19 DIAGNOSIS — E559 Vitamin D deficiency, unspecified: Secondary | ICD-10-CM | POA: Diagnosis not present

## 2018-08-19 DIAGNOSIS — F0390 Unspecified dementia without behavioral disturbance: Secondary | ICD-10-CM | POA: Diagnosis not present

## 2018-08-19 DIAGNOSIS — R269 Unspecified abnormalities of gait and mobility: Secondary | ICD-10-CM | POA: Diagnosis not present

## 2018-08-20 DIAGNOSIS — F0390 Unspecified dementia without behavioral disturbance: Secondary | ICD-10-CM | POA: Diagnosis not present

## 2018-08-20 DIAGNOSIS — Z7982 Long term (current) use of aspirin: Secondary | ICD-10-CM | POA: Diagnosis not present

## 2018-08-20 DIAGNOSIS — R269 Unspecified abnormalities of gait and mobility: Secondary | ICD-10-CM | POA: Diagnosis not present

## 2018-08-20 DIAGNOSIS — I10 Essential (primary) hypertension: Secondary | ICD-10-CM | POA: Diagnosis not present

## 2018-08-20 DIAGNOSIS — E559 Vitamin D deficiency, unspecified: Secondary | ICD-10-CM | POA: Diagnosis not present

## 2018-08-21 DIAGNOSIS — Z7982 Long term (current) use of aspirin: Secondary | ICD-10-CM | POA: Diagnosis not present

## 2018-08-21 DIAGNOSIS — R269 Unspecified abnormalities of gait and mobility: Secondary | ICD-10-CM | POA: Diagnosis not present

## 2018-08-21 DIAGNOSIS — I10 Essential (primary) hypertension: Secondary | ICD-10-CM | POA: Diagnosis not present

## 2018-08-21 DIAGNOSIS — F0390 Unspecified dementia without behavioral disturbance: Secondary | ICD-10-CM | POA: Diagnosis not present

## 2018-08-21 DIAGNOSIS — E559 Vitamin D deficiency, unspecified: Secondary | ICD-10-CM | POA: Diagnosis not present

## 2018-08-22 DIAGNOSIS — R269 Unspecified abnormalities of gait and mobility: Secondary | ICD-10-CM | POA: Diagnosis not present

## 2018-08-22 DIAGNOSIS — F0390 Unspecified dementia without behavioral disturbance: Secondary | ICD-10-CM | POA: Diagnosis not present

## 2018-08-22 DIAGNOSIS — I10 Essential (primary) hypertension: Secondary | ICD-10-CM | POA: Diagnosis not present

## 2018-08-22 DIAGNOSIS — Z7982 Long term (current) use of aspirin: Secondary | ICD-10-CM | POA: Diagnosis not present

## 2018-08-22 DIAGNOSIS — E559 Vitamin D deficiency, unspecified: Secondary | ICD-10-CM | POA: Diagnosis not present

## 2018-08-25 DIAGNOSIS — E559 Vitamin D deficiency, unspecified: Secondary | ICD-10-CM | POA: Diagnosis not present

## 2018-08-25 DIAGNOSIS — I10 Essential (primary) hypertension: Secondary | ICD-10-CM | POA: Diagnosis not present

## 2018-08-25 DIAGNOSIS — Z7982 Long term (current) use of aspirin: Secondary | ICD-10-CM | POA: Diagnosis not present

## 2018-08-25 DIAGNOSIS — R269 Unspecified abnormalities of gait and mobility: Secondary | ICD-10-CM | POA: Diagnosis not present

## 2018-08-25 DIAGNOSIS — F0390 Unspecified dementia without behavioral disturbance: Secondary | ICD-10-CM | POA: Diagnosis not present

## 2018-08-27 DIAGNOSIS — E559 Vitamin D deficiency, unspecified: Secondary | ICD-10-CM | POA: Diagnosis not present

## 2018-08-27 DIAGNOSIS — F0390 Unspecified dementia without behavioral disturbance: Secondary | ICD-10-CM | POA: Diagnosis not present

## 2018-08-27 DIAGNOSIS — Z7982 Long term (current) use of aspirin: Secondary | ICD-10-CM | POA: Diagnosis not present

## 2018-08-27 DIAGNOSIS — I10 Essential (primary) hypertension: Secondary | ICD-10-CM | POA: Diagnosis not present

## 2018-08-27 DIAGNOSIS — R269 Unspecified abnormalities of gait and mobility: Secondary | ICD-10-CM | POA: Diagnosis not present

## 2018-08-28 DIAGNOSIS — F0390 Unspecified dementia without behavioral disturbance: Secondary | ICD-10-CM | POA: Diagnosis not present

## 2018-08-28 DIAGNOSIS — I10 Essential (primary) hypertension: Secondary | ICD-10-CM | POA: Diagnosis not present

## 2018-08-28 DIAGNOSIS — Z7982 Long term (current) use of aspirin: Secondary | ICD-10-CM | POA: Diagnosis not present

## 2018-08-28 DIAGNOSIS — R269 Unspecified abnormalities of gait and mobility: Secondary | ICD-10-CM | POA: Diagnosis not present

## 2018-08-28 DIAGNOSIS — E559 Vitamin D deficiency, unspecified: Secondary | ICD-10-CM | POA: Diagnosis not present

## 2018-08-29 DIAGNOSIS — E559 Vitamin D deficiency, unspecified: Secondary | ICD-10-CM | POA: Diagnosis not present

## 2018-08-29 DIAGNOSIS — Z7982 Long term (current) use of aspirin: Secondary | ICD-10-CM | POA: Diagnosis not present

## 2018-08-29 DIAGNOSIS — F0390 Unspecified dementia without behavioral disturbance: Secondary | ICD-10-CM | POA: Diagnosis not present

## 2018-08-29 DIAGNOSIS — R269 Unspecified abnormalities of gait and mobility: Secondary | ICD-10-CM | POA: Diagnosis not present

## 2018-08-29 DIAGNOSIS — I10 Essential (primary) hypertension: Secondary | ICD-10-CM | POA: Diagnosis not present

## 2018-09-01 DIAGNOSIS — R269 Unspecified abnormalities of gait and mobility: Secondary | ICD-10-CM | POA: Diagnosis not present

## 2018-09-01 DIAGNOSIS — F0390 Unspecified dementia without behavioral disturbance: Secondary | ICD-10-CM | POA: Diagnosis not present

## 2018-09-01 DIAGNOSIS — Z7982 Long term (current) use of aspirin: Secondary | ICD-10-CM | POA: Diagnosis not present

## 2018-09-01 DIAGNOSIS — E559 Vitamin D deficiency, unspecified: Secondary | ICD-10-CM | POA: Diagnosis not present

## 2018-09-01 DIAGNOSIS — I10 Essential (primary) hypertension: Secondary | ICD-10-CM | POA: Diagnosis not present

## 2018-09-02 DIAGNOSIS — R269 Unspecified abnormalities of gait and mobility: Secondary | ICD-10-CM | POA: Diagnosis not present

## 2018-09-02 DIAGNOSIS — E559 Vitamin D deficiency, unspecified: Secondary | ICD-10-CM | POA: Diagnosis not present

## 2018-09-02 DIAGNOSIS — I10 Essential (primary) hypertension: Secondary | ICD-10-CM | POA: Diagnosis not present

## 2018-09-02 DIAGNOSIS — F0390 Unspecified dementia without behavioral disturbance: Secondary | ICD-10-CM | POA: Diagnosis not present

## 2018-09-02 DIAGNOSIS — Z7982 Long term (current) use of aspirin: Secondary | ICD-10-CM | POA: Diagnosis not present

## 2018-09-03 DIAGNOSIS — R269 Unspecified abnormalities of gait and mobility: Secondary | ICD-10-CM | POA: Diagnosis not present

## 2018-09-03 DIAGNOSIS — F0390 Unspecified dementia without behavioral disturbance: Secondary | ICD-10-CM | POA: Diagnosis not present

## 2018-09-03 DIAGNOSIS — I10 Essential (primary) hypertension: Secondary | ICD-10-CM | POA: Diagnosis not present

## 2018-09-03 DIAGNOSIS — Z7982 Long term (current) use of aspirin: Secondary | ICD-10-CM | POA: Diagnosis not present

## 2018-09-03 DIAGNOSIS — E559 Vitamin D deficiency, unspecified: Secondary | ICD-10-CM | POA: Diagnosis not present

## 2018-09-04 DIAGNOSIS — E559 Vitamin D deficiency, unspecified: Secondary | ICD-10-CM | POA: Diagnosis not present

## 2018-09-04 DIAGNOSIS — R269 Unspecified abnormalities of gait and mobility: Secondary | ICD-10-CM | POA: Diagnosis not present

## 2018-09-04 DIAGNOSIS — Z7982 Long term (current) use of aspirin: Secondary | ICD-10-CM | POA: Diagnosis not present

## 2018-09-04 DIAGNOSIS — F0390 Unspecified dementia without behavioral disturbance: Secondary | ICD-10-CM | POA: Diagnosis not present

## 2018-09-04 DIAGNOSIS — I10 Essential (primary) hypertension: Secondary | ICD-10-CM | POA: Diagnosis not present

## 2018-09-08 DIAGNOSIS — R269 Unspecified abnormalities of gait and mobility: Secondary | ICD-10-CM | POA: Diagnosis not present

## 2018-09-08 DIAGNOSIS — E559 Vitamin D deficiency, unspecified: Secondary | ICD-10-CM | POA: Diagnosis not present

## 2018-09-08 DIAGNOSIS — Z7982 Long term (current) use of aspirin: Secondary | ICD-10-CM | POA: Diagnosis not present

## 2018-09-08 DIAGNOSIS — F0390 Unspecified dementia without behavioral disturbance: Secondary | ICD-10-CM | POA: Diagnosis not present

## 2018-09-08 DIAGNOSIS — I10 Essential (primary) hypertension: Secondary | ICD-10-CM | POA: Diagnosis not present

## 2018-09-09 DIAGNOSIS — E559 Vitamin D deficiency, unspecified: Secondary | ICD-10-CM | POA: Diagnosis not present

## 2018-09-09 DIAGNOSIS — F0390 Unspecified dementia without behavioral disturbance: Secondary | ICD-10-CM | POA: Diagnosis not present

## 2018-09-09 DIAGNOSIS — Z7982 Long term (current) use of aspirin: Secondary | ICD-10-CM | POA: Diagnosis not present

## 2018-09-09 DIAGNOSIS — R269 Unspecified abnormalities of gait and mobility: Secondary | ICD-10-CM | POA: Diagnosis not present

## 2018-09-09 DIAGNOSIS — I10 Essential (primary) hypertension: Secondary | ICD-10-CM | POA: Diagnosis not present

## 2018-09-10 DIAGNOSIS — I10 Essential (primary) hypertension: Secondary | ICD-10-CM | POA: Diagnosis not present

## 2018-09-10 DIAGNOSIS — D649 Anemia, unspecified: Secondary | ICD-10-CM | POA: Diagnosis not present

## 2018-09-10 DIAGNOSIS — Z111 Encounter for screening for respiratory tuberculosis: Secondary | ICD-10-CM | POA: Diagnosis not present

## 2018-09-10 DIAGNOSIS — R4182 Altered mental status, unspecified: Secondary | ICD-10-CM | POA: Diagnosis not present

## 2018-09-10 DIAGNOSIS — D509 Iron deficiency anemia, unspecified: Secondary | ICD-10-CM | POA: Diagnosis not present

## 2018-09-10 DIAGNOSIS — F0390 Unspecified dementia without behavioral disturbance: Secondary | ICD-10-CM | POA: Diagnosis not present

## 2018-09-10 DIAGNOSIS — E559 Vitamin D deficiency, unspecified: Secondary | ICD-10-CM | POA: Diagnosis not present

## 2018-09-11 DIAGNOSIS — I10 Essential (primary) hypertension: Secondary | ICD-10-CM | POA: Diagnosis not present

## 2018-09-11 DIAGNOSIS — F0390 Unspecified dementia without behavioral disturbance: Secondary | ICD-10-CM | POA: Diagnosis not present

## 2018-09-11 DIAGNOSIS — Z7982 Long term (current) use of aspirin: Secondary | ICD-10-CM | POA: Diagnosis not present

## 2018-09-11 DIAGNOSIS — E559 Vitamin D deficiency, unspecified: Secondary | ICD-10-CM | POA: Diagnosis not present

## 2018-09-11 DIAGNOSIS — R269 Unspecified abnormalities of gait and mobility: Secondary | ICD-10-CM | POA: Diagnosis not present

## 2018-09-14 DIAGNOSIS — R269 Unspecified abnormalities of gait and mobility: Secondary | ICD-10-CM | POA: Diagnosis not present

## 2018-09-14 DIAGNOSIS — E559 Vitamin D deficiency, unspecified: Secondary | ICD-10-CM | POA: Diagnosis not present

## 2018-09-14 DIAGNOSIS — Z7982 Long term (current) use of aspirin: Secondary | ICD-10-CM | POA: Diagnosis not present

## 2018-09-14 DIAGNOSIS — F0390 Unspecified dementia without behavioral disturbance: Secondary | ICD-10-CM | POA: Diagnosis not present

## 2018-09-14 DIAGNOSIS — I10 Essential (primary) hypertension: Secondary | ICD-10-CM | POA: Diagnosis not present

## 2018-09-15 DIAGNOSIS — F0390 Unspecified dementia without behavioral disturbance: Secondary | ICD-10-CM | POA: Diagnosis not present

## 2018-09-15 DIAGNOSIS — I10 Essential (primary) hypertension: Secondary | ICD-10-CM | POA: Diagnosis not present

## 2018-09-15 DIAGNOSIS — Z7982 Long term (current) use of aspirin: Secondary | ICD-10-CM | POA: Diagnosis not present

## 2018-09-15 DIAGNOSIS — R269 Unspecified abnormalities of gait and mobility: Secondary | ICD-10-CM | POA: Diagnosis not present

## 2018-09-15 DIAGNOSIS — E559 Vitamin D deficiency, unspecified: Secondary | ICD-10-CM | POA: Diagnosis not present

## 2018-09-16 DIAGNOSIS — Z7982 Long term (current) use of aspirin: Secondary | ICD-10-CM | POA: Diagnosis not present

## 2018-09-16 DIAGNOSIS — R269 Unspecified abnormalities of gait and mobility: Secondary | ICD-10-CM | POA: Diagnosis not present

## 2018-09-16 DIAGNOSIS — I10 Essential (primary) hypertension: Secondary | ICD-10-CM | POA: Diagnosis not present

## 2018-09-16 DIAGNOSIS — E559 Vitamin D deficiency, unspecified: Secondary | ICD-10-CM | POA: Diagnosis not present

## 2018-09-16 DIAGNOSIS — F0390 Unspecified dementia without behavioral disturbance: Secondary | ICD-10-CM | POA: Diagnosis not present

## 2018-09-18 DIAGNOSIS — F0390 Unspecified dementia without behavioral disturbance: Secondary | ICD-10-CM | POA: Diagnosis not present

## 2018-09-18 DIAGNOSIS — E559 Vitamin D deficiency, unspecified: Secondary | ICD-10-CM | POA: Diagnosis not present

## 2018-09-18 DIAGNOSIS — R269 Unspecified abnormalities of gait and mobility: Secondary | ICD-10-CM | POA: Diagnosis not present

## 2018-09-18 DIAGNOSIS — Z7982 Long term (current) use of aspirin: Secondary | ICD-10-CM | POA: Diagnosis not present

## 2018-09-18 DIAGNOSIS — I10 Essential (primary) hypertension: Secondary | ICD-10-CM | POA: Diagnosis not present

## 2018-09-22 DIAGNOSIS — E559 Vitamin D deficiency, unspecified: Secondary | ICD-10-CM | POA: Diagnosis not present

## 2018-09-22 DIAGNOSIS — Z7982 Long term (current) use of aspirin: Secondary | ICD-10-CM | POA: Diagnosis not present

## 2018-09-22 DIAGNOSIS — F0390 Unspecified dementia without behavioral disturbance: Secondary | ICD-10-CM | POA: Diagnosis not present

## 2018-09-22 DIAGNOSIS — R269 Unspecified abnormalities of gait and mobility: Secondary | ICD-10-CM | POA: Diagnosis not present

## 2018-09-22 DIAGNOSIS — I10 Essential (primary) hypertension: Secondary | ICD-10-CM | POA: Diagnosis not present

## 2018-09-25 DIAGNOSIS — F0390 Unspecified dementia without behavioral disturbance: Secondary | ICD-10-CM | POA: Diagnosis not present

## 2018-09-25 DIAGNOSIS — I10 Essential (primary) hypertension: Secondary | ICD-10-CM | POA: Diagnosis not present

## 2018-09-25 DIAGNOSIS — E559 Vitamin D deficiency, unspecified: Secondary | ICD-10-CM | POA: Diagnosis not present

## 2018-09-25 DIAGNOSIS — Z7982 Long term (current) use of aspirin: Secondary | ICD-10-CM | POA: Diagnosis not present

## 2018-09-25 DIAGNOSIS — R269 Unspecified abnormalities of gait and mobility: Secondary | ICD-10-CM | POA: Diagnosis not present

## 2018-09-29 DIAGNOSIS — E559 Vitamin D deficiency, unspecified: Secondary | ICD-10-CM | POA: Diagnosis not present

## 2018-09-29 DIAGNOSIS — Z7982 Long term (current) use of aspirin: Secondary | ICD-10-CM | POA: Diagnosis not present

## 2018-09-29 DIAGNOSIS — I10 Essential (primary) hypertension: Secondary | ICD-10-CM | POA: Diagnosis not present

## 2018-09-29 DIAGNOSIS — F0390 Unspecified dementia without behavioral disturbance: Secondary | ICD-10-CM | POA: Diagnosis not present

## 2018-09-29 DIAGNOSIS — R269 Unspecified abnormalities of gait and mobility: Secondary | ICD-10-CM | POA: Diagnosis not present

## 2018-10-13 DIAGNOSIS — I10 Essential (primary) hypertension: Secondary | ICD-10-CM | POA: Diagnosis not present

## 2018-10-13 DIAGNOSIS — E559 Vitamin D deficiency, unspecified: Secondary | ICD-10-CM | POA: Diagnosis not present

## 2018-10-13 DIAGNOSIS — Z7982 Long term (current) use of aspirin: Secondary | ICD-10-CM | POA: Diagnosis not present

## 2018-10-13 DIAGNOSIS — R269 Unspecified abnormalities of gait and mobility: Secondary | ICD-10-CM | POA: Diagnosis not present

## 2018-10-13 DIAGNOSIS — F0390 Unspecified dementia without behavioral disturbance: Secondary | ICD-10-CM | POA: Diagnosis not present

## 2018-10-14 DIAGNOSIS — E559 Vitamin D deficiency, unspecified: Secondary | ICD-10-CM | POA: Diagnosis not present

## 2018-10-14 DIAGNOSIS — I10 Essential (primary) hypertension: Secondary | ICD-10-CM | POA: Diagnosis not present

## 2018-10-14 DIAGNOSIS — R269 Unspecified abnormalities of gait and mobility: Secondary | ICD-10-CM | POA: Diagnosis not present

## 2018-10-14 DIAGNOSIS — F0390 Unspecified dementia without behavioral disturbance: Secondary | ICD-10-CM | POA: Diagnosis not present

## 2018-10-14 DIAGNOSIS — Z7982 Long term (current) use of aspirin: Secondary | ICD-10-CM | POA: Diagnosis not present

## 2018-10-15 DIAGNOSIS — E559 Vitamin D deficiency, unspecified: Secondary | ICD-10-CM | POA: Diagnosis not present

## 2018-10-15 DIAGNOSIS — Z79899 Other long term (current) drug therapy: Secondary | ICD-10-CM | POA: Diagnosis not present

## 2018-10-15 DIAGNOSIS — I10 Essential (primary) hypertension: Secondary | ICD-10-CM | POA: Diagnosis not present

## 2018-10-15 DIAGNOSIS — G309 Alzheimer's disease, unspecified: Secondary | ICD-10-CM | POA: Diagnosis not present

## 2018-10-16 DIAGNOSIS — R269 Unspecified abnormalities of gait and mobility: Secondary | ICD-10-CM | POA: Diagnosis not present

## 2018-10-16 DIAGNOSIS — E559 Vitamin D deficiency, unspecified: Secondary | ICD-10-CM | POA: Diagnosis not present

## 2018-10-16 DIAGNOSIS — F0390 Unspecified dementia without behavioral disturbance: Secondary | ICD-10-CM | POA: Diagnosis not present

## 2018-10-16 DIAGNOSIS — I10 Essential (primary) hypertension: Secondary | ICD-10-CM | POA: Diagnosis not present

## 2018-10-16 DIAGNOSIS — Z7982 Long term (current) use of aspirin: Secondary | ICD-10-CM | POA: Diagnosis not present

## 2018-10-17 DIAGNOSIS — E559 Vitamin D deficiency, unspecified: Secondary | ICD-10-CM | POA: Diagnosis not present

## 2018-10-17 DIAGNOSIS — Z7982 Long term (current) use of aspirin: Secondary | ICD-10-CM | POA: Diagnosis not present

## 2018-10-17 DIAGNOSIS — R269 Unspecified abnormalities of gait and mobility: Secondary | ICD-10-CM | POA: Diagnosis not present

## 2018-10-17 DIAGNOSIS — I10 Essential (primary) hypertension: Secondary | ICD-10-CM | POA: Diagnosis not present

## 2018-10-17 DIAGNOSIS — F0390 Unspecified dementia without behavioral disturbance: Secondary | ICD-10-CM | POA: Diagnosis not present

## 2018-10-20 DIAGNOSIS — R269 Unspecified abnormalities of gait and mobility: Secondary | ICD-10-CM | POA: Diagnosis not present

## 2018-10-20 DIAGNOSIS — I10 Essential (primary) hypertension: Secondary | ICD-10-CM | POA: Diagnosis not present

## 2018-10-20 DIAGNOSIS — Z7982 Long term (current) use of aspirin: Secondary | ICD-10-CM | POA: Diagnosis not present

## 2018-10-20 DIAGNOSIS — E559 Vitamin D deficiency, unspecified: Secondary | ICD-10-CM | POA: Diagnosis not present

## 2018-10-20 DIAGNOSIS — F0390 Unspecified dementia without behavioral disturbance: Secondary | ICD-10-CM | POA: Diagnosis not present

## 2018-10-21 DIAGNOSIS — Z79899 Other long term (current) drug therapy: Secondary | ICD-10-CM | POA: Diagnosis not present

## 2018-10-22 DIAGNOSIS — Z7982 Long term (current) use of aspirin: Secondary | ICD-10-CM | POA: Diagnosis not present

## 2018-10-22 DIAGNOSIS — F0390 Unspecified dementia without behavioral disturbance: Secondary | ICD-10-CM | POA: Diagnosis not present

## 2018-10-22 DIAGNOSIS — I10 Essential (primary) hypertension: Secondary | ICD-10-CM | POA: Diagnosis not present

## 2018-10-22 DIAGNOSIS — E559 Vitamin D deficiency, unspecified: Secondary | ICD-10-CM | POA: Diagnosis not present

## 2018-10-22 DIAGNOSIS — R269 Unspecified abnormalities of gait and mobility: Secondary | ICD-10-CM | POA: Diagnosis not present

## 2018-10-23 DIAGNOSIS — Z7982 Long term (current) use of aspirin: Secondary | ICD-10-CM | POA: Diagnosis not present

## 2018-10-23 DIAGNOSIS — I10 Essential (primary) hypertension: Secondary | ICD-10-CM | POA: Diagnosis not present

## 2018-10-23 DIAGNOSIS — R269 Unspecified abnormalities of gait and mobility: Secondary | ICD-10-CM | POA: Diagnosis not present

## 2018-10-23 DIAGNOSIS — F0390 Unspecified dementia without behavioral disturbance: Secondary | ICD-10-CM | POA: Diagnosis not present

## 2018-10-23 DIAGNOSIS — E559 Vitamin D deficiency, unspecified: Secondary | ICD-10-CM | POA: Diagnosis not present

## 2018-10-24 DIAGNOSIS — F0390 Unspecified dementia without behavioral disturbance: Secondary | ICD-10-CM | POA: Diagnosis not present

## 2018-10-24 DIAGNOSIS — E559 Vitamin D deficiency, unspecified: Secondary | ICD-10-CM | POA: Diagnosis not present

## 2018-10-24 DIAGNOSIS — R269 Unspecified abnormalities of gait and mobility: Secondary | ICD-10-CM | POA: Diagnosis not present

## 2018-10-24 DIAGNOSIS — Z7982 Long term (current) use of aspirin: Secondary | ICD-10-CM | POA: Diagnosis not present

## 2018-10-24 DIAGNOSIS — I10 Essential (primary) hypertension: Secondary | ICD-10-CM | POA: Diagnosis not present

## 2018-10-27 DIAGNOSIS — Z7982 Long term (current) use of aspirin: Secondary | ICD-10-CM | POA: Diagnosis not present

## 2018-10-27 DIAGNOSIS — F0390 Unspecified dementia without behavioral disturbance: Secondary | ICD-10-CM | POA: Diagnosis not present

## 2018-10-27 DIAGNOSIS — R269 Unspecified abnormalities of gait and mobility: Secondary | ICD-10-CM | POA: Diagnosis not present

## 2018-10-27 DIAGNOSIS — E559 Vitamin D deficiency, unspecified: Secondary | ICD-10-CM | POA: Diagnosis not present

## 2018-10-27 DIAGNOSIS — I10 Essential (primary) hypertension: Secondary | ICD-10-CM | POA: Diagnosis not present

## 2018-10-28 DIAGNOSIS — F0390 Unspecified dementia without behavioral disturbance: Secondary | ICD-10-CM | POA: Diagnosis not present

## 2018-10-28 DIAGNOSIS — I10 Essential (primary) hypertension: Secondary | ICD-10-CM | POA: Diagnosis not present

## 2018-10-28 DIAGNOSIS — R269 Unspecified abnormalities of gait and mobility: Secondary | ICD-10-CM | POA: Diagnosis not present

## 2018-10-28 DIAGNOSIS — E559 Vitamin D deficiency, unspecified: Secondary | ICD-10-CM | POA: Diagnosis not present

## 2018-10-28 DIAGNOSIS — Z7982 Long term (current) use of aspirin: Secondary | ICD-10-CM | POA: Diagnosis not present

## 2018-10-30 DIAGNOSIS — R269 Unspecified abnormalities of gait and mobility: Secondary | ICD-10-CM | POA: Diagnosis not present

## 2018-10-30 DIAGNOSIS — E559 Vitamin D deficiency, unspecified: Secondary | ICD-10-CM | POA: Diagnosis not present

## 2018-10-30 DIAGNOSIS — F0390 Unspecified dementia without behavioral disturbance: Secondary | ICD-10-CM | POA: Diagnosis not present

## 2018-10-30 DIAGNOSIS — I10 Essential (primary) hypertension: Secondary | ICD-10-CM | POA: Diagnosis not present

## 2018-10-30 DIAGNOSIS — Z7982 Long term (current) use of aspirin: Secondary | ICD-10-CM | POA: Diagnosis not present

## 2018-10-31 DIAGNOSIS — I10 Essential (primary) hypertension: Secondary | ICD-10-CM | POA: Diagnosis not present

## 2018-10-31 DIAGNOSIS — R269 Unspecified abnormalities of gait and mobility: Secondary | ICD-10-CM | POA: Diagnosis not present

## 2018-10-31 DIAGNOSIS — F0390 Unspecified dementia without behavioral disturbance: Secondary | ICD-10-CM | POA: Diagnosis not present

## 2018-10-31 DIAGNOSIS — Z7982 Long term (current) use of aspirin: Secondary | ICD-10-CM | POA: Diagnosis not present

## 2018-10-31 DIAGNOSIS — E559 Vitamin D deficiency, unspecified: Secondary | ICD-10-CM | POA: Diagnosis not present

## 2018-11-03 DIAGNOSIS — E559 Vitamin D deficiency, unspecified: Secondary | ICD-10-CM | POA: Diagnosis not present

## 2018-11-03 DIAGNOSIS — F0390 Unspecified dementia without behavioral disturbance: Secondary | ICD-10-CM | POA: Diagnosis not present

## 2018-11-03 DIAGNOSIS — Z7982 Long term (current) use of aspirin: Secondary | ICD-10-CM | POA: Diagnosis not present

## 2018-11-03 DIAGNOSIS — R269 Unspecified abnormalities of gait and mobility: Secondary | ICD-10-CM | POA: Diagnosis not present

## 2018-11-03 DIAGNOSIS — I10 Essential (primary) hypertension: Secondary | ICD-10-CM | POA: Diagnosis not present

## 2018-11-05 DIAGNOSIS — F0390 Unspecified dementia without behavioral disturbance: Secondary | ICD-10-CM | POA: Diagnosis not present

## 2018-11-05 DIAGNOSIS — R269 Unspecified abnormalities of gait and mobility: Secondary | ICD-10-CM | POA: Diagnosis not present

## 2018-11-05 DIAGNOSIS — Z7982 Long term (current) use of aspirin: Secondary | ICD-10-CM | POA: Diagnosis not present

## 2018-11-05 DIAGNOSIS — E559 Vitamin D deficiency, unspecified: Secondary | ICD-10-CM | POA: Diagnosis not present

## 2018-11-05 DIAGNOSIS — I10 Essential (primary) hypertension: Secondary | ICD-10-CM | POA: Diagnosis not present

## 2018-11-06 DIAGNOSIS — R269 Unspecified abnormalities of gait and mobility: Secondary | ICD-10-CM | POA: Diagnosis not present

## 2018-11-06 DIAGNOSIS — I10 Essential (primary) hypertension: Secondary | ICD-10-CM | POA: Diagnosis not present

## 2018-11-06 DIAGNOSIS — F0390 Unspecified dementia without behavioral disturbance: Secondary | ICD-10-CM | POA: Diagnosis not present

## 2018-11-06 DIAGNOSIS — E559 Vitamin D deficiency, unspecified: Secondary | ICD-10-CM | POA: Diagnosis not present

## 2018-11-06 DIAGNOSIS — Z7982 Long term (current) use of aspirin: Secondary | ICD-10-CM | POA: Diagnosis not present

## 2018-11-10 DIAGNOSIS — I10 Essential (primary) hypertension: Secondary | ICD-10-CM | POA: Diagnosis not present

## 2018-11-10 DIAGNOSIS — R269 Unspecified abnormalities of gait and mobility: Secondary | ICD-10-CM | POA: Diagnosis not present

## 2018-11-10 DIAGNOSIS — F0390 Unspecified dementia without behavioral disturbance: Secondary | ICD-10-CM | POA: Diagnosis not present

## 2018-11-10 DIAGNOSIS — E559 Vitamin D deficiency, unspecified: Secondary | ICD-10-CM | POA: Diagnosis not present

## 2018-11-10 DIAGNOSIS — Z7982 Long term (current) use of aspirin: Secondary | ICD-10-CM | POA: Diagnosis not present

## 2018-11-12 DIAGNOSIS — F0390 Unspecified dementia without behavioral disturbance: Secondary | ICD-10-CM | POA: Diagnosis not present

## 2018-11-12 DIAGNOSIS — I10 Essential (primary) hypertension: Secondary | ICD-10-CM | POA: Diagnosis not present

## 2018-11-12 DIAGNOSIS — R269 Unspecified abnormalities of gait and mobility: Secondary | ICD-10-CM | POA: Diagnosis not present

## 2018-11-12 DIAGNOSIS — E559 Vitamin D deficiency, unspecified: Secondary | ICD-10-CM | POA: Diagnosis not present

## 2018-11-12 DIAGNOSIS — Z7982 Long term (current) use of aspirin: Secondary | ICD-10-CM | POA: Diagnosis not present

## 2018-11-18 DIAGNOSIS — F0391 Unspecified dementia with behavioral disturbance: Secondary | ICD-10-CM | POA: Diagnosis not present

## 2018-11-20 DIAGNOSIS — E559 Vitamin D deficiency, unspecified: Secondary | ICD-10-CM | POA: Diagnosis not present

## 2018-11-20 DIAGNOSIS — F339 Major depressive disorder, recurrent, unspecified: Secondary | ICD-10-CM | POA: Diagnosis not present

## 2018-11-20 DIAGNOSIS — I10 Essential (primary) hypertension: Secondary | ICD-10-CM | POA: Diagnosis not present

## 2018-11-20 DIAGNOSIS — Z79899 Other long term (current) drug therapy: Secondary | ICD-10-CM | POA: Diagnosis not present

## 2018-11-20 DIAGNOSIS — G309 Alzheimer's disease, unspecified: Secondary | ICD-10-CM | POA: Diagnosis not present

## 2018-12-04 DIAGNOSIS — Z79899 Other long term (current) drug therapy: Secondary | ICD-10-CM | POA: Diagnosis not present

## 2018-12-04 DIAGNOSIS — R634 Abnormal weight loss: Secondary | ICD-10-CM | POA: Diagnosis not present

## 2018-12-04 DIAGNOSIS — F339 Major depressive disorder, recurrent, unspecified: Secondary | ICD-10-CM | POA: Diagnosis not present

## 2018-12-04 DIAGNOSIS — I10 Essential (primary) hypertension: Secondary | ICD-10-CM | POA: Diagnosis not present

## 2018-12-04 DIAGNOSIS — G309 Alzheimer's disease, unspecified: Secondary | ICD-10-CM | POA: Diagnosis not present

## 2018-12-04 DIAGNOSIS — E559 Vitamin D deficiency, unspecified: Secondary | ICD-10-CM | POA: Diagnosis not present

## 2018-12-15 DIAGNOSIS — F0391 Unspecified dementia with behavioral disturbance: Secondary | ICD-10-CM | POA: Diagnosis not present

## 2018-12-24 DIAGNOSIS — Z20828 Contact with and (suspected) exposure to other viral communicable diseases: Secondary | ICD-10-CM | POA: Diagnosis not present

## 2018-12-25 DIAGNOSIS — Z79899 Other long term (current) drug therapy: Secondary | ICD-10-CM | POA: Diagnosis not present

## 2018-12-25 DIAGNOSIS — F339 Major depressive disorder, recurrent, unspecified: Secondary | ICD-10-CM | POA: Diagnosis not present

## 2018-12-25 DIAGNOSIS — G309 Alzheimer's disease, unspecified: Secondary | ICD-10-CM | POA: Diagnosis not present

## 2018-12-25 DIAGNOSIS — R634 Abnormal weight loss: Secondary | ICD-10-CM | POA: Diagnosis not present

## 2018-12-25 DIAGNOSIS — I10 Essential (primary) hypertension: Secondary | ICD-10-CM | POA: Diagnosis not present

## 2019-01-07 ENCOUNTER — Ambulatory Visit (INDEPENDENT_AMBULATORY_CARE_PROVIDER_SITE_OTHER): Payer: Medicare Other | Admitting: Podiatry

## 2019-01-07 ENCOUNTER — Other Ambulatory Visit: Payer: Self-pay

## 2019-01-07 ENCOUNTER — Encounter: Payer: Self-pay | Admitting: Podiatry

## 2019-01-07 DIAGNOSIS — M79675 Pain in left toe(s): Secondary | ICD-10-CM

## 2019-01-07 DIAGNOSIS — M79674 Pain in right toe(s): Secondary | ICD-10-CM

## 2019-01-07 DIAGNOSIS — B351 Tinea unguium: Secondary | ICD-10-CM

## 2019-01-07 NOTE — Progress Notes (Addendum)
Complaint:  Visit Type: Patient returns to my office for continued preventative foot care services. Complaint: Patient states" my nails have grown long and thick and become painful to walk and wear shoes"  The patient presents for preventative foot care services. No changes to ROS.  Patient has not been seen for over 1 year.  Podiatric Exam: Vascular: dorsalis pedis and posterior tibial pulses are palpable bilateral. Capillary return is immediate. Temperature gradient is WNL. Skin turgor WNL  Sensorium: Normal Semmes Weinstein monofilament test. Normal tactile sensation bilaterally. Nail Exam: Pt has thick disfigured discolored nails with subungual debris noted bilateral entire nail hallux through fifth toenails Ulcer Exam: There is no evidence of ulcer or pre-ulcerative changes or infection. Orthopedic Exam: Muscle tone and strength are WNL. No limitations in general ROM. No crepitus or effusions noted. Foot type and digits show no abnormalities. Bony prominences are unremarkable. Skin: No Porokeratosis. No infection or ulcers  Diagnosis:  Onychomycosis, , Pain in right toe, pain in left toes  Treatment & Plan Procedures and Treatment: Consent by patient was obtained for treatment procedures.   Debridement of mycotic and hypertrophic toenails, 1 through 5 bilateral and clearing of subungual debris. No ulceration, no infection noted.  Return Visit-Office Procedure: Patient instructed to return to the office for a follow up visit 3 months for continued evaluation and treatment.    Gardiner Barefoot DPM

## 2019-01-08 DIAGNOSIS — G309 Alzheimer's disease, unspecified: Secondary | ICD-10-CM | POA: Diagnosis not present

## 2019-01-08 DIAGNOSIS — F339 Major depressive disorder, recurrent, unspecified: Secondary | ICD-10-CM | POA: Diagnosis not present

## 2019-01-08 DIAGNOSIS — I10 Essential (primary) hypertension: Secondary | ICD-10-CM | POA: Diagnosis not present

## 2019-01-08 DIAGNOSIS — R634 Abnormal weight loss: Secondary | ICD-10-CM | POA: Diagnosis not present

## 2019-01-08 DIAGNOSIS — Z79899 Other long term (current) drug therapy: Secondary | ICD-10-CM | POA: Diagnosis not present

## 2019-01-26 DIAGNOSIS — F0391 Unspecified dementia with behavioral disturbance: Secondary | ICD-10-CM | POA: Diagnosis not present

## 2019-02-05 DIAGNOSIS — Z79899 Other long term (current) drug therapy: Secondary | ICD-10-CM | POA: Diagnosis not present

## 2019-02-05 DIAGNOSIS — F339 Major depressive disorder, recurrent, unspecified: Secondary | ICD-10-CM | POA: Diagnosis not present

## 2019-02-05 DIAGNOSIS — R634 Abnormal weight loss: Secondary | ICD-10-CM | POA: Diagnosis not present

## 2019-02-05 DIAGNOSIS — G309 Alzheimer's disease, unspecified: Secondary | ICD-10-CM | POA: Diagnosis not present

## 2019-02-05 DIAGNOSIS — I10 Essential (primary) hypertension: Secondary | ICD-10-CM | POA: Diagnosis not present

## 2019-03-05 DIAGNOSIS — G309 Alzheimer's disease, unspecified: Secondary | ICD-10-CM | POA: Diagnosis not present

## 2019-03-05 DIAGNOSIS — R630 Anorexia: Secondary | ICD-10-CM | POA: Diagnosis not present

## 2019-03-05 DIAGNOSIS — F0391 Unspecified dementia with behavioral disturbance: Secondary | ICD-10-CM | POA: Diagnosis not present

## 2019-03-05 DIAGNOSIS — Z79899 Other long term (current) drug therapy: Secondary | ICD-10-CM | POA: Diagnosis not present

## 2019-03-05 DIAGNOSIS — I10 Essential (primary) hypertension: Secondary | ICD-10-CM | POA: Diagnosis not present

## 2019-03-09 DIAGNOSIS — F0391 Unspecified dementia with behavioral disturbance: Secondary | ICD-10-CM | POA: Diagnosis not present

## 2019-03-19 DIAGNOSIS — Z79899 Other long term (current) drug therapy: Secondary | ICD-10-CM | POA: Diagnosis not present

## 2019-03-19 DIAGNOSIS — I10 Essential (primary) hypertension: Secondary | ICD-10-CM | POA: Diagnosis not present

## 2019-03-19 DIAGNOSIS — G309 Alzheimer's disease, unspecified: Secondary | ICD-10-CM | POA: Diagnosis not present

## 2019-03-19 DIAGNOSIS — M79605 Pain in left leg: Secondary | ICD-10-CM | POA: Diagnosis not present

## 2019-03-19 DIAGNOSIS — F0391 Unspecified dementia with behavioral disturbance: Secondary | ICD-10-CM | POA: Diagnosis not present

## 2019-03-19 DIAGNOSIS — M79604 Pain in right leg: Secondary | ICD-10-CM | POA: Diagnosis not present

## 2019-04-02 DIAGNOSIS — F0391 Unspecified dementia with behavioral disturbance: Secondary | ICD-10-CM | POA: Diagnosis not present

## 2019-04-02 DIAGNOSIS — Z79899 Other long term (current) drug therapy: Secondary | ICD-10-CM | POA: Diagnosis not present

## 2019-04-02 DIAGNOSIS — M79604 Pain in right leg: Secondary | ICD-10-CM | POA: Diagnosis not present

## 2019-04-02 DIAGNOSIS — G309 Alzheimer's disease, unspecified: Secondary | ICD-10-CM | POA: Diagnosis not present

## 2019-04-02 DIAGNOSIS — I10 Essential (primary) hypertension: Secondary | ICD-10-CM | POA: Diagnosis not present

## 2019-04-23 DIAGNOSIS — G309 Alzheimer's disease, unspecified: Secondary | ICD-10-CM | POA: Diagnosis not present

## 2019-04-23 DIAGNOSIS — I10 Essential (primary) hypertension: Secondary | ICD-10-CM | POA: Diagnosis not present

## 2019-04-23 DIAGNOSIS — Z79899 Other long term (current) drug therapy: Secondary | ICD-10-CM | POA: Diagnosis not present

## 2019-04-23 DIAGNOSIS — F0391 Unspecified dementia with behavioral disturbance: Secondary | ICD-10-CM | POA: Diagnosis not present

## 2019-05-07 DIAGNOSIS — F0391 Unspecified dementia with behavioral disturbance: Secondary | ICD-10-CM | POA: Diagnosis not present

## 2019-05-07 DIAGNOSIS — M79605 Pain in left leg: Secondary | ICD-10-CM | POA: Diagnosis not present

## 2019-05-07 DIAGNOSIS — I10 Essential (primary) hypertension: Secondary | ICD-10-CM | POA: Diagnosis not present

## 2019-05-07 DIAGNOSIS — Z79899 Other long term (current) drug therapy: Secondary | ICD-10-CM | POA: Diagnosis not present

## 2019-05-07 DIAGNOSIS — M79604 Pain in right leg: Secondary | ICD-10-CM | POA: Diagnosis not present

## 2019-05-07 DIAGNOSIS — G309 Alzheimer's disease, unspecified: Secondary | ICD-10-CM | POA: Diagnosis not present

## 2019-05-12 ENCOUNTER — Telehealth: Payer: Self-pay | Admitting: *Deleted

## 2019-05-12 NOTE — Telephone Encounter (Signed)
I called pt's caregiver Consuello Masse to r/s pt's 07/01/2019 appt with Dr. Jaynee Eagles d/t provider out of office. Franchot Gallo stated she is no longer the pt's caregiver. Niece [name] has applied for guardianship and the pt is at [place] on [address].   I tried to call the main number on the chart. Phone rang multiple times, no answer, no vm option.

## 2019-05-14 NOTE — Telephone Encounter (Signed)
Dr. Jaynee Eagles will now be in the office on 12/16. Will leave appt as is.

## 2019-06-03 DIAGNOSIS — B351 Tinea unguium: Secondary | ICD-10-CM | POA: Diagnosis not present

## 2019-06-03 DIAGNOSIS — R609 Edema, unspecified: Secondary | ICD-10-CM | POA: Diagnosis not present

## 2019-06-03 DIAGNOSIS — I739 Peripheral vascular disease, unspecified: Secondary | ICD-10-CM | POA: Diagnosis not present

## 2019-06-15 DIAGNOSIS — Z79899 Other long term (current) drug therapy: Secondary | ICD-10-CM | POA: Diagnosis not present

## 2019-06-15 DIAGNOSIS — I10 Essential (primary) hypertension: Secondary | ICD-10-CM | POA: Diagnosis not present

## 2019-06-15 DIAGNOSIS — G309 Alzheimer's disease, unspecified: Secondary | ICD-10-CM | POA: Diagnosis not present

## 2019-06-15 DIAGNOSIS — F0391 Unspecified dementia with behavioral disturbance: Secondary | ICD-10-CM | POA: Diagnosis not present

## 2019-06-15 DIAGNOSIS — Z022 Encounter for examination for admission to residential institution: Secondary | ICD-10-CM | POA: Diagnosis not present

## 2019-07-01 ENCOUNTER — Ambulatory Visit: Payer: Medicare Other | Admitting: Neurology

## 2019-07-01 ENCOUNTER — Encounter: Payer: Self-pay | Admitting: Neurology

## 2019-07-26 ENCOUNTER — Emergency Department (HOSPITAL_COMMUNITY)
Admission: EM | Admit: 2019-07-26 | Discharge: 2019-07-27 | Disposition: A | Discharge status: Home / Self Care | Payer: Medicare Other | Attending: Emergency Medicine | Admitting: Emergency Medicine

## 2019-07-26 ENCOUNTER — Other Ambulatory Visit: Payer: Self-pay

## 2019-07-26 ENCOUNTER — Encounter (HOSPITAL_COMMUNITY): Payer: Self-pay

## 2019-07-26 ENCOUNTER — Emergency Department (HOSPITAL_COMMUNITY): Payer: Medicare Other

## 2019-07-26 DIAGNOSIS — I10 Essential (primary) hypertension: Secondary | ICD-10-CM | POA: Insufficient documentation

## 2019-07-26 DIAGNOSIS — R400 Somnolence: Secondary | ICD-10-CM | POA: Insufficient documentation

## 2019-07-26 DIAGNOSIS — F0390 Unspecified dementia without behavioral disturbance: Secondary | ICD-10-CM | POA: Diagnosis not present

## 2019-07-26 DIAGNOSIS — R251 Tremor, unspecified: Secondary | ICD-10-CM | POA: Diagnosis not present

## 2019-07-26 DIAGNOSIS — R4182 Altered mental status, unspecified: Secondary | ICD-10-CM | POA: Diagnosis present

## 2019-07-26 LAB — URINALYSIS, ROUTINE W REFLEX MICROSCOPIC
Bacteria, UA: NONE SEEN
Bilirubin Urine: NEGATIVE
Glucose, UA: NEGATIVE mg/dL
Hgb urine dipstick: NEGATIVE
Ketones, ur: NEGATIVE mg/dL
Leukocytes,Ua: NEGATIVE
Nitrite: NEGATIVE
Protein, ur: 100 mg/dL — AB
Specific Gravity, Urine: 1.026 (ref 1.005–1.030)
pH: 5 (ref 5.0–8.0)

## 2019-07-26 LAB — BASIC METABOLIC PANEL
Anion gap: 9 (ref 5–15)
BUN: 22 mg/dL (ref 8–23)
CO2: 26 mmol/L (ref 22–32)
Calcium: 9.4 mg/dL (ref 8.9–10.3)
Chloride: 105 mmol/L (ref 98–111)
Creatinine, Ser: 1.24 mg/dL — ABNORMAL HIGH (ref 0.44–1.00)
GFR calc Af Amer: 45 mL/min — ABNORMAL LOW (ref >60)
GFR calc non Af Amer: 39 mL/min — ABNORMAL LOW (ref >60)
Glucose, Bld: 211 mg/dL — ABNORMAL HIGH (ref 70–99)
Potassium: 4.1 mmol/L (ref 3.5–5.1)
Sodium: 140 mmol/L (ref 135–145)

## 2019-07-26 LAB — CBC
HCT: 32.2 % — ABNORMAL LOW (ref 36.0–46.0)
Hemoglobin: 9.4 g/dL — ABNORMAL LOW (ref 12.0–15.0)
MCH: 25.3 pg — ABNORMAL LOW (ref 26.0–34.0)
MCHC: 29.2 g/dL — ABNORMAL LOW (ref 30.0–36.0)
MCV: 86.6 fL (ref 80.0–100.0)
Platelets: 237 10*3/uL (ref 150–400)
RBC: 3.72 MIL/uL — ABNORMAL LOW (ref 3.87–5.11)
RDW: 19.9 % — ABNORMAL HIGH (ref 11.5–15.5)
WBC: 6.1 10*3/uL (ref 4.0–10.5)
nRBC: 2.1 % — ABNORMAL HIGH (ref 0.0–0.2)

## 2019-07-26 MED ORDER — SODIUM CHLORIDE 0.9% FLUSH: 3.0000 mL | Freq: Once | INTRAVENOUS | Status: DC

## 2019-07-26 NOTE — ED Provider Notes (Signed)
[facility] Emergency Department Provider Note MRN:  [id number]  Arrival date & time: 07/26/19     Chief Complaint   Loss of Consciousness   History of Present Illness   Shawna Lin is a 86 y.o. year-old female with a history of hypertension presenting to the ED with chief complaint of loss of consciousness.  History obtained from [facility] employee as patient has dementia.  Employee was checking on patient after dinner and noticed that patient's had slumped over to the right against the wall and she began having full body tremulous activity.  After a few minutes the shaking activity stopped and then she was somnolent or sleepy for a few minutes, after which she was back to normal.  No recent fever or cough, patient currently feels fine, no complaints, does not remember what happened.  I was unable to obtain an accurate HPI, PMH, or ROS due to the patient's altered mental status.  Level 5 caveat.  Review of Systems  Positive for loss of consciousness, shaking behavior.  Patient's Health History    Past Medical History:  Diagnosis Date  . Hypertension   . Memory loss   . Vitamin D deficiency     Past Surgical History:  Procedure Laterality Date  . UTERINE FIBROID SURGERY      Family History  Problem Relation Age of Onset  . Heart attack Father   . Hypertension Mother   . Colon cancer Brother   . Hypertension Brother   . Diabetes Sister   . Kidney failure Sister   . Hypertension Sister   . Dementia Neg Hx     Social History   Socioeconomic History  . Marital status: Single    Spouse name: Not on file  . Number of children: 0  . Years of education: 46  . Highest education level: Not on file  Occupational History  . Occupation: Retired  Tobacco Use  . Smoking status: Never Smoker  . Smokeless tobacco: Never Used  Substance and Sexual Activity  . Alcohol use: No    Alcohol/week: 0.0 standard drinks  . Drug use: No  .  Sexual activity: Not on file  Other Topics Concern  . Not on file  Social History Narrative   Lives alone, has a caregiver that lives closeby   Caffeine use: 2 cups coffee per day   Social Determinants of Health   Financial Resource Strain:   . Difficulty of Paying Living Expenses: Not on file  Food Insecurity:   . Worried About Charity fundraiser in the Last Year: Not on file  . Ran Out of Food in the Last Year: Not on file  Transportation Needs:   . Lack of Transportation (Medical): Not on file  . Lack of Transportation (Non-Medical): Not on file  Physical Activity:   . Days of Exercise per Week: Not on file  . Minutes of Exercise per Session: Not on file  Stress:   . Feeling of Stress : Not on file  Social Connections:   . Frequency of Communication with Friends and Family: Not on file  . Frequency of Social Gatherings with Friends and Family: Not on file  . Attends Religious Services: Not on file  . Active Member of Clubs or Organizations: Not on file  . Attends Archivist Meetings: Not on file  . Marital Status: Not on file  Intimate Partner Violence:   . Fear of Current or Ex-Partner: Not on file  .  Emotionally Abused: Not on file  . Physically Abused: Not on file  . Sexually Abused: Not on file     Physical Exam  Vital Signs and Nursing Notes reviewed Vitals:   07/26/19 2115 07/26/19 2130  BP: (!) 148/71 (!) 157/69  Pulse: 87 88  Resp: 18 (!) 22  Temp:    SpO2: 98% 98%    CONSTITUTIONAL: Well-appearing, NAD NEURO: Alert and oriented to name only, normal and symmetric strength and sensation, normal coordination, normal speech EYES:  eyes equal and reactive ENT/NECK:  no LAD, no JVD CARDIO: Regular rate, well-perfused, normal S1 and S2 PULM:  CTAB no wheezing or rhonchi GI/GU:  normal bowel sounds, non-distended, non-tender MSK/SPINE:  No gross deformities, no edema SKIN:  no rash, atraumatic PSYCH:  Appropriate speech and behavior  Diagnostic  and Interventional Summary    EKG Interpretation  Date/Time:  Sunday July 26 2019 18:34:29 EST Ventricular Rate:  64 PR Interval:  142 QRS Duration: 92 QT Interval:  440 QTC Calculation: 453 R Axis:   113 Text Interpretation: Normal sinus rhythm Left posterior fascicular block Abnormal ECG Confirmed by Gerlene Fee [phone]) on 07/26/2019 9:22:15 PM      Labs Reviewed  BASIC METABOLIC PANEL - Abnormal; Notable for the following components:      Result Value   Glucose, Bld 211 (*)    Creatinine, Ser 1.24 (*)    GFR calc non Af Amer 39 (*)    GFR calc Af Amer 45 (*)    All other components within normal limits  CBC - Abnormal; Notable for the following components:   RBC 3.72 (*)    Hemoglobin 9.4 (*)    HCT 32.2 (*)    MCH 25.3 (*)    MCHC 29.2 (*)    RDW 19.9 (*)    nRBC 2.1 (*)    All other components within normal limits  URINALYSIS, ROUTINE W REFLEX MICROSCOPIC - Abnormal; Notable for the following components:   APPearance HAZY (*)    Protein, ur 100 (*)    All other components within normal limits  CBG MONITORING, ED    CT Head Wo Contrast  Final Result      Medications  sodium chloride flush (NS) 0.9 % injection 3 mL (3 mLs Intravenous Not Given 07/26/19 2113)     Procedures  /  Critical Care Procedures  ED Course and Medical Decision Making  I have reviewed the triage vital signs, the nursing notes, and pertinent available records from the EMR.  Pertinent labs & imaging results that were available during my care of the patient were reviewed by me and considered in my medical decision making (see below for details).     Patient has a history of atypical syncope and per chart review has had similar presentations.  She has a reassuring neurological exam at this time, normal vital signs, no fever, no meningismus.  Does not have a reported history of seizures.  Will need CT head to exclude intracranial mass or other significant process.  Work-up is otherwise  reassuring and patient is likely appropriate for discharge with PCP follow-up.    Barth Kirks. Sedonia Small, [place] [email or web address]  Final Clinical Impressions(s) / ED Diagnoses     ICD-10-CM   1. Episode of shaking  R25.1     ED Discharge Orders    None       Discharge Instructions Discussed with and Provided to Patient:  Discharge Instructions     You were evaluated in the Emergency Department and after careful evaluation, we did not find any emergent condition requiring admission or further testing in the hospital.  Your exam/testing today is overall reassuring.  Please return to the Emergency Department if you experience any worsening of your condition.  We encourage you to follow up with a primary care provider.  Thank you for allowing [place] to be a part of your care.       Maudie Flakes, MD 07/26/19 [phone]

## 2019-07-26 NOTE — ED Notes (Signed)
Assisted patient back to bed from using the restroom. Patient ambulated with minimum assistance. Patient stated "I've been here since early this morning, and I have been waiting for the doctor for over an hour now." Pt appeared to be agitated. Re-oriented patient to time and place and explained she arrived in ER around 7 pm. Pt was surprise with the time, explained to patient episode of confusion and patient stated " I have some forgetfulness periodically." patient is again re-orientated to time and place; monitors applied and updated patient of plan of CT of head. VSS. Patient appears to understand current situation. Bed placed in lowest position, side rails up for safety, call light in reach.

## 2019-07-26 NOTE — ED Notes (Signed)
Patient transported to CT 

## 2019-07-26 NOTE — ED Triage Notes (Signed)
To triage via EMS.  Per staff at [facility] pt had witnessed unresponsive episode with upper body tremors, lasted 3 min.  EMS BP 128/64 HR 64 SpO2 96% room air Temp 98.7 CBG 118 Pt has no complaints.   A&O x 3, not to time  Uses walker at home

## 2019-07-26 NOTE — ED Notes (Signed)
[facility] Called 

## 2019-07-26 NOTE — Discharge Instructions (Addendum)
You were evaluated in the Emergency Department and after careful evaluation, we did not find any emergent condition requiring admission or further testing in the hospital.  Your exam/testing today is overall reassuring.  Please return to the Emergency Department if you experience any worsening of your condition.  We encourage you to follow up with a primary care provider.  Thank you for allowing us to be a part of your care. 

## 2019-08-03 ENCOUNTER — Other Ambulatory Visit: Payer: Self-pay

## 2019-08-03 ENCOUNTER — Emergency Department (HOSPITAL_COMMUNITY)
Admission: EM | Admit: 2019-08-03 | Discharge: 2019-08-03 | Disposition: A | Discharge status: Skilled Nursing Facility | Payer: Medicare Other | Attending: Emergency Medicine | Admitting: Emergency Medicine

## 2019-08-03 DIAGNOSIS — Z7982 Long term (current) use of aspirin: Secondary | ICD-10-CM | POA: Insufficient documentation

## 2019-08-03 DIAGNOSIS — Z79899 Other long term (current) drug therapy: Secondary | ICD-10-CM | POA: Insufficient documentation

## 2019-08-03 DIAGNOSIS — R569 Unspecified convulsions: Secondary | ICD-10-CM | POA: Insufficient documentation

## 2019-08-03 DIAGNOSIS — I10 Essential (primary) hypertension: Secondary | ICD-10-CM | POA: Diagnosis not present

## 2019-08-03 LAB — COMPREHENSIVE METABOLIC PANEL
ALT: 14 U/L (ref 0–44)
AST: 24 U/L (ref 15–41)
Albumin: 3.9 g/dL (ref 3.5–5.0)
Alkaline Phosphatase: 106 U/L (ref 38–126)
Anion gap: 11 (ref 5–15)
BUN: 23 mg/dL (ref 8–23)
CO2: 25 mmol/L (ref 22–32)
Calcium: 9.2 mg/dL (ref 8.9–10.3)
Chloride: 104 mmol/L (ref 98–111)
Creatinine, Ser: 1.34 mg/dL — ABNORMAL HIGH (ref 0.44–1.00)
GFR calc Af Amer: 41 mL/min — ABNORMAL LOW (ref >60)
GFR calc non Af Amer: 35 mL/min — ABNORMAL LOW (ref >60)
Glucose, Bld: 207 mg/dL — ABNORMAL HIGH (ref 70–99)
Potassium: 3.5 mmol/L (ref 3.5–5.1)
Sodium: 140 mmol/L (ref 135–145)
Total Bilirubin: 0.3 mg/dL (ref 0.3–1.2)
Total Protein: 6.7 g/dL (ref 6.5–8.1)

## 2019-08-03 LAB — CBC WITH DIFFERENTIAL/PLATELET
Abs Immature Granulocytes: 0.15 10*3/uL — ABNORMAL HIGH (ref 0.00–0.07)
Basophils Absolute: 0 10*3/uL (ref 0.0–0.1)
Basophils Relative: 0 %
Eosinophils Absolute: 0.1 10*3/uL (ref 0.0–0.5)
Eosinophils Relative: 2 %
HCT: 32.4 % — ABNORMAL LOW (ref 36.0–46.0)
Hemoglobin: 9.7 g/dL — ABNORMAL LOW (ref 12.0–15.0)
Immature Granulocytes: 2 %
Lymphocytes Relative: 32 %
Lymphs Abs: 2.3 10*3/uL (ref 0.7–4.0)
MCH: 25.5 pg — ABNORMAL LOW (ref 26.0–34.0)
MCHC: 29.9 g/dL — ABNORMAL LOW (ref 30.0–36.0)
MCV: 85.3 fL (ref 80.0–100.0)
Monocytes Absolute: 0.7 10*3/uL (ref 0.1–1.0)
Monocytes Relative: 10 %
Neutro Abs: 3.8 10*3/uL (ref 1.7–7.7)
Neutrophils Relative %: 54 %
Platelets: 227 10*3/uL (ref 150–400)
RBC: 3.8 MIL/uL — ABNORMAL LOW (ref 3.87–5.11)
RDW: 19.7 % — ABNORMAL HIGH (ref 11.5–15.5)
WBC: 7.1 10*3/uL (ref 4.0–10.5)
nRBC: 2.7 % — ABNORMAL HIGH (ref 0.0–0.2)

## 2019-08-03 LAB — MAGNESIUM: Magnesium: 2 mg/dL (ref 1.7–2.4)

## 2019-08-03 MED ORDER — LEVETIRACETAM 500 MG PO TABS
500.0000 mg | ORAL_TABLET | Freq: Once | ORAL | Status: AC
  Administered 2019-08-03: 500 mg via ORAL
  Filled 2019-08-03: qty 1

## 2019-08-03 MED ORDER — LEVETIRACETAM 250 MG PO TABS: 250.0000 mg | ORAL_TABLET | Freq: Two times a day (BID) | ORAL | 0 refills | Status: DC

## 2019-08-03 NOTE — ED Triage Notes (Signed)
Arrives via EMS from [facility] [place], C/C potential syncopal episode, she was eating and staff found her unconscious in the chair. CBG 205. BP 156/82, HR 68, RR 18, 96%RA, 97.6 temporal

## 2019-08-03 NOTE — ED Notes (Signed)
[facility] contacted for transportation. Paperwork and discharge paper placed in patient's bin.

## 2019-08-03 NOTE — ED Notes (Signed)
Gave pt a dinner tray (she ate very little) and a cookie and some peaches to go.  Pt also given some water.  Family member notified that pt is being d/c.  [facility] arrived and loaded pt up to take back

## 2019-08-03 NOTE — Discharge Instructions (Signed)
Please have Prudie follow up with her family doctor in one week for recheck her kidney function.

## 2019-08-03 NOTE — ED Notes (Signed)
Pt able to ambulate with walker to and form the restroom without issue or complaint

## 2019-08-03 NOTE — ED Provider Notes (Signed)
[facility] DEPT Provider Note   CSN: [id number] Arrival date & time: 08/03/19  1746     History Chief Complaint  Patient presents with  . Loss of Consciousness    Shawna Lin is a 86 y.o. female.  The history is provided by the patient, medical records, the EMS personnel and the nursing home. No language interpreter was used.  Loss of Consciousness  Shawna Lin is a 86 y.o. female who presents to the Emergency Department complaining of syncope.  Level V caveat due to dementia.  Hx is provided by EMS and [facility] staff.  She had finished eating dinner and she was slumped over with her head on the table and not responded.  Her eye were back in her head and her lips were white. Episode lasted 10-15 minutes.  After awoke she was a little out of it.  Initially completely unresponsive and then generalized shaking for 5 minutes. NH reports increased sleeping lately, otherwise no change.  She had a similar episode a few weeks back with heavy breathing, NH concerned for possible seizure.  These events are new.  Normally ambulates with a walker.     Patient reports feeling well, does not recall the events that occurred earlier today. She does remember taking an ambulance to the emergency department. She states that she was told that she passed out. Past Medical History:  Diagnosis Date  . Hypertension   . Memory loss   . Vitamin D deficiency     Patient Active Problem List   Diagnosis Date Noted  . Pain due to onychomycosis of toenails of both feet 01/07/2019  . Syncope 12/21/2015  . Dementia ([place]) 12/21/2015  . No blood products 12/21/2015  . 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 Memory loss 09/02/2015  . HTN (hypertension) 09/02/2015    Past Surgical History:  Procedure Laterality Date  . UTERINE FIBROID SURGERY       OB History   No obstetric history on file.     Family History  Problem Relation Age of Onset  . Heart attack  Father   . Hypertension Mother   . Colon cancer Brother   . Hypertension Brother   . Diabetes Sister   . Kidney failure Sister   . Hypertension Sister   . Dementia Neg Hx     Social History   Tobacco Use  . Smoking status: Never Smoker  . Smokeless tobacco: Never Used  Substance Use Topics  . Alcohol use: No    Alcohol/week: 0.0 standard drinks  . Drug use: No    Home Medications Prior to Admission medications   Medication Sig Start Date End Date Taking? Authorizing Provider  acetaminophen (TYLENOL) 325 MG tablet Take 650 mg by mouth every 12 (twelve) hours as needed (knee pain).    Yes [provider]  amLODipine (NORVASC) 2.5 MG tablet Take 2.5 mg by mouth daily. 12/12/17  Yes [provider]  Aspirin 81 MG EC tablet Take 81 mg by mouth daily.   Yes [provider]  cholecalciferol (VITAMIN D3) 25 MCG (1000 UNIT) tablet Take 1,000 Units by mouth daily.   Yes [provider]  Ensure (ENSURE) Take 237 mLs by mouth 3 (three) times daily.   Yes [provider]  memantine (NAMENDA) 10 MG tablet Take 1 tablet (10 mg total) by mouth 2 (two) times daily. 06/26/18  Yes Melvenia Beam, MD  OLANZapine (ZYPREXA) 2.5 MG tablet Take 2.5 mg by mouth at bedtime.  Yes [provider]  donepezil (ARICEPT) 10 MG tablet TAKE ONE TABLET BY MOUTH AT BEDTIME Patient taking differently: Take 10 mg by mouth at bedtime.  12/18/16   Melvenia Beam, MD  hydrochlorothiazide (MICROZIDE) 12.5 MG capsule Take 12.5 mg by mouth daily.     [provider]  levETIRAcetam (KEPPRA) 250 MG tablet Take 1 tablet (250 mg total) by mouth 2 (two) times daily. 08/03/19   Quintella Reichert, MD    Allergies    Patient has no known allergies.  Review of Systems   Review of Systems  Cardiovascular: Positive for syncope.  All other systems reviewed and are negative.   Physical Exam Updated Vital Signs BP 118/67   Pulse 76   Temp 98.2 F (36.8 C) (Oral)    Resp (!) 23   Ht 5\' 3"  (1.6 m)   Wt 74.8 kg   SpO2 99%   BMI 29.23 kg/m   Physical Exam Vitals and nursing note reviewed.  Constitutional:      Appearance: She is well-developed.  HENT:     Head: Normocephalic and atraumatic.  Cardiovascular:     Rate and Rhythm: Normal rate and regular rhythm.     Heart sounds: Murmur present.  Pulmonary:     Effort: Pulmonary effort is normal. No respiratory distress.     Breath sounds: Normal breath sounds.  Abdominal:     Palpations: Abdomen is soft.     Tenderness: There is no abdominal tenderness. There is no guarding or rebound.  Musculoskeletal:        General: No tenderness.  Skin:    General: Skin is warm and dry.  Neurological:     Mental Status: She is alert.     Comments: Oriented to person and place, disoriented to time. Five out of five strength in all four extremities  Psychiatric:        Behavior: Behavior normal.     ED Results / Procedures / Treatments   Labs (all labs ordered are listed, but only abnormal results are displayed) Labs Reviewed  COMPREHENSIVE METABOLIC PANEL - Abnormal; Notable for the following components:      Result Value   Glucose, Bld 207 (*)    Creatinine, Ser 1.34 (*)    GFR calc non Af Amer 35 (*)    GFR calc Af Amer 41 (*)    All other components within normal limits  CBC WITH DIFFERENTIAL/PLATELET - Abnormal; Notable for the following components:   RBC 3.80 (*)    Hemoglobin 9.7 (*)    HCT 32.4 (*)    MCH 25.5 (*)    MCHC 29.9 (*)    RDW 19.7 (*)    nRBC 2.7 (*)    Abs Immature Granulocytes 0.15 (*)    All other components within normal limits  MAGNESIUM    EKG EKG Interpretation  Date/Time:  Monday August 03 2019 19:07:37 EST Ventricular Rate:  71 PR Interval:    QRS Duration: 93 QT Interval:  412 QTC Calculation: 448 R Axis:   81 Text Interpretation: Sinus rhythm Borderline right axis deviation Minimal ST depression, inferior leads Confirmed by Quintella Reichert  [phone]) on 08/03/2019 7:18:51 PM   Radiology No results found.  Procedures Procedures (including critical care time)  Medications Ordered in ED Medications  levETIRAcetam (KEPPRA) tablet 500 mg (500 mg Oral Given 08/03/19 1903)    ED Course  I have reviewed the triage vital signs and the nursing notes.  Pertinent labs & imaging  results that were available during my care of the patient were reviewed by me and considered in my medical decision making (see chart for details).    MDM Rules/Calculators/A&P                     Patient with history of dementia coming from facility for syncopal event versus seizure. She is awake and alert on evaluation in the emergency department in no acute distress. Discussed syncopal events with our neurologist, Dr. Leonel Ramsay. Based on history and comorbidities events sounds like a seizure. Given recurrent events for the last few weeks will initiate Keppra, 250 mg BID and she can follow up with her neurologist for recheck.  Presentation is not consistent with sepsis, arrhythmia, SAH.    Final Clinical Impression(s) / ED Diagnoses Final diagnoses:  Seizure-like activity [facility])    Rx / DC Orders ED Discharge Orders         Ordered    levETIRAcetam (KEPPRA) 250 MG tablet  2 times daily     08/03/19 2017           Quintella Reichert, MD 08/03/19 2020

## 2019-08-25 ENCOUNTER — Observation Stay (HOSPITAL_COMMUNITY): Payer: Medicare Other

## 2019-08-25 ENCOUNTER — Inpatient Hospital Stay (HOSPITAL_COMMUNITY)
Admission: EM | Admit: 2019-08-25 | Discharge: 2019-08-28 | DRG: 301 | Disposition: A | Discharge status: Skilled Nursing Facility | Payer: Medicare Other | Attending: Internal Medicine | Admitting: Internal Medicine

## 2019-08-25 ENCOUNTER — Emergency Department (HOSPITAL_COMMUNITY): Payer: Medicare Other

## 2019-08-25 ENCOUNTER — Other Ambulatory Visit: Payer: Self-pay

## 2019-08-25 DIAGNOSIS — R55 Syncope and collapse: Secondary | ICD-10-CM

## 2019-08-25 DIAGNOSIS — I739 Peripheral vascular disease, unspecified: Principal | ICD-10-CM | POA: Diagnosis present

## 2019-08-25 DIAGNOSIS — G309 Alzheimer's disease, unspecified: Secondary | ICD-10-CM | POA: Diagnosis present

## 2019-08-25 DIAGNOSIS — Z20822 Contact with and (suspected) exposure to covid-19: Secondary | ICD-10-CM | POA: Diagnosis present

## 2019-08-25 DIAGNOSIS — Z79899 Other long term (current) drug therapy: Secondary | ICD-10-CM

## 2019-08-25 DIAGNOSIS — R9431 Abnormal electrocardiogram [ECG] [EKG]: Secondary | ICD-10-CM

## 2019-08-25 DIAGNOSIS — Z8249 Family history of ischemic heart disease and other diseases of the circulatory system: Secondary | ICD-10-CM

## 2019-08-25 DIAGNOSIS — F0390 Unspecified dementia without behavioral disturbance: Secondary | ICD-10-CM | POA: Diagnosis present

## 2019-08-25 DIAGNOSIS — D649 Anemia, unspecified: Secondary | ICD-10-CM | POA: Diagnosis present

## 2019-08-25 DIAGNOSIS — I10 Essential (primary) hypertension: Secondary | ICD-10-CM | POA: Diagnosis present

## 2019-08-25 DIAGNOSIS — F0280 Dementia in other diseases classified elsewhere without behavioral disturbance: Secondary | ICD-10-CM | POA: Diagnosis present

## 2019-08-25 DIAGNOSIS — N182 Chronic kidney disease, stage 2 (mild): Secondary | ICD-10-CM | POA: Diagnosis present

## 2019-08-25 DIAGNOSIS — E559 Vitamin D deficiency, unspecified: Secondary | ICD-10-CM | POA: Diagnosis present

## 2019-08-25 DIAGNOSIS — G40909 Epilepsy, unspecified, not intractable, without status epilepticus: Secondary | ICD-10-CM | POA: Diagnosis present

## 2019-08-25 DIAGNOSIS — I129 Hypertensive chronic kidney disease with stage 1 through stage 4 chronic kidney disease, or unspecified chronic kidney disease: Secondary | ICD-10-CM | POA: Diagnosis present

## 2019-08-25 LAB — CBC WITH DIFFERENTIAL/PLATELET
Abs Immature Granulocytes: 0.06 10*3/uL (ref 0.00–0.07)
Basophils Absolute: 0 10*3/uL (ref 0.0–0.1)
Basophils Relative: 1 %
Eosinophils Absolute: 0.1 10*3/uL (ref 0.0–0.5)
Eosinophils Relative: 2 %
HCT: 33.3 % — ABNORMAL LOW (ref 36.0–46.0)
Hemoglobin: 9.7 g/dL — ABNORMAL LOW (ref 12.0–15.0)
Immature Granulocytes: 1 %
Lymphocytes Relative: 24 %
Lymphs Abs: 1.5 10*3/uL (ref 0.7–4.0)
MCH: 25 pg — ABNORMAL LOW (ref 26.0–34.0)
MCHC: 29.1 g/dL — ABNORMAL LOW (ref 30.0–36.0)
MCV: 85.8 fL (ref 80.0–100.0)
Monocytes Absolute: 0.6 10*3/uL (ref 0.1–1.0)
Monocytes Relative: 10 %
Neutro Abs: 3.9 10*3/uL (ref 1.7–7.7)
Neutrophils Relative %: 62 %
Platelets: 221 10*3/uL (ref 150–400)
RBC: 3.88 MIL/uL (ref 3.87–5.11)
RDW: 19.4 % — ABNORMAL HIGH (ref 11.5–15.5)
WBC: 6.3 10*3/uL (ref 4.0–10.5)
nRBC: 1.4 % — ABNORMAL HIGH (ref 0.0–0.2)

## 2019-08-25 LAB — COMPREHENSIVE METABOLIC PANEL
ALT: 14 U/L (ref 0–44)
AST: 26 U/L (ref 15–41)
Albumin: 4.2 g/dL (ref 3.5–5.0)
Alkaline Phosphatase: 109 U/L (ref 38–126)
Anion gap: 12 (ref 5–15)
BUN: 22 mg/dL (ref 8–23)
CO2: 28 mmol/L (ref 22–32)
Calcium: 9.7 mg/dL (ref 8.9–10.3)
Chloride: 104 mmol/L (ref 98–111)
Creatinine, Ser: 1.29 mg/dL — ABNORMAL HIGH (ref 0.44–1.00)
GFR calc Af Amer: 43 mL/min — ABNORMAL LOW (ref >60)
GFR calc non Af Amer: 37 mL/min — ABNORMAL LOW (ref >60)
Glucose, Bld: 174 mg/dL — ABNORMAL HIGH (ref 70–99)
Potassium: 3.5 mmol/L (ref 3.5–5.1)
Sodium: 144 mmol/L (ref 135–145)
Total Bilirubin: 0.5 mg/dL (ref 0.3–1.2)
Total Protein: 7.2 g/dL (ref 6.5–8.1)

## 2019-08-25 LAB — TROPONIN I (HIGH SENSITIVITY)
Troponin I (High Sensitivity): 5 ng/L (ref <18)
Troponin I (High Sensitivity): 6 ng/L (ref <18)

## 2019-08-25 LAB — MAGNESIUM: Magnesium: 2.1 mg/dL (ref 1.7–2.4)

## 2019-08-25 MED ORDER — ASPIRIN 81 MG PO CHEW
81.0000 mg | CHEWABLE_TABLET | Freq: Every day | ORAL | Status: DC
  Administered 2019-08-26 – 2019-08-28 (×3): 81 mg via ORAL
  Filled 2019-08-25 (×3): qty 1

## 2019-08-25 MED ORDER — ENSURE ENLIVE PO LIQD
237.0000 mL | Freq: Three times a day (TID) | ORAL | Status: DC
  Administered 2019-08-26 – 2019-08-28 (×4): 237 mL via ORAL

## 2019-08-25 MED ORDER — ACETAMINOPHEN 650 MG RE SUPP: 650.0000 mg | Freq: Four times a day (QID) | RECTAL | Status: DC | PRN

## 2019-08-25 MED ORDER — VITAMIN D 25 MCG (1000 UNIT) PO TABS
1000.0000 [IU] | ORAL_TABLET | Freq: Every day | ORAL | Status: DC
  Administered 2019-08-26 – 2019-08-28 (×3): 1000 [IU] via ORAL
  Filled 2019-08-25 (×3): qty 1

## 2019-08-25 MED ORDER — MEMANTINE HCL 10 MG PO TABS: 10.0000 mg | ORAL_TABLET | Freq: Two times a day (BID) | ORAL | Status: DC

## 2019-08-25 MED ORDER — LEVETIRACETAM 250 MG PO TABS
250.0000 mg | ORAL_TABLET | Freq: Two times a day (BID) | ORAL | Status: DC
  Administered 2019-08-25 – 2019-08-28 (×5): 250 mg via ORAL
  Filled 2019-08-25 (×8): qty 1

## 2019-08-25 MED ORDER — POTASSIUM CHLORIDE CRYS ER 20 MEQ PO TBCR
40.0000 meq | EXTENDED_RELEASE_TABLET | Freq: Once | ORAL | Status: AC
  Administered 2019-08-25: 40 meq via ORAL
  Filled 2019-08-25: qty 2

## 2019-08-25 MED ORDER — SODIUM CHLORIDE 0.9% FLUSH
3.0000 mL | Freq: Two times a day (BID) | INTRAVENOUS | Status: DC
  Administered 2019-08-27 – 2019-08-28 (×3): 3 mL via INTRAVENOUS

## 2019-08-25 MED ORDER — ENOXAPARIN SODIUM 40 MG/0.4ML ~~LOC~~ SOLN
40.0000 mg | SUBCUTANEOUS | Status: DC
  Administered 2019-08-25 – 2019-08-27 (×3): 40 mg via SUBCUTANEOUS
  Filled 2019-08-25 (×3): qty 0.4

## 2019-08-25 MED ORDER — ACETAMINOPHEN 325 MG PO TABS: 650.0000 mg | ORAL_TABLET | Freq: Four times a day (QID) | ORAL | Status: DC | PRN

## 2019-08-25 MED ORDER — DONEPEZIL HCL 5 MG PO TABS: 10.0000 mg | ORAL_TABLET | Freq: Every day | ORAL | Status: DC

## 2019-08-25 NOTE — ED Triage Notes (Signed)
Pt arrived via [facility] from [facility] memory care unit CC syncope episode assisted to ground without injury and episode of emesis. Facility reports several similar events over last few months. Per EMS A/OX2.    Hx dementia, anemia HTN syncope   20G left forearm VSS Afebrile

## 2019-08-25 NOTE — ED Provider Notes (Signed)
[facility] DEPT Provider Note   CSN: [id number] Arrival date & time: 08/25/19  1829     History Chief Complaint  Patient presents with  . Loss of Consciousness    Shawna Lin is a 86 y.o. female.  Level 5 caveat for dementia.  Patient brought in by EMS from her living facility after syncopal episode.  She was apparently assisted to the ground and lost consciousness.  There is one episode of emesis.  She did not fall or hit her head.  She is unable to give any history.  She is oriented to person and place.  She believes that she is here because she "got sick".  She denies any chest pain, shortness of breath, abdominal pain, nausea or vomiting.  Discussed with patient's niece and power of attorney [name].  She states patient has had recurrent episodes of losing consciousness within the past month has been seen at this hospital multiple times.  She was treated for possible seizures with Keppra. Family was not aware of any heart murmur. Family was told that patient was breathing for several minutes and did receive chest compressions at facility.    The history is provided by the patient and the EMS personnel. The history is limited by the condition of the patient.  Loss of Consciousness      Past Medical History:  Diagnosis Date  . Hypertension   . Memory loss   . Vitamin D deficiency     Patient Active Problem List   Diagnosis Date Noted  . Pain due to onychomycosis of toenails of both feet 01/07/2019  . Syncope 12/21/2015  . Dementia ([place]) 12/21/2015  . No blood products 12/21/2015  . 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 Memory loss 09/02/2015  . HTN (hypertension) 09/02/2015    Past Surgical History:  Procedure Laterality Date  . UTERINE FIBROID SURGERY       OB History   No obstetric history on file.     Family History  Problem Relation Age of Onset  . Heart attack Father   . Hypertension Mother   . Colon cancer Brother   .  Hypertension Brother   . Diabetes Sister   . Kidney failure Sister   . Hypertension Sister   . Dementia Neg Hx     Social History   Tobacco Use  . Smoking status: Never Smoker  . Smokeless tobacco: Never Used  Substance Use Topics  . Alcohol use: No    Alcohol/week: 0.0 standard drinks  . Drug use: No    Home Medications Prior to Admission medications   Medication Sig Start Date End Date Taking? Authorizing Provider  acetaminophen (TYLENOL) 325 MG tablet Take 650 mg by mouth every 12 (twelve) hours as needed (knee pain).     [provider]  amLODipine (NORVASC) 2.5 MG tablet Take 2.5 mg by mouth daily. 12/12/17   [provider]  Aspirin 81 MG EC tablet Take 81 mg by mouth daily.    [provider]  cholecalciferol (VITAMIN D3) 25 MCG (1000 UNIT) tablet Take 1,000 Units by mouth daily.    [provider]  donepezil (ARICEPT) 10 MG tablet TAKE ONE TABLET BY MOUTH AT BEDTIME Patient taking differently: Take 10 mg by mouth at bedtime.  12/18/16   Melvenia Beam, MD  Ensure (ENSURE) Take 237 mLs by mouth 3 (three) times daily.    [provider]  hydrochlorothiazide (MICROZIDE) 12.5 MG capsule Take 12.5 mg by mouth daily.  [provider]  levETIRAcetam (KEPPRA) 250 MG tablet Take 1 tablet (250 mg total) by mouth 2 (two) times daily. 08/03/19   Quintella Reichert, MD  memantine (NAMENDA) 10 MG tablet Take 1 tablet (10 mg total) by mouth 2 (two) times daily. 06/26/18   Melvenia Beam, MD  OLANZapine (ZYPREXA) 2.5 MG tablet Take 2.5 mg by mouth at bedtime.    [provider]    Allergies    Patient has no known allergies.  Review of Systems   Review of Systems  Unable to perform ROS: Dementia  Cardiovascular: Positive for syncope.    Physical Exam Updated Vital Signs BP 140/82 (BP Location: Right Arm)   Pulse 68   Resp 16   SpO2 100%   Physical Exam Vitals and nursing note reviewed.  Constitutional:       General: She is not in acute distress.    Appearance: She is well-developed.  HENT:     Head: Normocephalic and atraumatic.     Mouth/Throat:     Pharynx: No oropharyngeal exudate.  Eyes:     Conjunctiva/sclera: Conjunctivae normal.     Pupils: Pupils are equal, round, and reactive to light.  Neck:     Comments: No meningismus. Cardiovascular:     Rate and Rhythm: Normal rate and regular rhythm.     Heart sounds: Murmur present.     Comments: Systolic murmur present Pulmonary:     Effort: Pulmonary effort is normal. No respiratory distress.     Breath sounds: Normal breath sounds.  Abdominal:     Palpations: Abdomen is soft.     Tenderness: There is no abdominal tenderness. There is no guarding or rebound.  Musculoskeletal:        General: No tenderness. Normal range of motion.     Cervical back: Normal range of motion and neck supple.  Skin:    General: Skin is warm.  Neurological:     Mental Status: She is alert.     Cranial Nerves: No cranial nerve deficit.     Motor: No abnormal muscle tone.     Coordination: Coordination normal.     Comments: Oriented to person and place.  Cranial nerves II to XII intact, 5/5 strength throughout  Psychiatric:        Behavior: Behavior normal.     ED Results / Procedures / Treatments   Labs (all labs ordered are listed, but only abnormal results are displayed) Labs Reviewed  CBC WITH DIFFERENTIAL/PLATELET - Abnormal; Notable for the following components:      Result Value   Hemoglobin 9.7 (*)    HCT 33.3 (*)    MCH 25.0 (*)    MCHC 29.1 (*)    RDW 19.4 (*)    nRBC 1.4 (*)    All other components within normal limits  COMPREHENSIVE METABOLIC PANEL - Abnormal; Notable for the following components:   Glucose, Bld 174 (*)    Creatinine, Ser 1.29 (*)    GFR calc non Af Amer 37 (*)    GFR calc Af Amer 43 (*)    All other components within normal limits  RESPIRATORY PANEL BY RT PCR (FLU A&B, COVID)  TROPONIN I (HIGH SENSITIVITY)    TROPONIN I (HIGH SENSITIVITY)    EKG EKG Interpretation  Date/Time:  Tuesday August 25 2019 18:57:28 EST Ventricular Rate:  66 PR Interval:    QRS Duration: 86 QT Interval:  508 QTC Calculation: 533 R Axis:   72 Text Interpretation:  Sinus rhythm Nonspecific T abnrm, anterolateral leads Prolonged QT interval Nonspecific T wave abnormality Confirmed by Ezequiel Essex [phone]) on 08/25/2019 7:01:57 PM   Radiology CT Head Wo Contrast  Result Date: 08/25/2019 CLINICAL DATA:  86 year old female with encephalopathy. EXAM: CT HEAD WITHOUT CONTRAST TECHNIQUE: Contiguous axial images were obtained from the base of the skull through the vertex without intravenous contrast. COMPARISON:  Head CT dated 07/26/2019. FINDINGS: Brain: There is mild age-related atrophy and chronic microvascular ischemic changes. There is no acute intracranial hemorrhage. No mass effect or midline shift. No extra-axial fluid collection. Vascular: No hyperdense vessel or unexpected calcification. Skull: No acute calvarial pathology. There is mottled appearance of the skull with diffuse tiny lucencies with salt and pepper appearance of the calvarium which can be seen in the setting of hyperthyroidism. Clinical correlation is recommended. Sinuses/Orbits: Left maxillary sinus retention cyst or polyp. No air-fluid level. The mastoid air cells are clear. Other: None IMPRESSION: 1. No acute intracranial pathology. 2. Mild age-related atrophy and chronic microvascular ischemic changes. Electronically Signed   By: Anner Crete M.D.   On: 08/25/2019 19:25    Procedures Procedures (including critical care time)  Medications Ordered in ED Medications - No data to display  ED Course  I have reviewed the triage vital signs and the nursing notes.  Pertinent labs & imaging results that were available during my care of the patient were reviewed by me and considered in my medical decision making (see chart for details).    MDM  Rules/Calculators/A&P                      Patient from facility after episode of loss of consciousness.  Details are not available.  Call to facility was not returned.  EKG shows sinus rhythm without Brugada but does have prolonged QT at 533.  Work-up is reassuring.  CT head is stable.  EKG is concerning for prolonged QT.  Patient also does have a systolic murmur on exam.  Concern for possible cardiac etiology of her syncope.  Discussed with her power of attorney who agrees with overnight observation  Concern for cardiac etiology of syncope d/w POA.  Dr. Marlowe Sax to admit.  Final Clinical Impression(s) / ED Diagnoses Final diagnoses:  Recurrent syncope    Rx / DC Orders ED Discharge Orders    None       Camila Maita, Annie Main, MD 08/26/19 0025

## 2019-08-25 NOTE — H&P (Signed)
History and Physical    Shawna Lin [id number] DOB: Dec 20, 1928 DOA: 08/25/2019  PCP: London Pepper, MD Patient coming from: [facility]  Chief Complaint: Syncope  HPI: Shawna Lin is a 86 y.o. female with medical history significant of dementia, hypertension, vitamin D deficiency being brought to the hospital by EMS from the memory care unit at [facility] after a syncopal episode.  She was assisted to the ground and lost consciousness.  No injury reported.  No reliable history could be obtained from the patient given her baseline dementia.  States she passed out but does not know what happened.  She has no other complaints.  Denies lightheadedness/dizziness, chest pain, palpitations, shortness of breath, or cough.  No additional history could be obtained from her.  Per ED provider's conversation with the patient's family: "Discussed with patient's niece and power of attorney [name].  She states patient has had recurrent episodes of losing consciousness within the past month has been seen at this hospital multiple times.  She was treated for possible seizures with Keppra. Family was not aware of any heart murmur."  ED Course: Afebrile and hemodynamically stable.  EKG showing sinus rhythm without Brugada but does show prolonged QTC of 533.  Head CT negative for acute intracranial abnormality.  Labs showing no leukocytosis.  Hemoglobin 9.7, stable since labs done a month ago.  Blood glucose 174.  Initial high-sensitivity troponin negative, repeat pending.  Respiratory panel pending.  Review of Systems:  All systems reviewed and apart from history of presenting illness, are negative.  Past Medical History:  Diagnosis Date  . Hypertension   . Memory loss   . Vitamin D deficiency     Past Surgical History:  Procedure Laterality Date  . UTERINE FIBROID SURGERY       reports that she has never smoked. She has never used smokeless tobacco. She reports that she does not  drink alcohol or use drugs.  No Known Allergies  Family History  Problem Relation Age of Onset  . Heart attack Father   . Hypertension Mother   . Colon cancer Brother   . Hypertension Brother   . Diabetes Sister   . Kidney failure Sister   . Hypertension Sister   . Dementia Neg Hx     Prior to Admission medications   Medication Sig Start Date End Date Taking? Authorizing Provider  acetaminophen (TYLENOL) 325 MG tablet Take 650 mg by mouth every 12 (twelve) hours as needed (knee pain).    Yes [provider]  amLODipine (NORVASC) 2.5 MG tablet Take 2.5 mg by mouth daily. 12/12/17  Yes [provider]  Aspirin 81 MG EC tablet Take 81 mg by mouth daily.   Yes [provider]  cholecalciferol (VITAMIN D3) 25 MCG (1000 UNIT) tablet Take 1,000 Units by mouth daily.   Yes [provider]  donepezil (ARICEPT) 10 MG tablet TAKE ONE TABLET BY MOUTH AT BEDTIME Patient taking differently: Take 10 mg by mouth at bedtime.  12/18/16  Yes Melvenia Beam, MD  Ensure (ENSURE) Take 237 mLs by mouth 3 (three) times daily.   Yes [provider]  hydrochlorothiazide (MICROZIDE) 12.5 MG capsule Take 12.5 mg by mouth daily.    Yes [provider]  levETIRAcetam (KEPPRA) 250 MG tablet Take 1 tablet (250 mg total) by mouth 2 (two) times daily. 08/03/19  Yes Quintella Reichert, MD  memantine (NAMENDA) 10 MG tablet Take 1 tablet (10 mg total) by mouth 2 (two) times  daily. 06/26/18  Yes Melvenia Beam, MD  OLANZapine (ZYPREXA) 2.5 MG tablet Take 2.5 mg by mouth at bedtime.   Yes [provider]    Physical Exam: Vitals:   08/25/19 1843 08/25/19 1849 08/25/19 1931  BP:  140/82 (!) 148/78  Pulse:  68 72  Resp:  16 15  Temp:  98.6 F (37 C)   TempSrc:  Oral   SpO2: 99% 100% 100%    Physical Exam  Constitutional: She is oriented to person, place, and time. No distress.  HENT:  Head: Normocephalic.  Eyes: Right eye exhibits no discharge. Left  eye exhibits no discharge.  Cardiovascular: Normal rate, regular rhythm and intact distal pulses.  Murmur heard. Systolic murmur present  Pulmonary/Chest: Effort normal and breath sounds normal. No respiratory distress. She has no wheezes. She has no rales.  Abdominal: Soft. Bowel sounds are normal. She exhibits no distension. There is no abdominal tenderness. There is no guarding.  Musculoskeletal:        General: No edema.     Cervical back: Neck supple.  Neurological: She is alert and oriented to person, place, and time. No cranial nerve deficit.  No focal motor or sensory deficit.  Skin: Skin is warm and dry. She is not diaphoretic.     Labs on Admission: I have personally reviewed following labs and imaging studies  CBC: Recent Labs  Lab 08/25/19 1857  WBC 6.3  NEUTROABS 3.9  HGB 9.7*  HCT 33.3*  MCV 85.8  PLT [id number]   Basic Metabolic Panel: Recent Labs  Lab 08/25/19 1857  NA 144  K 3.5  CL 104  CO2 28  GLUCOSE 174*  BUN 22  CREATININE 1.29*  CALCIUM 9.7   GFR: CrCl cannot be calculated (Unknown ideal weight.). Liver Function Tests: Recent Labs  Lab 08/25/19 1857  AST 26  ALT 14  ALKPHOS 109  BILITOT 0.5  PROT 7.2  ALBUMIN 4.2   No results for input(s): LIPASE, AMYLASE in the last 168 hours. No results for input(s): AMMONIA in the last 168 hours. Coagulation Profile: No results for input(s): INR, PROTIME in the last 168 hours. Cardiac Enzymes: No results for input(s): CKTOTAL, CKMB, CKMBINDEX, TROPONINI in the last 168 hours. BNP (last 3 results) No results for input(s): PROBNP in the last 8760 hours. HbA1C: No results for input(s): HGBA1C in the last 72 hours. CBG: No results for input(s): GLUCAP in the last 168 hours. Lipid Profile: No results for input(s): CHOL, HDL, LDLCALC, TRIG, CHOLHDL, LDLDIRECT in the last 72 hours. Thyroid Function Tests: No results for input(s): TSH, T4TOTAL, FREET4, T3FREE, THYROIDAB in the last 72 hours. Anemia  Panel: No results for input(s): VITAMINB12, FOLATE, FERRITIN, TIBC, IRON, RETICCTPCT in the last 72 hours. Urine analysis:    Component Value Date/Time   COLORURINE YELLOW 07/26/2019 1842   APPEARANCEUR HAZY (A) 07/26/2019 1842   LABSPEC 1.026 07/26/2019 1842   PHURINE 5.0 07/26/2019 1842   GLUCOSEU NEGATIVE 07/26/2019 1842   HGBUR NEGATIVE 07/26/2019 1842   BILIRUBINUR NEGATIVE 07/26/2019 1842   KETONESUR NEGATIVE 07/26/2019 1842   PROTEINUR 100 (A) 07/26/2019 1842   NITRITE NEGATIVE 07/26/2019 1842   LEUKOCYTESUR NEGATIVE 07/26/2019 1842    Radiological Exams on Admission: CT Head Wo Contrast  Result Date: 08/25/2019 CLINICAL DATA:  86 year old female with encephalopathy. EXAM: CT HEAD WITHOUT CONTRAST TECHNIQUE: Contiguous axial images were obtained from the base of the skull through the vertex without intravenous contrast. COMPARISON:  Head CT dated 07/26/2019. FINDINGS:  Brain: There is mild age-related atrophy and chronic microvascular ischemic changes. There is no acute intracranial hemorrhage. No mass effect or midline shift. No extra-axial fluid collection. Vascular: No hyperdense vessel or unexpected calcification. Skull: No acute calvarial pathology. There is mottled appearance of the skull with diffuse tiny lucencies with salt and pepper appearance of the calvarium which can be seen in the setting of hyperthyroidism. Clinical correlation is recommended. Sinuses/Orbits: Left maxillary sinus retention cyst or polyp. No air-fluid level. The mastoid air cells are clear. Other: None IMPRESSION: 1. No acute intracranial pathology. 2. Mild age-related atrophy and chronic microvascular ischemic changes. Electronically Signed   By: Anner Crete M.D.   On: 08/25/2019 19:25    EKG: Independently reviewed.  Sinus rhythm, nonspecific T wave abnormality.  QTc 533.  QT interval increased since prior tracing.  Assessment/Plan Principal Problem:   Recurrent syncope Active Problems:   HTN  (hypertension)   Dementia ([facility])   QT prolongation   Seizure disorder ([facility])   Recurrent syncope Differentials include cardiac arrhythmia, structural heart disease, orthostatic, vasovagal, and seizure activity.  EKG showing sinus rhythm without Brugada but does show prolonged QTC of 533.  In addition, systolic murmur appreciated on exam.  Last echo from June 2017 with mild aortic stenosis.  Initial high-sensitivity troponin negative.  Head CT negative for acute intracranial abnormality.  Not hypoglycemic.  Hemoglobin stable compared to labs done a month ago.  No fever or leukocytosis to suggest underlying infection. -Cardiac monitoring -Echocardiogram -Carotid Dopplers -EEG -Orthostatics -Repeat high-sensitivity troponin pending -Fall precautions  QT prolongation on EKG QTC 533.  Does take Zyprexa, Aricept, and Namenda which could possibly be contributing. -Cardiac monitoring -Keep potassium above 4 and magnesium above 2 -Avoid QT prolonging drugs.  Hold olanzapine. -Repeat EKG in a.m.  History of seizure disorder -Continue home Keppra -Check Keppra level -EEG as above  Alzheimer's dementia -Hold Aricept and Namenda at this time given QT prolongation  Hypertension -Hold antihypertensives at this time.  Orthostatics pending.  DVT prophylaxis: Lovenox Code Status: Full code Family Communication: No family available at this time. Disposition Plan: Anticipate discharge after clinical improvement. Consults called: None Admission status: It is my clinical opinion that referral for OBSERVATION is reasonable and necessary in this patient based on the above information provided. The aforementioned taken together are felt to place the patient at high risk for further clinical deterioration. However it is anticipated that the patient may be medically stable for discharge from the hospital within 24 to 48 hours.  The medical decision making on this patient was of high complexity and the  patient is at high risk for clinical deterioration, therefore this is a level 3 visit.  Shela Leff MD [facility]  If 7PM-7AM, please contact night-coverage [email or web address] Password [facility]  08/25/2019, 9:21 PM

## 2019-08-25 NOTE — ED Notes (Signed)
Call received pt nephew Enzo Bi [phone] requesting rtn call re: pt update/status. RN advised. [employer]

## 2019-08-26 ENCOUNTER — Ambulatory Visit (HOSPITAL_COMMUNITY): Payer: Medicare Other

## 2019-08-26 ENCOUNTER — Encounter (HOSPITAL_COMMUNITY): Payer: Self-pay | Admitting: Internal Medicine

## 2019-08-26 ENCOUNTER — Inpatient Hospital Stay (HOSPITAL_COMMUNITY)
Admit: 2019-08-26 | Discharge: 2019-08-26 | Disposition: A | Discharge status: Home / Self Care | Payer: Medicare Other | Attending: Internal Medicine | Admitting: Internal Medicine

## 2019-08-26 ENCOUNTER — Observation Stay (HOSPITAL_BASED_OUTPATIENT_CLINIC_OR_DEPARTMENT_OTHER): Payer: Medicare Other

## 2019-08-26 DIAGNOSIS — I10 Essential (primary) hypertension: Secondary | ICD-10-CM | POA: Diagnosis not present

## 2019-08-26 DIAGNOSIS — Z8249 Family history of ischemic heart disease and other diseases of the circulatory system: Secondary | ICD-10-CM | POA: Diagnosis not present

## 2019-08-26 DIAGNOSIS — R55 Syncope and collapse: Secondary | ICD-10-CM | POA: Diagnosis present

## 2019-08-26 DIAGNOSIS — F0280 Dementia in other diseases classified elsewhere without behavioral disturbance: Secondary | ICD-10-CM | POA: Diagnosis present

## 2019-08-26 DIAGNOSIS — I739 Peripheral vascular disease, unspecified: Secondary | ICD-10-CM | POA: Diagnosis present

## 2019-08-26 DIAGNOSIS — Z79899 Other long term (current) drug therapy: Secondary | ICD-10-CM | POA: Diagnosis not present

## 2019-08-26 DIAGNOSIS — Z20822 Contact with and (suspected) exposure to covid-19: Secondary | ICD-10-CM | POA: Diagnosis present

## 2019-08-26 DIAGNOSIS — I129 Hypertensive chronic kidney disease with stage 1 through stage 4 chronic kidney disease, or unspecified chronic kidney disease: Secondary | ICD-10-CM | POA: Diagnosis present

## 2019-08-26 DIAGNOSIS — G40909 Epilepsy, unspecified, not intractable, without status epilepticus: Secondary | ICD-10-CM | POA: Diagnosis present

## 2019-08-26 DIAGNOSIS — R9431 Abnormal electrocardiogram [ECG] [EKG]: Secondary | ICD-10-CM | POA: Diagnosis not present

## 2019-08-26 DIAGNOSIS — D649 Anemia, unspecified: Secondary | ICD-10-CM | POA: Diagnosis present

## 2019-08-26 DIAGNOSIS — F0390 Unspecified dementia without behavioral disturbance: Secondary | ICD-10-CM

## 2019-08-26 DIAGNOSIS — E559 Vitamin D deficiency, unspecified: Secondary | ICD-10-CM | POA: Diagnosis present

## 2019-08-26 DIAGNOSIS — G309 Alzheimer's disease, unspecified: Secondary | ICD-10-CM | POA: Diagnosis present

## 2019-08-26 DIAGNOSIS — N182 Chronic kidney disease, stage 2 (mild): Secondary | ICD-10-CM | POA: Diagnosis present

## 2019-08-26 LAB — BASIC METABOLIC PANEL
Anion gap: 9 (ref 5–15)
BUN: 20 mg/dL (ref 8–23)
CO2: 27 mmol/L (ref 22–32)
Calcium: 9.7 mg/dL (ref 8.9–10.3)
Chloride: 107 mmol/L (ref 98–111)
Creatinine, Ser: 1.04 mg/dL — ABNORMAL HIGH (ref 0.44–1.00)
GFR calc Af Amer: 56 mL/min — ABNORMAL LOW (ref >60)
GFR calc non Af Amer: 48 mL/min — ABNORMAL LOW (ref >60)
Glucose, Bld: 82 mg/dL (ref 70–99)
Potassium: 3.5 mmol/L (ref 3.5–5.1)
Sodium: 143 mmol/L (ref 135–145)

## 2019-08-26 LAB — RESPIRATORY PANEL BY RT PCR (FLU A&B, COVID)
Influenza A by PCR: NEGATIVE
Influenza B by PCR: NEGATIVE
SARS Coronavirus 2 by RT PCR: NEGATIVE

## 2019-08-26 LAB — CBC
HCT: 31.7 % — ABNORMAL LOW (ref 36.0–46.0)
Hemoglobin: 9.5 g/dL — ABNORMAL LOW (ref 12.0–15.0)
MCH: 25.5 pg — ABNORMAL LOW (ref 26.0–34.0)
MCHC: 30 g/dL (ref 30.0–36.0)
MCV: 85.2 fL (ref 80.0–100.0)
Platelets: 183 10*3/uL (ref 150–400)
RBC: 3.72 MIL/uL — ABNORMAL LOW (ref 3.87–5.11)
RDW: 19.4 % — ABNORMAL HIGH (ref 11.5–15.5)
WBC: 6.1 10*3/uL (ref 4.0–10.5)
nRBC: 1.6 % — ABNORMAL HIGH (ref 0.0–0.2)

## 2019-08-26 LAB — GLUCOSE, CAPILLARY: Glucose-Capillary: 80 mg/dL (ref 70–99)

## 2019-08-26 LAB — ECHOCARDIOGRAM COMPLETE: Weight: 2640 oz

## 2019-08-26 LAB — POTASSIUM: Potassium: 3.5 mmol/L (ref 3.5–5.1)

## 2019-08-26 MED ORDER — POTASSIUM CHLORIDE 10 MEQ/100ML IV SOLN
10.0000 meq | INTRAVENOUS | Status: AC
  Administered 2019-08-26 (×3): 10 meq via INTRAVENOUS
  Filled 2019-08-26 (×4): qty 100

## 2019-08-26 NOTE — Procedures (Addendum)
Patient Name: Shawna Lin  MRN: [id number]  Epilepsy Attending: Lora Havens  Referring Physician/Provider: Dr. Shela Leff Date: 08/26/2019 Duration: 22.54 minutes  Patient history: 86 year old female who presented with recurrent syncope.  EEG to evaluate for seizures.  Level of alertness: Awake  AEDs during EEG study: Keppra   Technical aspects: This EEG study was done with scalp electrodes positioned according to the 10-20 International system of electrode placement. Electrical activity was acquired at a sampling rate of 500Hz  and reviewed with a high frequency filter of 70Hz  and a low frequency filter of 1Hz . EEG data were recorded continuously and digitally stored.   Description: During awake state, no clear posterior dominant rhythm was seen.  EEG showed 15 to 18 Hz patient beta activity with irregular morphology distributed symmetrically and diffusely.  Physiologic photic driving was seen during photic stimulation.  Hyperventilation was not performed.  Abnormality -Beta activity, generalized  IMPRESSION: This study is within normal limits. The beta activity seen in the background is a benign EEG pattern which can be seen in patients with benzodiazepine use. No seizures or epileptiform discharges were seen throughout the recording.  Adair Lemar Barbra Sarks

## 2019-08-26 NOTE — Progress Notes (Signed)
PT Cancellation Note  Patient Details Name: Shawna Lin MRN: [id number] DOB: 1928-12-17   Cancelled Treatment:    Reason Eval/Treat Not Completed: Patient at procedure or test/unavailable(Pt getting ECG performed and EEG is on their way to see pt following completion of ECG study. Will follow up at later date/time as schedule allows.)   Verner Mould, DPT Physical Therapist with [facility] [phone]  08/26/2019 1:31 PM

## 2019-08-26 NOTE — Progress Notes (Signed)
EEG complete - results pending 

## 2019-08-26 NOTE — Progress Notes (Signed)
Echocardiogram 2D Echocardiogram has been performed.  Oneal Deputy Shilo Pauwels 08/26/2019, 9:45 AM

## 2019-08-26 NOTE — Progress Notes (Signed)
Carotid artery duplex has been completed. Preliminary results can be found in CV Proc through chart review.   08/26/19 11:16 AM Shawna Lin RVT

## 2019-08-26 NOTE — Progress Notes (Signed)
PROGRESS NOTE    Shawna Lin  ZOX:[id number] DOB: 06-Jan-1929 DOA: 08/25/2019 PCP: London Pepper, MD   Brief Narrative:  86 year old lady with prior history of dementia, hypertension, vitamin D deficiency comes in to ED for recurrent syncope, always happens after or during her meals.  This is her third admission to the hospital. EKG on admission shows prolonged QTC of 533. CT head without contrast neg for acute stroke.  EEG negative for epileptiform activity.  Echocardiogram shows LVEF of 65% to 70% . Left ventricular diastolic  parameters are consistent with Grade I diastolic dysfunction (impaired  relaxation). Mild to moderate aortic valve stenosis.  Aortic valve area, by VTI measures 1.44 cm. Aortic valve mean gradient  measures 10.0 mmHg. Aortic valve Vmax measures 2.35 m/s.   Assessment & Plan:   Principal Problem:   Recurrent syncope Active Problems:   HTN (hypertension)   Dementia (HCC)   QT prolongation   Seizure disorder (HCC)   Syncope   Recurrent syncope ?  Vasovagal syncope Seizures have been ruled out. Echocardiogram ruled out structural heart disease. Overnight telemetry does not show any cardiac ARRHYTHMIA Repeat EKG shows QTC has improved to 437. CT of the head is negative for acute intracranial abnormality. No signs of infection so far. Carotid duplex is pending. Orthostatic vital signs are pending. PT evaluation and fall precautions.     History of seizure disorder As per the patient's guardian she was recently started on Keppra at [place].    Alzheimer's dementia Meds on hold due to QTC prolongation.   Essential hypertension Blood pressure parameters are well controlled at this time.  Continue to hold antihypertensives.   DVT prophylaxis:Lovenox Code Status: Full code Family Communication: discussed with guardian over the phone.  Disposition Plan:  . Patient came from:ALF.             [place] Kitchen Anticipated d/c place: Pending.  . Barriers to d/c OR  conditions which need to be met to effect a safe d/c: Pending therapy evals.   Consultants:   None.   Procedures:  ECHO EEG  Antimicrobials: none  Subjective: No new complaints.   Objective: Vitals:   08/26/19 0021 08/26/19 0622 08/26/19 0803 08/26/19 1151  BP: 98/71 133/60 (!) 155/63 (!) 116/55  Pulse: 66 61 65 75  Resp: 16 15 18 18   Temp: 98.2 F (36.8 C) 98.6 F (37 C) 98.5 F (36.9 C) 98.8 F (37.1 C)  TempSrc: Oral Oral Oral Oral  SpO2: 98% 98% 97% 99%  Weight: 74.8 kg       Intake/Output Summary (Last 24 hours) at 08/26/2019 1553 Last data filed at 08/26/2019 1254 Gross per 24 hour  Intake 360 ml  Output --  Net 360 ml   Filed Weights   08/26/19 0021  Weight: 74.8 kg    Examination:  General exam: Appears calm and comfortable  Respiratory system: Clear to auscultation. Respiratory effort normal. Cardiovascular system: S1 & S2 heard, RRR. No JVD,  No pedal edema. Gastrointestinal system: Abdomen is nondistended, soft and nontender.  Normal bowel sounds heard. Central nervous system: Alert and oriented. No focal neurological deficits. Extremities: Symmetric 5 x 5 power. Skin: No rashes, lesions or ulcers Psychiatry: Mood & affect appropriate.     Data Reviewed: I have personally reviewed following labs and imaging studies  CBC: Recent Labs  Lab 08/25/19 1857 08/26/19 0457  WBC 6.3 6.1  NEUTROABS 3.9  --   HGB 9.7* 9.5*  HCT 33.3* 31.7*  MCV 85.8 85.2  PLT 221 700   Basic Metabolic Panel: Recent Labs  Lab 08/25/19 1857 08/25/19 2130 08/26/19 0457  NA 144  --  143  K 3.5  --  3.5  3.5  CL 104  --  107  CO2 28  --  27  GLUCOSE 174*  --  82  BUN 22  --  20  CREATININE 1.29*  --  1.04*  CALCIUM 9.7  --  9.7  MG  --  2.1  --    GFR: Estimated Creatinine Clearance: 36.2 mL/min (A) (by C-G formula based on SCr of 1.04 mg/dL (H)). Liver Function Tests: Recent Labs  Lab 08/25/19 1857  AST 26  ALT 14  ALKPHOS 109  BILITOT 0.5   PROT 7.2  ALBUMIN 4.2   No results for input(s): LIPASE, AMYLASE in the last 168 hours. No results for input(s): AMMONIA in the last 168 hours. Coagulation Profile: No results for input(s): INR, PROTIME in the last 168 hours. Cardiac Enzymes: No results for input(s): CKTOTAL, CKMB, CKMBINDEX, TROPONINI in the last 168 hours. BNP (last 3 results) No results for input(s): PROBNP in the last 8760 hours. HbA1C: No results for input(s): HGBA1C in the last 72 hours. CBG: Recent Labs  Lab 08/26/19 0747  GLUCAP 80   Lipid Profile: No results for input(s): CHOL, HDL, LDLCALC, TRIG, CHOLHDL, LDLDIRECT in the last 72 hours. Thyroid Function Tests: No results for input(s): TSH, T4TOTAL, FREET4, T3FREE, THYROIDAB in the last 72 hours. Anemia Panel: No results for input(s): VITAMINB12, FOLATE, FERRITIN, TIBC, IRON, RETICCTPCT in the last 72 hours. Sepsis Labs: No results for input(s): PROCALCITON, LATICACIDVEN in the last 168 hours.  Recent Results (from the past 240 hour(s))  Respiratory Panel by RT PCR (Flu A&B, Covid) - Nasopharyngeal Swab     Status: None   Collection Time: 08/25/19 10:30 PM   Specimen: Nasopharyngeal Swab  Result Value Ref Range Status   SARS Coronavirus 2 by RT PCR NEGATIVE NEGATIVE Final    Comment: (NOTE) SARS-CoV-2 target nucleic acids are NOT DETECTED. The SARS-CoV-2 RNA is generally detectable in upper respiratoy specimens during the acute phase of infection. The lowest concentration of SARS-CoV-2 viral copies this assay can detect is 131 copies/mL. A negative result does not preclude SARS-Cov-2 infection and should not be used as the sole basis for treatment or other patient management decisions. A negative result may occur with  improper specimen collection/handling, submission of specimen other than nasopharyngeal swab, presence of viral mutation(s) within the areas targeted by this assay, and inadequate number of viral copies (<131 copies/mL). A  negative result must be combined with clinical observations, patient history, and epidemiological information. The expected result is Negative. Fact Sheet for Patients:  PinkCheek.be Fact Sheet for Healthcare Providers:  GravelBags.it This test is not yet ap proved or cleared by the [place] FDA and  has been authorized for detection and/or diagnosis of SARS-CoV-2 by FDA under an Emergency Use Authorization (EUA). This EUA will remain  in effect (meaning this test can be used) for the duration of the COVID-19 declaration under Section 564(b)(1) of the Act, 21 U.S.C. section 360bbb-3(b)(1), unless the authorization is terminated or revoked sooner.    Influenza A by PCR NEGATIVE NEGATIVE Final   Influenza B by PCR NEGATIVE NEGATIVE Final    Comment: (NOTE) The Xpert Xpress SARS-CoV-2/FLU/RSV assay is intended as an aid in  the diagnosis of influenza from Nasopharyngeal swab specimens and  should not be used as a sole basis for  treatment. Nasal washings and  aspirates are unacceptable for Xpert Xpress SARS-CoV-2/FLU/RSV  testing. Fact Sheet for Patients: PinkCheek.be Fact Sheet for Healthcare Providers: GravelBags.it This test is not yet approved or cleared by the [place] FDA and  has been authorized for detection and/or diagnosis of SARS-CoV-2 by  FDA under an Emergency Use Authorization (EUA). This EUA will remain  in effect (meaning this test can be used) for the duration of the  Covid-19 declaration under Section 564(b)(1) of the Act, 21  U.S.C. section 360bbb-3(b)(1), unless the authorization is  terminated or revoked. Performed at [facility], [place] [address], [place], [place] [address]          Radiology Studies: DG Chest 2 View  Result Date: 08/25/2019 CLINICAL DATA:  Syncope EXAM: CHEST - 2 VIEW COMPARISON:  11/02/2017  FINDINGS: Frontal and lateral views of the chest demonstrate a stable cardiac silhouette. The patient is rotated toward the right, which accentuates the thoracic aortic ectasia seen previously. No airspace disease, effusion, or pneumothorax. IMPRESSION: 1. No acute intrathoracic process. Electronically Signed   By: Randa Ngo M.D.   On: 08/25/2019 21:54   CT Head Wo Contrast  Result Date: 08/25/2019 CLINICAL DATA:  86 year old female with encephalopathy. EXAM: CT HEAD WITHOUT CONTRAST TECHNIQUE: Contiguous axial images were obtained from the base of the skull through the vertex without intravenous contrast. COMPARISON:  Head CT dated 07/26/2019. FINDINGS: Brain: There is mild age-related atrophy and chronic microvascular ischemic changes. There is no acute intracranial hemorrhage. No mass effect or midline shift. No extra-axial fluid collection. Vascular: No hyperdense vessel or unexpected calcification. Skull: No acute calvarial pathology. There is mottled appearance of the skull with diffuse tiny lucencies with salt and pepper appearance of the calvarium which can be seen in the setting of hyperthyroidism. Clinical correlation is recommended. Sinuses/Orbits: Left maxillary sinus retention cyst or polyp. No air-fluid level. The mastoid air cells are clear. Other: None IMPRESSION: 1. No acute intracranial pathology. 2. Mild age-related atrophy and chronic microvascular ischemic changes. Electronically Signed   By: Anner Crete M.D.   On: 08/25/2019 19:25   EEG adult  Result Date: 08/26/2019 Lora Havens, MD     08/26/2019  2:57 PM Patient Name: Shawna Lin MRN: [id number] Epilepsy Attending: Lora Havens Referring Physician/Provider: Dr. Shela Leff Date: 08/26/2019 Duration: 22.54 minutes Patient history: 86 year old female who presented with recurrent syncope.  EEG to evaluate for seizures. Level of alertness: Awake AEDs during EEG study: Keppra Technical aspects: This EEG study was  done with scalp electrodes positioned according to the 10-20 International system of electrode placement. Electrical activity was acquired at a sampling rate of 500Hz  and reviewed with a high frequency filter of 70Hz  and a low frequency filter of 1Hz . EEG data were recorded continuously and digitally stored. Description: During awake state, no clear posterior dominant rhythm was seen.  EEG showed 15 to 18 Hz patient beta activity with irregular morphology distributed symmetrically and diffusely.  Physiologic photic driving was seen during photic stimulation.  Hyperventilation was not performed. Abnormality -Beta activity, generalized IMPRESSION: This study is within normal limits. The beta activity seen in the background is a benign EEG pattern which can be seen in patients with benzodiazepine use. No seizures or epileptiform discharges were seen throughout the recording. Lora Havens   ECHOCARDIOGRAM COMPLETE  Result Date: 08/26/2019    ECHOCARDIOGRAM REPORT   Patient Name:   Shawna Lin Date of Exam: 08/26/2019 Medical Rec #:  [id number]  Height:       63.0 in Accession #:    [id number]    Weight:       165.0 lb Date of Birth:  [birth year]      BSA:          1.78 m Patient Age:    7 years      BP:           155/63 mmHg Patient Gender: F             HR:           67 bpm. Exam Location:  Inpatient Procedure: 2D Echo, Color Doppler and Cardiac Doppler Indications:    R55 Syncope  History:        Patient has prior history of Echocardiogram examinations, most                 recent 12/22/2015. Signs/Symptoms:Syncope; Risk                 Factors:Hypertension.  Sonographer:    Raquel Sarna Senior RDCS Referring Phys: [id number] [place]  1. Left ventricular ejection fraction, by estimation, is 65 to 70%. The left ventricle has normal function. The left ventrical has no regional wall motion abnormalities. There is mildly increased left ventricular hypertrophy. Left ventricular diastolic parameters are  consistent with Grade I diastolic dysfunction (impaired relaxation). LV filling pressures not assessed.  2. Right ventricular systolic function was not well visualized. The right ventricular size is not well visualized.  3. No evidence of mitral valve regurgitation.  4. The aortic valve has an indeterminant number of cusps. Aortic valve regurgitation is not visualized. Mild to moderate aortic valve stenosis. Aortic valve area, by VTI measures 1.44 cm. Aortic valve mean gradient measures 10.0 mmHg. Aortic valve Vmax  measures 2.35 m/s.  5. The inferior vena cava is normal in size with greater than 50% respiratory variability, suggesting right atrial pressure of 3 mmHg. FINDINGS  Left Ventricle: Left ventricular ejection fraction, by estimation, is 65 to 70%. The left ventricle has normal function. The left ventricle has no regional wall motion abnormalities. The left ventricular internal cavity size was normal in size. There is  mildly increased left ventricular hypertrophy. Concentric left ventricular hypertrophy. Left ventricular diastolic parameters are consistent with Grade I diastolic dysfunction (impaired relaxation). Right Ventricle: The right ventricular size is not well visualized. Right vetricular wall thickness was not assessed. Right ventricular systolic function was not well visualized. Left Atrium: Left atrial size was normal in size. Right Atrium: Right atrial size was normal in size. Pericardium: There is no evidence of pericardial effusion. Mitral Valve: The mitral valve is normal in structure and function. Normal mobility of the mitral valve leaflets. No evidence of mitral valve regurgitation. No evidence of mitral valve stenosis. Tricuspid Valve: The tricuspid valve is normal in structure. Tricuspid valve regurgitation is not demonstrated. No evidence of tricuspid stenosis. Aortic Valve: The aortic valve has an indeterminant number of cusps. . There is moderate thickening and severe calcifcation  of the aortic valve. Aortic valve regurgitation is not visualized. Mild to moderate aortic stenosis is present. There is moderate thickening of the aortic valve. There is severe calcifcation of the aortic valve. Aortic valve mean gradient measures 10.0 mmHg. Aortic valve peak gradient measures 22.1 mmHg. Aortic valve area, by VTI measures 1.44 cm. Pulmonic Valve: The pulmonic valve was normal in structure. Pulmonic valve regurgitation is not visualized. No evidence of pulmonic stenosis. Aorta: The aortic root is  normal in size and structure. Venous: The inferior vena cava is normal in size with greater than 50% respiratory variability, suggesting right atrial pressure of 3 mmHg. The inferior vena cava and the hepatic vein show a normal flow pattern. IAS/Shunts: No atrial level shunt detected by color flow Doppler.  LEFT VENTRICLE PLAX 2D LVIDd:         2.70 cm LVIDs:         1.50 cm LV PW:         1.20 cm LV IVS:        1.20 cm LVOT diam:     1.80 cm LV SV:         57.00 ml LV SV Index:   11.34 LVOT Area:     2.54 cm  LEFT ATRIUM           Index LA diam:      2.90 cm 1.63 cm/m LA Vol (A2C): 23.4 ml 13.13 ml/m LA Vol (A4C): 32.3 ml 18.13 ml/m  AORTIC VALVE AV Area (Vmax):    1.15 cm AV Area (Vmean):   1.46 cm AV Area (VTI):     1.44 cm AV Vmax:           235.00 cm/s AV Vmean:          146.000 cm/s AV VTI:            0.396 m AV Peak Grad:      22.1 mmHg AV Mean Grad:      10.0 mmHg LVOT Vmax:         106.00 cm/s LVOT Vmean:        83.900 cm/s LVOT VTI:          0.224 m LVOT/AV VTI ratio: 0.57  AORTA Ao Root diam: 2.40 cm Ao Asc diam:  2.80 cm MITRAL VALVE MV Area (PHT): 2.71 cm              SHUNTS MV Decel Time: 280 msec              Systemic VTI:  0.22 m MV E velocity: 72.80 cm/s  103 cm/s  Systemic Diam: 1.80 cm MV A velocity: 113.00 cm/s 70.3 cm/s MV E/A ratio:  0.64        1.5 Fransico Him MD Electronically signed by Fransico Him MD Signature Date/Time: 08/26/2019/9:52:27 AM    Final    VAS US  CAROTID  Result Date: 08/26/2019 Carotid Arterial Duplex Study Indications:       Syncope. Risk Factors:      Hypertension. Comparison Study:  No prior studies. Performing Technologist: Oliver Hum RVT  Examination Guidelines: A complete evaluation includes B-mode imaging, spectral Doppler, color Doppler, and power Doppler as needed of all accessible portions of each vessel. Bilateral testing is considered an integral part of a complete examination. Limited examinations for reoccurring indications may be performed as noted.  Right Carotid Findings: +----------+--------+--------+--------+-----------------------+--------+           PSV cm/sEDV cm/sStenosisPlaque Description     Comments +----------+--------+--------+--------+-----------------------+--------+ CCA Prox  123     23              smooth and heterogenoustortuous +----------+--------+--------+--------+-----------------------+--------+ CCA Distal58      17              smooth and heterogenous         +----------+--------+--------+--------+-----------------------+--------+ ICA Prox  50      9  smooth and heterogenous         +----------+--------+--------+--------+-----------------------+--------+ ICA Distal90      27                                     tortuous +----------+--------+--------+--------+-----------------------+--------+ ECA       81      11                                              +----------+--------+--------+--------+-----------------------+--------+ +----------+--------+-------+--------+-------------------+           PSV cm/sEDV cmsDescribeArm Pressure (mmHG) +----------+--------+-------+--------+-------------------+ [id number]                                        +----------+--------+-------+--------+-------------------+ +---------+--------+--+--------+-+---------+ VertebralPSV cm/s52EDV cm/s8Antegrade +---------+--------+--+--------+-+---------+  Left  Carotid Findings: +----------+--------+--------+--------+-----------------------+--------+           PSV cm/sEDV cm/sStenosisPlaque Description     Comments +----------+--------+--------+--------+-----------------------+--------+ CCA Prox  119     21              smooth and heterogenoustortuous +----------+--------+--------+--------+-----------------------+--------+ CCA Distal90      20              smooth and heterogenous         +----------+--------+--------+--------+-----------------------+--------+ ICA Prox  61      18              smooth and heterogenous         +----------+--------+--------+--------+-----------------------+--------+ ICA Distal65      19                                     tortuous +----------+--------+--------+--------+-----------------------+--------+ ECA       91      13                                              +----------+--------+--------+--------+-----------------------+--------+ +----------+--------+--------+--------+-------------------+           PSV cm/sEDV cm/sDescribeArm Pressure (mmHG) +----------+--------+--------+--------+-------------------+ [id number]                                         +----------+--------+--------+--------+-------------------+ +---------+--------+--+--------+-+---------+ VertebralPSV cm/s42EDV cm/s9Antegrade +---------+--------+--+--------+-+---------+   Summary: Right Carotid: Velocities in the right ICA are consistent with a 1-39% stenosis. Left Carotid: Velocities in the left ICA are consistent with a 1-39% stenosis. Vertebrals: Bilateral vertebral arteries demonstrate antegrade flow. *See table(s) above for measurements and observations.     Preliminary         Scheduled Meds: . aspirin  81 mg Oral Daily  . cholecalciferol  1,000 Units Oral Daily  . enoxaparin (LOVENOX) injection  40 mg Subcutaneous Q24H  . feeding supplement (ENSURE ENLIVE)  237 mL Oral TID BM  .  levETIRAcetam  250 mg Oral BID  . sodium chloride flush  3 mL Intravenous Q12H   Continuous Infusions:   LOS: 0 days  Hosie Poisson, MD [facility]   To contact the attending provider between 7A-7P or the covering provider during after hours 7P-7A, please log into the web site [email or web address] and access using universal [place] password for that web site. If you do not have the password, please call the hospital operator.  08/26/2019, 3:53 PM

## 2019-08-27 ENCOUNTER — Inpatient Hospital Stay (HOSPITAL_COMMUNITY): Payer: Medicare Other

## 2019-08-27 ENCOUNTER — Encounter (HOSPITAL_COMMUNITY): Payer: Self-pay | Admitting: Internal Medicine

## 2019-08-27 DIAGNOSIS — R55 Syncope and collapse: Secondary | ICD-10-CM

## 2019-08-27 LAB — CBC WITH DIFFERENTIAL/PLATELET
Abs Immature Granulocytes: 0.07 10*3/uL (ref 0.00–0.07)
Basophils Absolute: 0 10*3/uL (ref 0.0–0.1)
Basophils Relative: 1 %
Eosinophils Absolute: 0.1 10*3/uL (ref 0.0–0.5)
Eosinophils Relative: 3 %
HCT: 31.5 % — ABNORMAL LOW (ref 36.0–46.0)
Hemoglobin: 9.4 g/dL — ABNORMAL LOW (ref 12.0–15.0)
Immature Granulocytes: 2 %
Lymphocytes Relative: 43 %
Lymphs Abs: 2 10*3/uL (ref 0.7–4.0)
MCH: 25.7 pg — ABNORMAL LOW (ref 26.0–34.0)
MCHC: 29.8 g/dL — ABNORMAL LOW (ref 30.0–36.0)
MCV: 86.1 fL (ref 80.0–100.0)
Monocytes Absolute: 0.4 10*3/uL (ref 0.1–1.0)
Monocytes Relative: 10 %
Neutro Abs: 1.8 10*3/uL (ref 1.7–7.7)
Neutrophils Relative %: 41 %
Platelets: 167 10*3/uL (ref 150–400)
RBC: 3.66 MIL/uL — ABNORMAL LOW (ref 3.87–5.11)
RDW: 19.4 % — ABNORMAL HIGH (ref 11.5–15.5)
WBC: 4.4 10*3/uL (ref 4.0–10.5)
nRBC: 1.8 % — ABNORMAL HIGH (ref 0.0–0.2)

## 2019-08-27 LAB — BASIC METABOLIC PANEL
Anion gap: 7 (ref 5–15)
BUN: 20 mg/dL (ref 8–23)
CO2: 26 mmol/L (ref 22–32)
Calcium: 9.6 mg/dL (ref 8.9–10.3)
Chloride: 109 mmol/L (ref 98–111)
Creatinine, Ser: 1.06 mg/dL — ABNORMAL HIGH (ref 0.44–1.00)
GFR calc Af Amer: 54 mL/min — ABNORMAL LOW (ref >60)
GFR calc non Af Amer: 47 mL/min — ABNORMAL LOW (ref >60)
Glucose, Bld: 131 mg/dL — ABNORMAL HIGH (ref 70–99)
Potassium: 3.7 mmol/L (ref 3.5–5.1)
Sodium: 142 mmol/L (ref 135–145)

## 2019-08-27 LAB — MRSA PCR SCREENING: MRSA by PCR: NEGATIVE

## 2019-08-27 LAB — GLUCOSE, CAPILLARY: Glucose-Capillary: 77 mg/dL (ref 70–99)

## 2019-08-27 MED ORDER — LORAZEPAM 2 MG/ML IJ SOLN
1.0000 mg | Freq: Once | INTRAMUSCULAR | Status: AC
  Administered 2019-08-27: 1 mg via INTRAVENOUS
  Filled 2019-08-27: qty 1

## 2019-08-27 NOTE — TOC Initial Note (Signed)
Transition of Care [facility]) - Initial/Assessment Note    Patient Details  Name: Shawna Lin MRN: [id number] Date of Birth: 1929/04/17  Transition of Care [facility]) CM/SW Contact:    Trish Mage, LCSW Phone Number: 08/27/2019, 10:40 AM  Clinical Narrative:   Patient from [facility] will return at d/c.  Will need FL2 at d/c, COVID within 72 hours. TOC will continue to follow during the course of hospitalization.                 Expected Discharge Plan: Memory Care Barriers to Discharge: No Barriers Identified   Patient Goals and CMS Choice        Expected Discharge Plan and Services Expected Discharge Plan: Memory Care   Discharge Planning Services: CM Consult   Living arrangements for the past 2 months: [place]                                      Prior Living Arrangements/Services Living arrangements for the past 2 months: [place] Lives with:: Facility Resident Patient language and need for interpreter reviewed:: Yes Do you feel safe going back to the place where you live?: Yes      Need for Family Participation in Patient Care: No (Comment) Care giver support system in place?: Yes (comment) Current home services: DME Criminal Activity/Legal Involvement Pertinent to Current Situation/Hospitalization: No - Comment as needed  Activities of Daily Living Home Assistive Devices/Equipment: Grab bars around toilet, Grab bars in shower, Hand-held shower hose, Blood pressure cuff, Scales, Walker (specify type)(front wheeled walker-[place] [facility] [place] has necessary equipment for their residents) ADL Screening (condition at time of admission) Patient's cognitive ability adequate to safely complete daily activities?: No Is the patient deaf or have difficulty hearing?: Yes(slight hoh) Does the patient have difficulty seeing, even when wearing glasses/contacts?: No Does the patient have difficulty concentrating,  remembering, or making decisions?: Yes Patient able to express need for assistance with ADLs?: No Does the patient have difficulty dressing or bathing?: Yes Independently performs ADLs?: No Communication: Independent Dressing (OT): Needs assistance Is this a change from baseline?: Pre-admission baseline Grooming: Needs assistance Is this a change from baseline?: Pre-admission baseline Feeding: Needs assistance Is this a change from baseline?: Pre-admission baseline Bathing: Needs assistance Is this a change from baseline?: Pre-admission baseline Toileting: Needs assistance Is this a change from baseline?: Pre-admission baseline In/Out Bed: Needs assistance Is this a change from baseline?: Pre-admission baseline Walks in Home: Needs assistance Is this a change from baseline?: Pre-admission baseline Does the patient have difficulty walking or climbing stairs?: Yes(secondary to weakness) Weakness of Legs: Both Weakness of Arms/Hands: None  Permission Sought/Granted Permission sought to share information with : Family Supports Permission granted to share information with : Yes, Verbal Permission Granted  Share Information with NAME: Carlyle Basques     Permission granted to share info w Relationship: neice  Permission granted to share info w Contact Information: [id number]  Emotional Assessment Appearance:: Appears stated age     Orientation: : Fluctuating Orientation (Suspected and/or reported Sundowners) Alcohol / Substance Use: Not Applicable    Admission diagnosis:  Syncope [R55] Recurrent syncope [R55] Patient Active Problem List   Diagnosis Date Noted  . Syncope 08/26/2019  . Recurrent syncope 08/25/2019  . QT prolongation 08/25/2019  . Seizure disorder ([place]) 08/25/2019  . Pain due to onychomycosis of toenails of  both feet 01/07/2019  . Dementia ([place]) 12/21/2015  . No blood products 12/21/2015  . 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 Memory loss 09/02/2015  . HTN  (hypertension) 09/02/2015   PCP:  London Pepper, MD Pharmacy:  No Pharmacies Listed    Social Determinants of Health (SDOH) Interventions    Readmission Risk Interventions No flowsheet data found.

## 2019-08-27 NOTE — Progress Notes (Signed)
PROGRESS NOTE    Shawna Lin  NOM:[id number] DOB: 1929/07/13 DOA: 08/25/2019 PCP: London Pepper, MD   Brief Narrative:  86 year old lady with prior history of dementia, hypertension, vitamin D deficiency comes in to ED for recurrent syncope, always happens after or during her meals.  This is her third admission to the hospital. EKG on admission shows prolonged QTC of 533. CT head without contrast neg for acute stroke.  EEG negative for epileptiform activity.  Echocardiogram shows LVEF of 65% to 70% . Left ventricular diastolic  parameters are consistent with Grade I diastolic dysfunction (impaired  relaxation). Mild to moderate aortic valve stenosis.  Aortic valve area, by VTI measures 1.44 cm. Aortic valve mean gradient  measures 10.0 mmHg. Aortic valve Vmax measures 2.35 m/s.   Assessment & Plan:   Principal Problem:   Recurrent syncope Active Problems:   HTN (hypertension)   Dementia (HCC)   QT prolongation   Seizure disorder (HCC)   Syncope   Recurrent syncope ?  Vasovagal syncope.  Seizures have been ruled out with a negative EEG.  Echocardiogram ruled out structural heart disease. Overnight telemetry does not show any cardiac ARRHYTHMIA Repeat EKG shows QTC has improved.  CT of the head is negative for acute intracranial abnormality. Will order an MRI of the brain without contrast for recurrent PE.  No signs of infection so far. Carotid duplex is not significant.  Orthostatic vital signs are negative.  PT evaluation and fall precautions. Plan for discharge tomorrow if MRI of the brain is negative.  Cardiology consulted for recurrent syncope recommended outpatient event monitor placement to check for cardiac arrhythmias   History of seizure disorder As per the patient's guardian she was recently started on [place] at [facility].    Alzheimer's dementia meds on hold due to prolonged QTC.    Essential hypertension Well-controlled   Mild normocytic anemia Hemoglobin  stable around nine.   Stage II  CKD Creatinine appears to be at baseline.    DVT prophylaxis:Lovenox Code Status: Full code Family Communication: discussed with guardian over the phone.  Disposition Plan:   Patient came from:ALF.              Anticipated d/c place: [facility]  Barriers to d/c OR conditions which need to be met to effect a safe d/c: Possible discharge back to [facility] with PT and OT if MRI of the brain is negative  Consultants:   Cardiology  Procedures:  ECHO EEG  Antimicrobials: none  Subjective: Patient is pleasantly confused, denies any chest pain or shortness of breath, dizziness, nausea, vomiting or abdominal pain  Objective: Vitals:   08/27/19 0933 08/27/19 0936 08/27/19 1217 08/27/19 1550  BP: (!) 166/70 (!) 152/83 122/66 136/78  Pulse: 76 88 71 66  Resp: 15 14 14 18   Temp:   98.7 F (37.1 C) 98.2 F (36.8 C)  TempSrc:   Oral Oral  SpO2: 98% 99% 99% 100%  Weight:        Intake/Output Summary (Last 24 hours) at 08/27/2019 1606 Last data filed at 08/27/2019 1140 Gross per 24 hour  Intake 720 ml  Output --  Net 720 ml   Filed Weights   08/26/19 0021 08/27/19 0500  Weight: 74.8 kg 75.3 kg    Examination:  General exam: Alert and not in any kind of distress. Respiratory system: Clear to auscultation bilaterally, no wheezing or rhonchi Cardiovascular system: S1-S2 heard, regular rate rhythm, no JVD, no pedal edema Gastrointestinal system: Abdomen is soft,  nontender, bowel sounds normal Central nervous system: Patient is pleasantly confused, able to move all extremities.  Extremities: No pedal edema Skin: No rashes Psychiatry: cannot be assessed.     Data Reviewed: I have personally reviewed following labs and imaging studies  CBC: Recent Labs  Lab 08/25/19 1857 08/26/19 0457 08/27/19 0824  WBC 6.3 6.1 4.4  NEUTROABS 3.9  --  1.8  HGB 9.7* 9.5* 9.4*  HCT 33.3* 31.7* 31.5*  MCV 85.8 85.2 86.1  PLT 221 183 024    Basic Metabolic Panel: Recent Labs  Lab 08/25/19 1857 08/25/19 2130 08/26/19 0457 08/27/19 0824  NA 144  --  143 142  K 3.5  --  3.5   3.5 3.7  CL 104  --  107 109  CO2 28  --  27 26  GLUCOSE 174*  --  82 131*  BUN 22  --  20 20  CREATININE 1.29*  --  1.04* 1.06*  CALCIUM 9.7  --  9.7 9.6  MG  --  2.1  --   --    GFR: Estimated Creatinine Clearance: 35.7 mL/min (A) (by C-G formula based on SCr of 1.06 mg/dL (H)). Liver Function Tests: Recent Labs  Lab 08/25/19 1857  AST 26  ALT 14  ALKPHOS 109  BILITOT 0.5  PROT 7.2  ALBUMIN 4.2   No results for input(s): LIPASE, AMYLASE in the last 168 hours. No results for input(s): AMMONIA in the last 168 hours. Coagulation Profile: No results for input(s): INR, PROTIME in the last 168 hours. Cardiac Enzymes: No results for input(s): CKTOTAL, CKMB, CKMBINDEX, TROPONINI in the last 168 hours. BNP (last 3 results) No results for input(s): PROBNP in the last 8760 hours. HbA1C: No results for input(s): HGBA1C in the last 72 hours. CBG: Recent Labs  Lab 08/26/19 0747 08/27/19 0728  GLUCAP 80 77   Lipid Profile: No results for input(s): CHOL, HDL, LDLCALC, TRIG, CHOLHDL, LDLDIRECT in the last 72 hours. Thyroid Function Tests: No results for input(s): TSH, T4TOTAL, FREET4, T3FREE, THYROIDAB in the last 72 hours. Anemia Panel: No results for input(s): VITAMINB12, FOLATE, FERRITIN, TIBC, IRON, RETICCTPCT in the last 72 hours. Sepsis Labs: No results for input(s): PROCALCITON, LATICACIDVEN in the last 168 hours.  Recent Results (from the past 240 hour(s))  Respiratory Panel by RT PCR (Flu A&B, Covid) - Nasopharyngeal Swab     Status: None   Collection Time: 08/25/19 10:30 PM   Specimen: Nasopharyngeal Swab  Result Value Ref Range Status   SARS Coronavirus 2 by RT PCR NEGATIVE NEGATIVE Final    Comment: (NOTE) SARS-CoV-2 target nucleic acids are NOT DETECTED. The SARS-CoV-2 RNA is generally detectable in upper  respiratoy specimens during the acute phase of infection. The lowest concentration of SARS-CoV-2 viral copies this assay can detect is 131 copies/mL. A negative result does not preclude SARS-Cov-2 infection and should not be used as the sole basis for treatment or other patient management decisions. A negative result may occur with  improper specimen collection/handling, submission of specimen other than nasopharyngeal swab, presence of viral mutation(s) within the areas targeted by this assay, and inadequate number of viral copies (<131 copies/mL). A negative result must be combined with clinical observations, patient history, and epidemiological information. The expected result is Negative. Fact Sheet for Patients:  PinkCheek.be Fact Sheet for Healthcare Providers:  GravelBags.it This test is not yet ap proved or cleared by the [place] FDA and  has been authorized for detection and/or diagnosis of SARS-CoV-2  by FDA under an Emergency Use Authorization (EUA). This EUA will remain  in effect (meaning this test can be used) for the duration of the COVID-19 declaration under Section 564(b)(1) of the Act, 21 U.S.C. section 360bbb-3(b)(1), unless the authorization is terminated or revoked sooner.    Influenza A by PCR NEGATIVE NEGATIVE Final   Influenza B by PCR NEGATIVE NEGATIVE Final    Comment: (NOTE) The Xpert Xpress SARS-CoV-2/FLU/RSV assay is intended as an aid in  the diagnosis of influenza from Nasopharyngeal swab specimens and  should not be used as a sole basis for treatment. Nasal washings and  aspirates are unacceptable for Xpert Xpress SARS-CoV-2/FLU/RSV  testing. Fact Sheet for Patients: PinkCheek.be Fact Sheet for Healthcare Providers: GravelBags.it This test is not yet approved or cleared by the [place] FDA and  has been authorized for  detection and/or diagnosis of SARS-CoV-2 by  FDA under an Emergency Use Authorization (EUA). This EUA will remain  in effect (meaning this test can be used) for the duration of the  Covid-19 declaration under Section 564(b)(1) of the Act, 21  U.S.C. section 360bbb-3(b)(1), unless the authorization is  terminated or revoked. Performed at [facility], [place] [address] [place], [place] [address]   MRSA PCR Screening     Status: None   Collection Time: 08/27/19 10:23 AM   Specimen: Nasal Mucosa; Nasopharyngeal  Result Value Ref Range Status   MRSA by PCR NEGATIVE NEGATIVE Final    Comment:        The GeneXpert MRSA Assay (FDA approved for NASAL specimens only), is one component of a comprehensive MRSA colonization surveillance program. It is not intended to diagnose MRSA infection nor to guide or monitor treatment for MRSA infections. Performed at [facility], [place] [address] [place], [place] [address]          Radiology Studies: DG Chest 2 View  Result Date: 08/25/2019 CLINICAL DATA:  Syncope EXAM: CHEST - 2 VIEW COMPARISON:  11/02/2017 FINDINGS: Frontal and lateral views of the chest demonstrate a stable cardiac silhouette. The patient is rotated toward the right, which accentuates the thoracic aortic ectasia seen previously. No airspace disease, effusion, or pneumothorax. IMPRESSION: 1. No acute intrathoracic process. Electronically Signed   By: Randa Ngo M.D.   On: 08/25/2019 21:54   CT Head Wo Contrast  Result Date: 08/25/2019 CLINICAL DATA:  86 year old female with encephalopathy. EXAM: CT HEAD WITHOUT CONTRAST TECHNIQUE: Contiguous axial images were obtained from the base of the skull through the vertex without intravenous contrast. COMPARISON:  Head CT dated 07/26/2019. FINDINGS: Brain: There is mild age-related atrophy and chronic microvascular ischemic changes. There is no acute intracranial hemorrhage. No mass effect or midline  shift. No extra-axial fluid collection. Vascular: No hyperdense vessel or unexpected calcification. Skull: No acute calvarial pathology. There is mottled appearance of the skull with diffuse tiny lucencies with salt and pepper appearance of the calvarium which can be seen in the setting of hyperthyroidism. Clinical correlation is recommended. Sinuses/Orbits: Left maxillary sinus retention cyst or polyp. No air-fluid level. The mastoid air cells are clear. Other: None IMPRESSION: 1. No acute intracranial pathology. 2. Mild age-related atrophy and chronic microvascular ischemic changes. Electronically Signed   By: Anner Crete M.D.   On: 08/25/2019 19:25   EEG adult  Result Date: 08/26/2019 Lora Havens, MD     08/26/2019  2:57 PM Patient Name: Aviella Disbrow MRN: [id number] Epilepsy Attending: Lora Havens Referring Physician/Provider: Dr. Shela Leff  Date: 08/26/2019 Duration: 22.54 minutes Patient history: 86 year old female who presented with recurrent syncope.  EEG to evaluate for seizures. Level of alertness: Awake AEDs during EEG study: [place] Technical aspects: This EEG study was done with scalp electrodes positioned according to the 10-20 International system of electrode placement. Electrical activity was acquired at a sampling rate of 500Hz  and reviewed with a high frequency filter of 70Hz  and a low frequency filter of 1Hz . EEG data were recorded continuously and digitally stored. Description: During awake state, no clear posterior dominant rhythm was seen.  EEG showed 15 to 18 Hz patient beta activity with irregular morphology distributed symmetrically and diffusely.  Physiologic photic driving was seen during photic stimulation.  Hyperventilation was not performed. Abnormality -Beta activity, generalized IMPRESSION: This study is within normal limits. The beta activity seen in the background is a benign EEG pattern which can be seen in patients with benzodiazepine use. No seizures or  epileptiform discharges were seen throughout the recording. Lora Havens   ECHOCARDIOGRAM COMPLETE  Result Date: 08/26/2019    ECHOCARDIOGRAM REPORT   Patient Name:   AYRIANA WIX Date of Exam: 08/26/2019 Medical Rec #:  [id number]     Height:       63.0 in Accession #:    [id number]    Weight:       165.0 lb Date of Birth:  11/07/1928      BSA:          1.78 m Patient Age:    78 years      BP:           155/63 mmHg Patient Gender: F             HR:           67 bpm. Exam Location:  Inpatient Procedure: 2D Echo, Color Doppler and Cardiac Doppler Indications:    R55 Syncope  History:        Patient has prior history of Echocardiogram examinations, most                 recent 12/22/2015. Signs/Symptoms:Syncope; Risk                 Factors:Hypertension.  Sonographer:    Raquel Sarna Senior RDCS Referring Phys: [id number] [place]  1. Left ventricular ejection fraction, by estimation, is 65 to 70%. The left ventricle has normal function. The left ventrical has no regional wall motion abnormalities. There is mildly increased left ventricular hypertrophy. Left ventricular diastolic parameters are consistent with Grade I diastolic dysfunction (impaired relaxation). LV filling pressures not assessed.  2. Right ventricular systolic function was not well visualized. The right ventricular size is not well visualized.  3. No evidence of mitral valve regurgitation.  4. The aortic valve has an indeterminant number of cusps. Aortic valve regurgitation is not visualized. Mild to moderate aortic valve stenosis. Aortic valve area, by VTI measures 1.44 cm. Aortic valve mean gradient measures 10.0 mmHg. Aortic valve Vmax  measures 2.35 m/s.  5. The inferior vena cava is normal in size with greater than 50% respiratory variability, suggesting right atrial pressure of 3 mmHg. FINDINGS  Left Ventricle: Left ventricular ejection fraction, by estimation, is 65 to 70%. The left ventricle has normal function. The left  ventricle has no regional wall motion abnormalities. The left ventricular internal cavity size was normal in size. There is  mildly increased left ventricular hypertrophy. Concentric left ventricular hypertrophy. Left ventricular diastolic parameters are consistent with Grade I  diastolic dysfunction (impaired relaxation). Right Ventricle: The right ventricular size is not well visualized. Right vetricular wall thickness was not assessed. Right ventricular systolic function was not well visualized. Left Atrium: Left atrial size was normal in size. Right Atrium: Right atrial size was normal in size. Pericardium: There is no evidence of pericardial effusion. Mitral Valve: The mitral valve is normal in structure and function. Normal mobility of the mitral valve leaflets. No evidence of mitral valve regurgitation. No evidence of mitral valve stenosis. Tricuspid Valve: The tricuspid valve is normal in structure. Tricuspid valve regurgitation is not demonstrated. No evidence of tricuspid stenosis. Aortic Valve: The aortic valve has an indeterminant number of cusps. . There is moderate thickening and severe calcifcation of the aortic valve. Aortic valve regurgitation is not visualized. Mild to moderate aortic stenosis is present. There is moderate thickening of the aortic valve. There is severe calcifcation of the aortic valve. Aortic valve mean gradient measures 10.0 mmHg. Aortic valve peak gradient measures 22.1 mmHg. Aortic valve area, by VTI measures 1.44 cm. Pulmonic Valve: The pulmonic valve was normal in structure. Pulmonic valve regurgitation is not visualized. No evidence of pulmonic stenosis. Aorta: The aortic root is normal in size and structure. Venous: The inferior vena cava is normal in size with greater than 50% respiratory variability, suggesting right atrial pressure of 3 mmHg. The inferior vena cava and the hepatic vein show a normal flow pattern. IAS/Shunts: No atrial level shunt detected by color flow  Doppler.  LEFT VENTRICLE PLAX 2D LVIDd:         2.70 cm LVIDs:         1.50 cm LV PW:         1.20 cm LV IVS:        1.20 cm LVOT diam:     1.80 cm LV SV:         57.00 ml LV SV Index:   11.34 LVOT Area:     2.54 cm  LEFT ATRIUM           Index LA diam:      2.90 cm 1.63 cm/m LA Vol (A2C): 23.4 ml 13.13 ml/m LA Vol (A4C): 32.3 ml 18.13 ml/m  AORTIC VALVE AV Area (Vmax):    1.15 cm AV Area (Vmean):   1.46 cm AV Area (VTI):     1.44 cm AV Vmax:           235.00 cm/s AV Vmean:          146.000 cm/s AV VTI:            0.396 m AV Peak Grad:      22.1 mmHg AV Mean Grad:      10.0 mmHg LVOT Vmax:         106.00 cm/s LVOT Vmean:        83.900 cm/s LVOT VTI:          0.224 m LVOT/AV VTI ratio: 0.57  AORTA Ao Root diam: 2.40 cm Ao Asc diam:  2.80 cm MITRAL VALVE MV Area (PHT): 2.71 cm              SHUNTS MV Decel Time: 280 msec              Systemic VTI:  0.22 m MV E velocity: 72.80 cm/s  103 cm/s  Systemic Diam: 1.80 cm MV A velocity: 113.00 cm/s 70.3 cm/s MV E/A ratio:  0.64        1.5 Fransico Him MD  Electronically signed by Fransico Him MD Signature Date/Time: 08/26/2019/9:52:27 AM    Final    VAS US CAROTID  Result Date: 08/27/2019 Carotid Arterial Duplex Study Indications:       Syncope. Risk Factors:      Hypertension. Comparison Study:  No prior studies. Performing Technologist: Oliver Hum RVT  Examination Guidelines: A complete evaluation includes B-mode imaging, spectral Doppler, color Doppler, and power Doppler as needed of all accessible portions of each vessel. Bilateral testing is considered an integral part of a complete examination. Limited examinations for reoccurring indications may be performed as noted.  Right Carotid Findings: +----------+--------+--------+--------+-----------------------+--------+             PSV cm/s EDV cm/s Stenosis Plaque Description      Comments  +----------+--------+--------+--------+-----------------------+--------+  CCA Prox   123      23                smooth  and heterogenous tortuous  +----------+--------+--------+--------+-----------------------+--------+  CCA Distal 58       17                smooth and heterogenous           +----------+--------+--------+--------+-----------------------+--------+  ICA Prox   50       9                 smooth and heterogenous           +----------+--------+--------+--------+-----------------------+--------+  ICA Distal 90       27                                        tortuous  +----------+--------+--------+--------+-----------------------+--------+  ECA        81       11                                                  +----------+--------+--------+--------+-----------------------+--------+ +----------+--------+-------+--------+-------------------+             PSV cm/s EDV cms Describe Arm Pressure (mmHG)  +----------+--------+-------+--------+-------------------+  Subclavian 106                                            +----------+--------+-------+--------+-------------------+ +---------+--------+--+--------+-+---------+  Vertebral PSV cm/s 52 EDV cm/s 8 Antegrade  +---------+--------+--+--------+-+---------+  Left Carotid Findings: +----------+--------+--------+--------+-----------------------+--------+             PSV cm/s EDV cm/s Stenosis Plaque Description      Comments  +----------+--------+--------+--------+-----------------------+--------+  CCA Prox   119      21                smooth and heterogenous tortuous  +----------+--------+--------+--------+-----------------------+--------+  CCA Distal 90       20                smooth and heterogenous           +----------+--------+--------+--------+-----------------------+--------+  ICA Prox   61       18                smooth and heterogenous           +----------+--------+--------+--------+-----------------------+--------+  ICA Distal 65       19                                        tortuous  +----------+--------+--------+--------+-----------------------+--------+  ECA         91       13                                                  +----------+--------+--------+--------+-----------------------+--------+ +----------+--------+--------+--------+-------------------+             PSV cm/s EDV cm/s Describe Arm Pressure (mmHG)  +----------+--------+--------+--------+-------------------+  Subclavian 139                                             +----------+--------+--------+--------+-------------------+ +---------+--------+--+--------+-+---------+  Vertebral PSV cm/s 42 EDV cm/s 9 Antegrade  +---------+--------+--+--------+-+---------+   Summary: Right Carotid: Velocities in the right ICA are consistent with a 1-39% stenosis. Left Carotid: Velocities in the left ICA are consistent with a 1-39% stenosis. Vertebrals: Bilateral vertebral arteries demonstrate antegrade flow. *See table(s) above for measurements and observations.  Electronically signed by Harold Barban MD on 08/27/2019 at 7:28:36 AM.    Final         Scheduled Meds:  aspirin  81 mg Oral Daily   cholecalciferol  1,000 Units Oral Daily   enoxaparin (LOVENOX) injection  40 mg Subcutaneous Q24H   feeding supplement (ENSURE ENLIVE)  237 mL Oral TID BM   levETIRAcetam  250 mg Oral BID   sodium chloride flush  3 mL Intravenous Q12H   Continuous Infusions:   LOS: 1 day        Hosie Poisson, MD [facility]   To contact the attending provider between 7A-7P or the covering provider during after hours 7P-7A, please log into the web site [email or web address] and access using universal [place] password for that web site. If you do not have the password, please call the hospital operator.  08/27/2019, 4:06 PM

## 2019-08-27 NOTE — Consult Note (Addendum)
Cardiology Consultation:   Patient ID: Shawna [facility]; [id number]; [birth year]   Admit date: 08/25/2019 Date of Consult: 08/27/2019  Primary Care Provider: London Pepper, Lin Primary Cardiologist: Shawna [facility]  Patient Profile:   Shawna Lin is a 86 y.o. female with a hx of advanced dementia, seizure disorder and hypertension who is being seen today for the evaluation of syncope of unknown etiology at the request of Shawna Lin.  History of Present Illness:   Shawna Lin is an 86 year old female with a history stated above who presented to [facility] on 08/25/2019 from the memory care unit at [facility] following a syncopal episode.  HPI obtained from chart review given patient's history of progressive dementia. Per chart review, patient has had recurrent syncopal episodes over the last several months which have always occured during or after meals. She has been seen in the ED on three separate occasions for similar symptoms.  During her most recent episode occurring 08/25/2019 the patient had just stopped eating when she became unresponsive. Her caregivers assisted her to the ground therefore there was no fall or injury. There was loss of consciousness. She denies dizziness, chest pain, palpitations or any prodromal symptoms however difficult to determine given patients hx of advance dementia. She is from [place] and is not married and has no children. Closest family member is a niece, Shawna [facility].    On ED presentation, ED provider spoke with family member who reported that the patient has recurrent episodes of loss of consciousness which has been occurring over the past month and has been seen in the [facility] multiple times for this.  She has been treated for possible seizures with Keppra.  She was last seen on 08/03/2019 in the emergency department after facility staff found the patient slumped over with her head on the table and was nonresponsive. Staff reported that the  episode lasted approximately 10 to 15 minutes and she awoke and appeared to be confused. Similar episode was reported 07/26/2019 with symptoms concerning for possible seizure.  She was started on Keppra 250 mg p.o. twice daily for seizure-like activity after case discussed with neurologist, [facility].  On this ED presentation, EKG showed NSR with prolonged QTC at 571ms and no other acute abnormalities.  Head CT obtained which was negative for acute intracranial abnormality.  Echocardiogram obtained which showed LVEF at 65 to 70% with G1 DD, mild to moderate aortic stenosis with a mean gradient of 10 mmHg.  EEG found to be negative for epileptiform activity confirmed by neurology.Carotid duplex to r/o carotid occlusion was negative with [id number] bilateral R/LICA.   Past Medical History:  Diagnosis Date  . Hypertension   . Memory loss   . Vitamin D deficiency     Past Surgical History:  Procedure Laterality Date  . UTERINE FIBROID SURGERY       Prior to Admission medications   Medication Sig Start Date End Date Taking? Authorizing Provider  acetaminophen (TYLENOL) 325 MG tablet Take 650 mg by mouth every 12 (twelve) hours as needed (knee pain).    Yes Shawna Lin  amLODipine (NORVASC) 2.5 MG tablet Take 2.5 mg by mouth daily. 12/12/17  Yes Shawna Lin  Aspirin 81 MG EC tablet Take 81 mg by mouth daily.   Yes Shawna Lin  cholecalciferol (VITAMIN D3) 25 MCG (1000 UNIT) tablet Take 1,000 Units by mouth daily.   Yes [facility]a Lin  donepezil (ARICEPT) 10 MG tablet TAKE ONE TABLET BY  MOUTH AT BEDTIME Patient taking differently: Take 10 mg by mouth at bedtime.  12/18/16  Yes Shawna Beam, Lin  Ensure (ENSURE) Take 237 mLs by mouth 3 (three) times daily.   Yes Shawna Lin  hydrochlorothiazide (MICROZIDE) 12.5 MG capsule Take 12.5 mg by mouth daily.    Yes Shawna Lin  levETIRAcetam (KEPPRA) 250 MG tablet Take 1 tablet (250  mg total) by mouth 2 (two) times daily. 08/03/19  Yes Shawna Reichert, Lin  memantine (NAMENDA) 10 MG tablet Take 1 tablet (10 mg total) by mouth 2 (two) times daily. 06/26/18  Yes Shawna Beam, Lin  OLANZapine (ZYPREXA) 2.5 MG tablet Take 2.5 mg by mouth at bedtime.   Yes Shawna Lin    Inpatient Medications: Scheduled Meds: . aspirin  81 mg Oral Daily  . cholecalciferol  1,000 Units Oral Daily  . enoxaparin (LOVENOX) injection  40 mg Subcutaneous Q24H  . feeding supplement (ENSURE ENLIVE)  237 mL Oral TID BM  . levETIRAcetam  250 mg Oral BID  . sodium chloride flush  3 mL Intravenous Q12H   Continuous Infusions:  PRN Meds: acetaminophen **OR** acetaminophen  Allergies:   No Known Allergies  Social History:   Social History   Socioeconomic History  . Marital status: Single    Spouse name: Not on file  . Number of children: 0  . Years of education: 75  . Highest education level: Not on file  Occupational History  . Occupation: Retired  Tobacco Use  . Smoking status: Never Smoker  . Smokeless tobacco: Never Used  Substance and Sexual Activity  . Alcohol use: No    Alcohol/week: 0.0 standard drinks  . Drug use: No  . Sexual activity: Not on file  Other Topics Concern  . Not on file  Social History Narrative   Lives alone, has a caregiver that lives closeby   Caffeine use: 2 cups coffee per day   Social Determinants of Health   Financial Resource Strain:   . Difficulty of Paying Living Expenses: Not on file  Food Insecurity:   . Worried About Charity fundraiser in the Last Year: Not on file  . Ran Out of Food in the Last Year: Not on file  Transportation Needs:   . Lack of Transportation (Medical): Not on file  . Lack of Transportation (Non-Medical): Not on file  Physical Activity:   . Days of Exercise per Week: Not on file  . Minutes of Exercise per Session: Not on file  Stress:   . Feeling of Stress : Not on file  Social Connections:   .  Frequency of Communication with Friends and Family: Not on file  . Frequency of Social Gatherings with Friends and Family: Not on file  . Attends Religious Services: Not on file  . Active Member of Clubs or Organizations: Not on file  . Attends Archivist Meetings: Not on file  . Marital Status: Not on file  Intimate Partner Violence:   . Fear of Current or Ex-Partner: Not on file  . Emotionally Abused: Not on file  . Physically Abused: Not on file  . Sexually Abused: Not on file    Family History:   Family History  Problem Relation Age of Onset  . Heart attack Father   . Hypertension Mother   . Colon cancer Brother   . Hypertension Brother   . Diabetes Sister   . Kidney failure Sister   . Hypertension Sister   .  Dementia Neg Hx    Family Status:  Family Status  Relation Name Status  . Father  Deceased at age 2  . Mother  Deceased at age 12  . Brother  (Not Specified)  . Brother  (Not Specified)  . Sister  (Not Specified)  . Sister  (Not Specified)  . Sister  (Not Specified)  . Neg Hx  (Not Specified)   Family history social history obtain from the chart as the patient has dementia and is unable to provide details   ROS:  Please see the history of present illness.  All other ROS reviewed and negative.     (Unable to clearly assess secondary to profound dementia)  Physical Exam/Data:   Vitals:   08/26/19 1614 08/26/19 2125 08/27/19 0500 08/27/19 0642  BP: (!) 121/55 (!) 131/57  (!) 164/88  Pulse: 68 66  66  Resp: 18 18  17   Temp: 98.3 F (36.8 C) 97.6 F (36.4 C)  97.9 F (36.6 C)  TempSrc: Oral Oral  Oral  SpO2: 100% 100%  96%  Weight:   75.3 kg     Intake/Output Summary (Last 24 hours) at 08/27/2019 0845 Last data filed at 08/27/2019 0801 Gross per 24 hour  Intake 840 ml  Output --  Net 840 ml   Filed Weights   08/26/19 0021 08/27/19 0500  Weight: 74.8 kg 75.3 kg   Body mass index is 29.41 kg/m.   General: Elderly, NAD Head:  Normocephalic, atraumatic, clear, moist mucus membranes. Neck: 1+ bilateral carotid bruits. No JVD Lungs:Clear to ausculation bilaterally. Breathing is unlabored. Cardiovascular: RRR with S1 S2. No murmurs Abdomen: Soft, non-tender, non-distended. No obvious abdominal masses. Extremities: No edema. DP pulses 2+ bilaterally Neuro: Alert and oriented to person and place. MAE spontaneously. Psych: Responds to questions somewhat appropriately with normal affect.    EKG:  The EKG was personally reviewed and demonstrates:  08/25/19 NSR with non-specific TWI in anterior leads with prolonged QTc at 582ms, HR 66bpm; 08/27/19 NSR with QTc improvement at 380ms and no TWI in anterior leads, HR 64bpm  Telemetry:  Telemetry was personally reviewed and demonstrates: 08/27/19 NSR with occasional PVCs, HR 70's   Relevant CV Studies:  Echocardiogram 08/26/19:  1. Left ventricular ejection fraction, by estimation, is 65 to 70%. The  left ventricle has normal function. The left ventrical has no regional  wall motion abnormalities. There is mildly increased left ventricular  hypertrophy. Left ventricular diastolic  parameters are consistent with Grade I diastolic dysfunction (impaired  relaxation). LV filling pressures not assessed.  2. Right ventricular systolic function was not well visualized. The right  ventricular size is not well visualized.  3. No evidence of mitral valve regurgitation.  4. The aortic valve has an indeterminant number of cusps. Aortic valve  regurgitation is not visualized. Mild to moderate aortic valve stenosis.  Aortic valve area, by VTI measures 1.44 cm. Aortic valve mean gradient  measures 10.0 mmHg. Aortic valve Vmax  measures 2.35 m/s.  5. The inferior vena cava is normal in size with greater than 50%  respiratory variability, suggesting right atrial pressure of 3 mmHg.   Carotid duplex 08/26/19:  Summary:  Right Carotid: Velocities in the right ICA are consistent  with a 1-39%  stenosis.   Left Carotid: Velocities in the left ICA are consistent with a 1-39%  stenosis.   Vertebrals: Bilateral vertebral arteries demonstrate antegrade flow.  Laboratory Data:  Chemistry Recent Labs  Lab 08/25/19 1857 08/26/19 0457  NA  144 143  K 3.5 3.5  3.5  CL 104 107  CO2 28 27  GLUCOSE 174* 82  BUN 22 20  CREATININE 1.29* 1.04*  CALCIUM 9.7 9.7  GFRNONAA 37* 48*  GFRAA 43* 56*  ANIONGAP 12 9    Total Protein  Date Value Ref Range Status  08/25/2019 7.2 6.5 - 8.1 g/dL Final   Albumin  Date Value Ref Range Status  08/25/2019 4.2 3.5 - 5.0 g/dL Final   AST  Date Value Ref Range Status  08/25/2019 26 15 - 41 U/L Final   ALT  Date Value Ref Range Status  08/25/2019 14 0 - 44 U/L Final   Alkaline Phosphatase  Date Value Ref Range Status  08/25/2019 109 38 - 126 U/L Final   Total Bilirubin  Date Value Ref Range Status  08/25/2019 0.5 0.3 - 1.2 mg/dL Final   Hematology Recent Labs  Lab 08/25/19 1857 08/26/19 0457 08/27/19 0824  WBC 6.3 6.1 4.4  RBC 3.88 3.72* 3.66*  HGB 9.7* 9.5* 9.4*  HCT 33.3* 31.7* 31.5*  MCV 85.8 85.2 86.1  MCH 25.0* 25.5* 25.7*  MCHC 29.1* 30.0 29.8*  RDW 19.4* 19.4* 19.4*  PLT 221 183 167   Cardiac EnzymesNo results for input(s): TROPONINI in the last 168 hours. No results for input(s): TROPIPOC in the last 168 hours.  BNPNo results for input(s): BNP, PROBNP in the last 168 hours.  DDimer No results for input(s): DDIMER in the last 168 hours. TSH: No results found for: TSH Lipids:No results found for: CHOL, HDL, LDLCALC, LDLDIRECT, TRIG, CHOLHDL HgbA1c: Lab Results  Component Value Date   HGBA1C 5.8 (H) 12/22/2015    Radiology/Studies:  DG Chest 2 View  Result Date: 08/25/2019 CLINICAL DATA:  Syncope EXAM: CHEST - 2 VIEW COMPARISON:  11/02/2017 FINDINGS: Frontal and lateral views of the chest demonstrate a stable cardiac silhouette. The patient is rotated toward the right, which accentuates the  thoracic aortic ectasia seen previously. No airspace disease, effusion, or pneumothorax. IMPRESSION: 1. No acute intrathoracic process. Electronically Signed   By: Randa Ngo M.D.   On: 08/25/2019 21:54   CT Head Wo Contrast  Result Date: 08/25/2019 CLINICAL DATA:  86 year old female with encephalopathy. EXAM: CT HEAD WITHOUT CONTRAST TECHNIQUE: Contiguous axial images were obtained from the base of the skull through the vertex without intravenous contrast. COMPARISON:  Head CT dated 07/26/2019. FINDINGS: Brain: There is mild age-related atrophy and chronic microvascular ischemic changes. There is no acute intracranial hemorrhage. No mass effect or midline shift. No extra-axial fluid collection. Vascular: No hyperdense vessel or unexpected calcification. Skull: No acute calvarial pathology. There is mottled appearance of the skull with diffuse tiny lucencies with salt and Lin appearance of the calvarium which can be seen in the setting of hyperthyroidism. Clinical correlation is recommended. Sinuses/Orbits: Left maxillary sinus retention cyst or polyp. No air-fluid level. The mastoid air cells are clear. Other: None IMPRESSION: 1. No acute intracranial pathology. 2. Mild age-related atrophy and chronic microvascular ischemic changes. Electronically Signed   By: Anner Crete M.D.   On: 08/25/2019 19:25   EEG adult  Result Date: 08/26/2019 Lora Havens, Lin     08/26/2019  2:57 PM Patient Name: Shawna Lin MRN: [id number] Epilepsy Attending: Lora Havens Referring Physician/Provider: Dr. Shela Leff Date: 08/26/2019 Duration: 22.54 minutes Patient history: 86 year old female who presented with recurrent syncope.  EEG to evaluate for seizures. Level of alertness: Awake AEDs during EEG study: Keppra Technical aspects: This EEG study  was done with scalp electrodes positioned according to the 10-20 International system of electrode placement. Electrical activity was acquired at a sampling  rate of 500Hz  and reviewed with a high frequency filter of 70Hz  and a low frequency filter of 1Hz . EEG data were recorded continuously and digitally stored. Description: During awake state, no clear posterior dominant rhythm was seen.  EEG showed 15 to 18 Hz patient beta activity with irregular morphology distributed symmetrically and diffusely.  Physiologic photic driving was seen during photic stimulation.  Hyperventilation was not performed. Abnormality -Beta activity, generalized IMPRESSION: This study is within normal limits. The beta activity seen in the background is a benign EEG pattern which can be seen in patients with benzodiazepine use. No seizures or epileptiform discharges were seen throughout the recording. Lora Havens   ECHOCARDIOGRAM COMPLETE  Result Date: 08/26/2019    ECHOCARDIOGRAM REPORT   Patient Name:   Shawna Lin Date of Exam: 08/26/2019 Medical Rec #:  [id number]     Height:       63.0 in Accession #:    [id number]    Weight:       165.0 lb Date of Birth:  1928-07-19      BSA:          1.78 m Patient Age:    53 years      BP:           155/63 mmHg Patient Gender: F             HR:           67 bpm. Exam Location:  Inpatient Procedure: 2D Echo, Color Doppler and Cardiac Doppler Indications:    R55 Syncope  History:        Patient has prior history of Echocardiogram examinations, most                 recent 12/22/2015. Signs/Symptoms:Syncope; Risk                 Factors:Hypertension.  Sonographer:    Raquel Sarna Senior RDCS Referring Phys: [id number] Foster  1. Left ventricular ejection fraction, by estimation, is 65 to 70%. The left ventricle has normal function. The left ventrical has no regional wall motion abnormalities. There is mildly increased left ventricular hypertrophy. Left ventricular diastolic parameters are consistent with Grade I diastolic dysfunction (impaired relaxation). LV filling pressures not assessed.  2. Right ventricular systolic function was not  well visualized. The right ventricular size is not well visualized.  3. No evidence of mitral valve regurgitation.  4. The aortic valve has an indeterminant number of cusps. Aortic valve regurgitation is not visualized. Mild to moderate aortic valve stenosis. Aortic valve area, by VTI measures 1.44 cm. Aortic valve mean gradient measures 10.0 mmHg. Aortic valve Vmax  measures 2.35 m/s.  5. The inferior vena cava is normal in size with greater than 50% respiratory variability, suggesting right atrial pressure of 3 mmHg. FINDINGS  Left Ventricle: Left ventricular ejection fraction, by estimation, is 65 to 70%. The left ventricle has normal function. The left ventricle has no regional wall motion abnormalities. The left ventricular internal cavity size was normal in size. There is  mildly increased left ventricular hypertrophy. Concentric left ventricular hypertrophy. Left ventricular diastolic parameters are consistent with Grade I diastolic dysfunction (impaired relaxation). Right Ventricle: The right ventricular size is not well visualized. Right vetricular wall thickness was not assessed. Right ventricular systolic function was not well visualized. Left Atrium: Left atrial size  was normal in size. Right Atrium: Right atrial size was normal in size. Pericardium: There is no evidence of pericardial effusion. Mitral Valve: The mitral valve is normal in structure and function. Normal mobility of the mitral valve leaflets. No evidence of mitral valve regurgitation. No evidence of mitral valve stenosis. Tricuspid Valve: The tricuspid valve is normal in structure. Tricuspid valve regurgitation is not demonstrated. No evidence of tricuspid stenosis. Aortic Valve: The aortic valve has an indeterminant number of cusps. . There is moderate thickening and severe calcifcation of the aortic valve. Aortic valve regurgitation is not visualized. Mild to moderate aortic stenosis is present. There is moderate thickening of the  aortic valve. There is severe calcifcation of the aortic valve. Aortic valve mean gradient measures 10.0 mmHg. Aortic valve peak gradient measures 22.1 mmHg. Aortic valve area, by VTI measures 1.44 cm. Pulmonic Valve: The pulmonic valve was normal in structure. Pulmonic valve regurgitation is not visualized. No evidence of pulmonic stenosis. Aorta: The aortic root is normal in size and structure. Venous: The inferior vena cava is normal in size with greater than 50% respiratory variability, suggesting right atrial pressure of 3 mmHg. The inferior vena cava and the hepatic vein show a normal flow pattern. IAS/Shunts: No atrial level shunt detected by color flow Doppler.  LEFT VENTRICLE PLAX 2D LVIDd:         2.70 cm LVIDs:         1.50 cm LV PW:         1.20 cm LV IVS:        1.20 cm LVOT diam:     1.80 cm LV SV:         57.00 ml LV SV Index:   11.34 LVOT Area:     2.54 cm  LEFT ATRIUM           Index LA diam:      2.90 cm 1.63 cm/m LA Vol (A2C): 23.4 ml 13.13 ml/m LA Vol (A4C): 32.3 ml 18.13 ml/m  AORTIC VALVE AV Area (Vmax):    1.15 cm AV Area (Vmean):   1.46 cm AV Area (VTI):     1.44 cm AV Vmax:           235.00 cm/s AV Vmean:          146.000 cm/s AV VTI:            0.396 m AV Peak Grad:      22.1 mmHg AV Mean Grad:      10.0 mmHg LVOT Vmax:         106.00 cm/s LVOT Vmean:        83.900 cm/s LVOT VTI:          0.224 m LVOT/AV VTI ratio: 0.57  AORTA Ao Root diam: 2.40 cm Ao Asc diam:  2.80 cm MITRAL VALVE MV Area (PHT): 2.71 cm              SHUNTS MV Decel Time: 280 msec              Systemic VTI:  0.22 m MV E velocity: 72.80 cm/s  103 cm/s  Systemic Diam: 1.80 cm MV A velocity: 113.00 cm/s 70.3 cm/s MV E/A ratio:  0.64        1.5 Fransico Him Lin Electronically signed by Fransico Him Lin Signature Date/Time: 08/26/2019/9:52:27 AM    Final    VAS US CAROTID  Result Date: 08/27/2019 Carotid Arterial Duplex Study Indications:  Syncope. Risk Factors:      Hypertension. Comparison Study:  No prior  studies. Performing Technologist: Oliver Hum RVT  Examination Guidelines: A complete evaluation includes B-mode imaging, spectral Doppler, color Doppler, and power Doppler as needed of all accessible portions of each vessel. Bilateral testing is considered an integral part of a complete examination. Limited examinations for reoccurring indications may be performed as noted.  Right Carotid Findings: +----------+--------+--------+--------+-----------------------+--------+           PSV cm/sEDV cm/sStenosisPlaque Description     Comments +----------+--------+--------+--------+-----------------------+--------+ CCA Prox  123     23              smooth and heterogenoustortuous +----------+--------+--------+--------+-----------------------+--------+ CCA Distal58      17              smooth and heterogenous         +----------+--------+--------+--------+-----------------------+--------+ ICA Prox  50      9               smooth and heterogenous         +----------+--------+--------+--------+-----------------------+--------+ ICA Distal90      27                                     tortuous +----------+--------+--------+--------+-----------------------+--------+ ECA       81      11                                              +----------+--------+--------+--------+-----------------------+--------+ +----------+--------+-------+--------+-------------------+           PSV cm/sEDV cmsDescribeArm Pressure (mmHG) +----------+--------+-------+--------+-------------------+ [id number]                                        +----------+--------+-------+--------+-------------------+ +---------+--------+--+--------+-+---------+ VertebralPSV cm/s52EDV cm/s8Antegrade +---------+--------+--+--------+-+---------+  Left Carotid Findings: +----------+--------+--------+--------+-----------------------+--------+           PSV cm/sEDV cm/sStenosisPlaque Description      Comments +----------+--------+--------+--------+-----------------------+--------+ CCA Prox  119     21              smooth and heterogenoustortuous +----------+--------+--------+--------+-----------------------+--------+ CCA Distal90      20              smooth and heterogenous         +----------+--------+--------+--------+-----------------------+--------+ ICA Prox  61      18              smooth and heterogenous         +----------+--------+--------+--------+-----------------------+--------+ ICA Distal65      19                                     tortuous +----------+--------+--------+--------+-----------------------+--------+ ECA       91      13                                              +----------+--------+--------+--------+-----------------------+--------+ +----------+--------+--------+--------+-------------------+           PSV cm/sEDV cm/sDescribeArm Pressure (mmHG) +----------+--------+--------+--------+-------------------+ [id number]                                         +----------+--------+--------+--------+-------------------+ +---------+--------+--+--------+-+---------+  VertebralPSV cm/s42EDV cm/s9Antegrade +---------+--------+--+--------+-+---------+   Summary: Right Carotid: Velocities in the right ICA are consistent with a 1-39% stenosis. Left Carotid: Velocities in the left ICA are consistent with a 1-39% stenosis. Vertebrals: Bilateral vertebral arteries demonstrate antegrade flow. *See table(s) above for measurements and observations.  Electronically signed by Harold Barban Lin on 08/27/2019 at 7:28:36 AM.    Final    Assessment and Plan:   1.  Recurrent syncope with unclear etiology: -Patient presented from the memory care unit at [facility] facility at which time it was noted that the patient had a syncopal episode with loss of consciousness which apparently has occurred multiple times over the last several  weeks. She has been seen in the ED on three separate occasions for similar symptoms with no clear syncope etiology. On this occasion, EKG without arrhythmia or acute changes. QTc was found to be prolonged on arrival however patient has been on Aricept and Namenda which has since been discontinued.  QTc has improved since this time from 55ms to 374ms.  Echocardiogram performed with an LVEF at 65 to 70% with G1 DD and mild to moderate aortic valve stenosis and no other structural abnormalities.  EEG performed with no epileptiform activity. Carotid duplex study currently with [id number] RICA and [id number] LICA -Telemetry review revealed no arrhthymias with NSR and occasional PVCs -Would plan for cardiac monitoring at discharge to further evaluate for possible arrhythmia however given no clear structural abnormality, arrhythmia on cardiac tracing or abnormal electrolytes, syncopal episodes may be in the setting of vasovagal events versus uncontrolled seizures? -Orthostatics currently being performed with results pending. If positive, would recommend lower extremity compression stockings at Lin discharge  2.  Prolonged QTC on EKG: -Patient on home Zyprexa, Aricept as well as Namenda which could be contributing -Continue to monitor on telemetry -Keep K+ greater than 4.0 and Mg+ greater than 2.0 -Qtc on initial EKG at 581ms with repeat EKG this AM with improvement at 334ms.  -No arrhythmia per telemetry review>>occasional PVC  3.  History of seizure disorder: -Continue home Keppra, primary team checking Keppra levels -EEG for further evaluation>>> per neurology note, study was within normal limits with no seizure or epileptiform discharges seen throughout the recording  4.  Dementia: -Has progressive dementia, resides in memory care unit at [facility] currently on hold secondary to #2  5.  Hypertension: -BPs have been variable in range, 164/88, 131/57,  116/55, 98/71 -Antihypertensive currently on hold at this time    For questions or updates, please contact [place] Please consult [email or web address] for contact info under Cardiology/STEMI.   Lyndel Safe NP-C [facility] Pager: [phone] 08/27/2019 8:45 AM  History and all data above reviewed.  Patient examined.  I agree with the findings as above.  The patient has very confused.  She thinks she still lives in [place].  He does not recall any events.  However, she does not have any complaints that she recalls such as chest discomfort, shortness of breath, palpitations, presyncope or syncope.  The patient exam reveals COR: Regular rate and rhythm, 3 out of 6 brief systolic murmur radiating up the aortic outflow tract, no diastolic,  Lungs: Clear without wheezing or crackles,  Abd: Positive bowel sounds no rebound or guarding, Ext 2+ pulses, dorsalis pedis and posterior tibialis diminished.  All available labs, radiology testing, previous records reviewed. Agree with documented assessment and plan.  Syncope: The etiology of this does not appear to be  cardiac.  I would not suspect that the aortic stenosis is severe.  I reviewed the imaging from previous occurrence Lin visits.  There is no clear neurologic etiology.  She had normal carotids.  Echocardiogram demonstrates AS but no other significant abnormalities.  EKG does not demonstrate any conduction disturbances.  At this point I would suggest outpatient monitoring perhaps for a month to see if we can capture 1 of these events though I am not strongly suspecting a dysrhythmia.  No change in therapy.  Jeneen Rinks Kynesha Guerin  1:49 PM  08/27/2019

## 2019-08-27 NOTE — Evaluation (Signed)
Physical Therapy Evaluation Patient Details Name: Shawna Lin MRN: [id number] DOB: 10-Aug-1928 Today's Date: 08/27/2019   History of Present Illness  Pt is an 86 yo female admitted from ALF with syncopal episode where she was found slumped over eating and was lowered to the floor. No fall.  Pt has had several of these eposides lately some requiring hospitalization.  Pt on Kepra for possible seizures.  Tests so far negative and pt did not show signs of orthostatic hypotention on eval.      Clinical Impression  Shawna Lin is 86 y.o. female admitted with above HPI and diagnosis. Patient is currently limited by functional impairments below (see PT problem list). Patient lives at [facility] and reports independence RW for mobility at baseline. Patient denies falls in the last 6 months and states these episodes have not happened while she is walking. No signs of hypotension noted during gait. Patient will benefit from continued skilled PT interventions to address impairments and progress independence with mobility, recommending HHPT follow up and return home to ALF. Acute PT will follow and progress as able.     Follow Up Recommendations Home health PT    Equipment Recommendations  None recommended by PT    Recommendations for Other Services       Precautions / Restrictions Precautions Precautions: Fall Restrictions Weight Bearing Restrictions: No      Mobility  Bed Mobility Overal bed mobility: Modified Independent        General bed mobility comments: used bedrail to get to EOB and slow to mobilize  Transfers Overall transfer level: Needs assistance Equipment used: Rolling walker (2 wheeled) Transfers: Sit to/from [facility] Sit to Stand: Min guard Stand pivot transfers: Supervision       General transfer comment: cues for safe hand placement with RW, and min guard for safety with power up  Ambulation/Gait Ambulation/Gait assistance: Min guard Gait  Distance (Feet): 160 Feet Assistive device: Rolling walker (2 wheeled) Gait Pattern/deviations: Step-through pattern;Decreased stride length;Trunk flexed;Drifts right/left Gait velocity: decreased   General Gait Details: pt wtih good hand placement on RW, cues required for safe proximity and pt with tendency to extend walker ahead of herself. no ovet LOB, cues required for posture and for negotiating obstacles in hallway (chairs, doorways).  Stairs            Wheelchair Mobility    Modified Rankin (Stroke Patients Only)       Balance Overall balance assessment: Needs assistance Sitting-balance support: Feet supported Sitting balance-Leahy Scale: Good     Standing balance support: Bilateral upper extremity supported;During functional activity Standing balance-Leahy Scale: Fair Standing balance comment: UE support required for dynamic standing and gait             Pertinent Vitals/Pain Pain Assessment: No/denies pain    Home Living Family/patient expects to be discharged to:: Assisted living Living Arrangements: Other (Comment)(staff assists PRN)             Home Equipment: Walker - 2 wheels;Shower seat - built in      Prior Function Level of Independence: Independent with assistive device(s)         Comments: pt reports use of RW for mobility (transfers/gait) and reports she walks in hallways for exercise. she reports independence with ADL's (pt slightly confused while providing PLOF and stating she cooks and cleans her own home). she is able to correct this when redirected that she lives in her apartment at [place] and not in a house.  Hand Dominance   Dominant Hand: Right    Extremity/Trunk Assessment   Upper Extremity Assessment Upper Extremity Assessment: Defer to OT evaluation    Lower Extremity Assessment Lower Extremity Assessment: Generalized weakness    Cervical / Trunk Assessment Cervical / Trunk Assessment: Normal   Communication   Communication: No difficulties  Cognition Arousal/Alertness: Awake/alert Behavior During Therapy: WFL for tasks assessed/performed Overall Cognitive Status: History of cognitive impairments - at baseline          General Comments: pt with known memory deficits and disorietned to place, time, situation; able to be re-oriented.      General Comments General comments (skin integrity, edema, etc.): pt overall very close to baseline with adls. pt should be able to d/c back to ALF .  Obvious concern for falls when pt has these syncopal episodes but otherwise fairly safe with mobility and adls.     Exercises     Assessment/Plan    PT Assessment Patient needs continued PT services  PT Problem List Decreased strength;Decreased balance;Decreased mobility;Decreased activity tolerance       PT Treatment Interventions DME instruction;Functional mobility training;Balance training;Patient/family education;Therapeutic activities;Gait training;Stair training;Therapeutic exercise    PT Goals (Current goals can be found in the Care Plan section)  Acute Rehab PT Goals Patient Stated Goal: to find out why these episodes happen PT Goal Formulation: With patient Time For Goal Achievement: 09/10/19 Potential to Achieve Goals: Good    Frequency Min 2X/week    AM-PAC PT "6 Clicks" Mobility  Outcome Measure Help needed turning from your back to your side while in a flat bed without using bedrails?: None Help needed moving from lying on your back to sitting on the side of a flat bed without using bedrails?: None Help needed moving to and from a bed to a chair (including a wheelchair)?: A Little Help needed standing up from a chair using your arms (e.g., wheelchair or bedside chair)?: A Little Help needed to walk in hospital room?: A Little Help needed climbing 3-5 steps with a railing? : A Little 6 Click Score: 20    End of Session Equipment Utilized During Treatment: Gait  belt Activity Tolerance: Patient tolerated treatment well Patient left: with call bell/phone within reach;in chair;with chair alarm set;with nursing/sitter in room Nurse Communication: Mobility status PT Visit Diagnosis: Muscle weakness (generalized) (M62.81);Difficulty in walking, not elsewhere classified (R26.2);Unsteadiness on feet (R26.81)    Time: [id number] PT Time Calculation (min) (ACUTE ONLY): 23 min   Charges:   PT Evaluation $PT Eval Low Complexity: 1 Low PT Treatments $Gait Training: 8-22 mins        Verner Mould, DPT Physical Therapist with [facility] [phone]  08/27/2019 1:56 PM

## 2019-08-27 NOTE — Evaluation (Signed)
Occupational Therapy Evaluation Patient Details Name: Shawna Lin MRN: [id number] DOB: [birth year] Today's Date: 08/27/2019    History of Present Illness Pt is an 86 yo female admitted from ALF with syncopal episode where she was found slumped over eating and was lowered to the floor. No fall.  Pt has had several of these eposides lately some requiring hospitalization.  Pt on Kepra for possible seizures.  Tests so far negative and pt did not show signs of orthostatic hypotention on eval.     Clinical Impression   Pt admitted with the above diagnosis and has the deficits outlined below. Feel pt is at or close to baseline adl status which is overall modified independence with use of adaptive equipment and eating meals in dining room but could continue with acute OT to get to ensure modified independent level of care for her return to assisted living.  Pt prides herself on being independent.  Concern, of course, for falls during these syncopal episodes but overall mobilizes well and completes adls with little to no assistance.  Pt most limited with STM and orientation.      Follow Up Recommendations  Supervision/Assistance - 24 hour;Home health OT    Equipment Recommendations  None recommended by OT    Recommendations for Other Services       Precautions / Restrictions Precautions Precautions: Fall Restrictions Weight Bearing Restrictions: No      Mobility Bed Mobility Overal bed mobility: Modified Independent             General bed mobility comments: used bedrail to get to EOB  Transfers Overall transfer level: Needs assistance Equipment used: Rolling walker (2 wheeled) Transfers: Sit to/from [facility] Sit to Stand: Supervision Stand pivot transfers: Supervision       General transfer comment: cuesf or hand placement on walker only    Balance Overall balance assessment: Needs assistance Sitting-balance support: Feet supported Sitting balance-Leahy  Scale: Good     Standing balance support: Bilateral upper extremity supported;During functional activity Standing balance-Leahy Scale: Fair Standing balance comment: Pt can let go of walker at sink to groom with no LOB.  Pt usually uses walker at home when on feet.                           ADL either performed or assessed with clinical judgement   ADL Overall ADL's : Needs assistance/impaired Eating/Feeding: Set up;Sitting   Grooming: Wash/dry hands;Wash/dry face;Oral care;Supervision/safety;Standing;Cueing for compensatory techniques Grooming Details (indicate cue type and reason): occasional cues to keep walker in front of her Upper Body Bathing: Set up;Sitting   Lower Body Bathing: Min guard;Sit to/from stand;Cueing for compensatory techniques Lower Body Bathing Details (indicate cue type and reason): sits on EOB to donn socks and pulls legs up onto the bed. Upper Body Dressing : Set up;Sitting   Lower Body Dressing: Min guard;Sit to/from stand;Cueing for compensatory techniques   Toilet Transfer: Min guard;Ambulation;Comfort height toilet;Grab bars;RW Toilet Transfer Details (indicate cue type and reason): pt walked to bathroom with walker with no LOB. Toileting- Clothing Manipulation and Hygiene: Supervision/safety;Sitting/lateral lean       Functional mobility during ADLs: Min guard;Rolling walker General ADL Comments: Pt does very well with adls and prides herself on being independent.  Feel she is at or very close to baseline.  Will continue to follow to maintain her independence while here in the hospital.     Vision Baseline Vision/History: Wears glasses Wears Glasses:  At all times Patient Visual Report: No change from baseline Vision Assessment?: No apparent visual deficits     Perception     Praxis Praxis Praxis tested?: Within functional limits    Pertinent Vitals/Pain Pain Assessment: No/denies pain     Hand Dominance Right    Extremity/Trunk Assessment Upper Extremity Assessment Upper Extremity Assessment: Overall WFL for tasks assessed   Lower Extremity Assessment Lower Extremity Assessment: Defer to PT evaluation   Cervical / Trunk Assessment Cervical / Trunk Assessment: Normal   Communication Communication Communication: No difficulties   Cognition Arousal/Alertness: Awake/alert Behavior During Therapy: WFL for tasks assessed/performed Overall Cognitive Status: History of cognitive impairments - at baseline                                 General Comments: Pt with memory deficits as well as orientation but has  good grasp on attempting to be safe when on her feet.   General Comments  pt overall very close to baseline with adls. pt should be able to d/c back to ALF .  Obvious concern for falls when pt has these syncopal episodes but otherwise fairly safe with mobility and adls.     Exercises     Shoulder Instructions      Home Living Family/patient expects to be discharged to:: Assisted living Living Arrangements: Other (Comment)(staff assists if needed)                           Home Equipment: Walker - 2 wheels;Shower seat - built in          Prior Functioning/Environment Level of Independence: Independent with assistive device(s)        Comments: Pt states she is independent with basic adls and goes to dining room to eat and walks with walker independently.        OT Problem List: Impaired balance (sitting and/or standing);Decreased cognition;Decreased knowledge of use of DME or AE      OT Treatment/Interventions: Self-care/ADL training;Therapeutic activities;DME and/or AE instruction    OT Goals(Current goals can be found in the care plan section) Acute Rehab OT Goals Patient Stated Goal: to find out why these episodes happen OT Goal Formulation: With patient Time For Goal Achievement: 09/10/19 Potential to Achieve Goals: Good ADL Goals Pt Will  Perform Grooming: with modified independence;standing Pt Will Perform Tub/Shower Transfer: Shower transfer;ambulating;rolling walker;shower seat;with modified independence Additional ADL Goal #1: Pt will walk to bathroom with walker and complete all toileting tasks with grab rail and walker with mod I. Additional ADL Goal #2: Pt will fully dress self UE/LE gathering all clothes with mod I.  OT Frequency: Min 2X/week   Barriers to D/C:            Co-evaluation              AM-PAC OT "6 Clicks" Daily Activity     Outcome Measure Help from another person eating meals?: None Help from another person taking care of personal grooming?: None Help from another person toileting, which includes using toliet, bedpan, or urinal?: A Little Help from another person bathing (including washing, rinsing, drying)?: A Little Help from another person to put on and taking off regular upper body clothing?: None Help from another person to put on and taking off regular lower body clothing?: A Little 6 Click Score: 21   End of Session Equipment Utilized  During Treatment: Rolling walker Nurse Communication: Mobility status  Activity Tolerance: Patient tolerated treatment well Patient left: in bed;with call bell/phone within reach;with nursing/sitter in room  OT Visit Diagnosis: Unsteadiness on feet (R26.81)                Time: 1032-1100 OT Time Calculation (min): 28 min Charges:  OT General Charges $OT Visit: 1 Visit OT Evaluation $OT Eval Low Complexity: 1 Low OT Treatments $Self Care/Home Management : 8-22 mins  Glenford Peers 08/27/2019, 11:12 AM

## 2019-08-27 NOTE — NC FL2 (Signed)
  [place] LEVEL OF CARE SCREENING TOOL     IDENTIFICATION  Patient Name: Aalycia Cluney Birthdate: Aug 29, 1928 Sex: female Admission Date (Current Location): 08/25/2019  [facility] and [place] Number:  Herbalist and Address:  [facility] [facility] [address],  [place] [address], [place]      Provider Number: [id number]  Attending Physician Name and Address:  Hosie Poisson, MD  Relative Name and Phone Number:       Current Level of Care: Hospital Recommended Level of Care: Memory Care Prior Approval Number:    Date Approved/Denied:   PASRR Number:    Discharge Plan: Domiciliary (Rest home)([facility] [facility])    Current Diagnoses: Patient Active Problem List   Diagnosis Date Noted  . Syncope 08/26/2019  . Recurrent syncope 08/25/2019  . QT prolongation 08/25/2019  . Seizure disorder ([place]) 08/25/2019  . Pain due to onychomycosis of toenails of both feet 01/07/2019  . Dementia ([place]) 12/21/2015  . No blood products 12/21/2015  . 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 Memory loss 09/02/2015  . HTN (hypertension) 09/02/2015    Orientation RESPIRATION BLADDER Height & Weight     Self, Place  Normal Continent Weight: 75.3 kg Height:     BEHAVIORAL SYMPTOMS/MOOD NEUROLOGICAL BOWEL NUTRITION STATUS  (none) (none) Continent Diet(see d/c report)  AMBULATORY STATUS COMMUNICATION OF NEEDS Skin   Limited Assist Verbally Normal                       Personal Care Assistance Level of Assistance  Bathing, Feeding, Dressing Bathing Assistance: Limited assistance Feeding assistance: Independent Dressing Assistance: Limited assistance     Functional Limitations Info  Sight, Hearing, Speech Sight Info: Adequate Hearing Info: Adequate Speech Info: Adequate    SPECIAL CARE FACTORS FREQUENCY  PT (By licensed PT)     PT Frequency: 2X/W              Contractures Contractures Info: Not present    Additional Factors Info  Code  Status, Allergies Code Status Info: full Allergies Info: NKA           Current Medications (08/27/2019):  This is the current hospital active medication list    Discharge Medications: acetaminophen 325 MG tablet Commonly known as: TYLENOL Take 650 mg by mouth every 12 (twelve) hours as needed (knee pain).         amLODipine 2.5 MG tablet Commonly known as: NORVASC Take 2.5 mg by mouth daily.         Aspirin 81 MG EC tablet Take 81 mg by mouth daily.         cholecalciferol 25 MCG (1000 UNIT) tablet Commonly known as: VITAMIN D3 Take 1,000 Units by mouth daily.         Ensure Take 237 mLs by mouth 3 (three) times daily.         levETIRAcetam 250 MG tablet Commonly known as: Keppra Take 1 tablet (250 mg total) by mouth 2 (two) times daily.         OLANZapine 2.5 MG tablet Commonly known as: ZYPREXA Take 2.5 mg by mouth at bedtime.            Relevant Imaging Results:  Relevant Lab Results:   Additional Independence, LCSW

## 2019-08-28 DIAGNOSIS — I10 Essential (primary) hypertension: Secondary | ICD-10-CM

## 2019-08-28 LAB — GLUCOSE, CAPILLARY: Glucose-Capillary: 78 mg/dL (ref 70–99)

## 2019-08-28 LAB — LEVETIRACETAM LEVEL: Levetiracetam Lvl: 15.5 ug/mL (ref 10.0–40.0)

## 2019-08-28 NOTE — Care Management Important Message (Signed)
Important Message  Patient Details IM Letter given to [name] SW Case Manager to present to the Patient Name: Shawna Lin MRN: [id number] Date of Birth: 05-23-1929   Medicare Important Message Given:  Yes     [name] 08/28/2019, 11:12 AM

## 2019-08-28 NOTE — Progress Notes (Addendum)
Progress Note  Patient Name: Shawna Lin Date of Encounter: 08/28/2019  Primary Cardiologist: New to [place] pleasantly confused this AM. No complaints. Telemetry with no arrhythmias. Infrequent PVCs  Inpatient Medications    Scheduled Meds: . aspirin  81 mg Oral Daily  . cholecalciferol  1,000 Units Oral Daily  . enoxaparin (LOVENOX) injection  40 mg Subcutaneous Q24H  . feeding supplement (ENSURE ENLIVE)  237 mL Oral TID BM  . levETIRAcetam  250 mg Oral BID  . sodium chloride flush  3 mL Intravenous Q12H   Continuous Infusions:  PRN Meds: acetaminophen **OR** acetaminophen   Vital Signs    Vitals:   08/27/19 1550 08/27/19 2039 08/28/19 0500 08/28/19 0523  BP: 136/78 122/67  (!) 141/68  Pulse: 66 (!) 55  71  Resp: 18 14  18   Temp: 98.2 F (36.8 C) 97.6 F (36.4 C)  98.5 F (36.9 C)  TempSrc: Oral Axillary  Oral  SpO2: 100% 97%  100%  Weight:   77.6 kg     Intake/Output Summary (Last 24 hours) at 08/28/2019 0720 Last data filed at 08/27/2019 1735 Gross per 24 hour  Intake 720 ml  Output --  Net 720 ml   Filed Weights   08/26/19 0021 08/27/19 0500 08/28/19 0500  Weight: 74.8 kg 75.3 kg 77.6 kg    Physical Exam   General: Elderly, NAD Neck: Negative for carotid bruits. No JVD Lungs:Clear to ausculation bilaterally. Breathing is unlabored. Cardiovascular: RRR with S1 S2. No murmurs.  Abdomen: Soft, non-tender, non-distended. No obvious abdominal masses. Extremities: No edema. DP pulses 2+ bilaterally Neuro: Alert and oriented to person only. MAE spontaneously. Psych: Responds to questions somewhat appropriately with normal affect.    Labs    Chemistry Recent Labs  Lab 08/25/19 1857 08/26/19 0457 08/27/19 0824  NA 144 143 142  K 3.5 3.5  3.5 3.7  CL 104 107 109  CO2 28 27 26   GLUCOSE 174* 82 131*  BUN 22 20 20   CREATININE 1.29* 1.04* 1.06*  CALCIUM 9.7 9.7 9.6  PROT 7.2  --   --   ALBUMIN 4.2  --   --   AST 26   --   --   ALT 14  --   --   ALKPHOS 109  --   --   BILITOT 0.5  --   --   GFRNONAA 37* 48* 47*  GFRAA 43* 56* 54*  ANIONGAP 12 9 7      Hematology Recent Labs  Lab 08/25/19 1857 08/26/19 0457 08/27/19 0824  WBC 6.3 6.1 4.4  RBC 3.88 3.72* 3.66*  HGB 9.7* 9.5* 9.4*  HCT 33.3* 31.7* 31.5*  MCV 85.8 85.2 86.1  MCH 25.0* 25.5* 25.7*  MCHC 29.1* 30.0 29.8*  RDW 19.4* 19.4* 19.4*  PLT 221 183 167    Cardiac EnzymesNo results for input(s): TROPONINI in the last 168 hours. No results for input(s): TROPIPOC in the last 168 hours.   BNPNo results for input(s): BNP, PROBNP in the last 168 hours.   DDimer No results for input(s): DDIMER in the last 168 hours.   Radiology    MR BRAIN WO CONTRAST  Result Date: 08/27/2019 CLINICAL DATA:  86 year old female with encephalopathy. EXAM: MRI HEAD WITHOUT CONTRAST TECHNIQUE: Multiplanar, multiecho pulse sequences of the brain and surrounding structures were obtained without intravenous contrast. COMPARISON:  Head CT 08/25/2019 and earlier. Brain MRI 09/14/2015. FINDINGS: Brain: Cerebral volume is not significantly changed since  2017. No restricted diffusion to suggest acute infarction. No midline shift, mass effect, evidence of mass lesion, ventriculomegaly, extra-axial collection or acute intracranial hemorrhage. Cervicomedullary junction and pituitary are within normal limits. Patchy bilateral cerebral white matter T2 and FLAIR hyperintensity with involvement of the basal ganglia and left thalamus is stable. Patchy signal changes in the dorsal pons are stable. No cortical encephalomalacia or chronic cerebral blood products identified. No new signal abnormality. Vascular: Major intracranial vascular flow voids appear stable since 2017. Skull and upper cervical spine: Widespread cervical spine degeneration. Visualized bone marrow signal is within normal limits. Sinuses/Orbits: Orbits appear stable and negative. There is now a mucous retention cyst in  the left maxillary sinus. Remaining paranasal sinuses are well pneumatized. Other: Mastoids remain clear. Small volume retained secretions in the pharynx. IMPRESSION: 1. No acute intracranial abnormality. 2. Mildly motion degraded but stable noncontrast MRI appearance of chronic small vessel disease since 2017. Electronically Signed   By: Genevie Ann M.D.   On: 08/27/2019 18:33   EEG adult  Result Date: 08/26/2019 Lora Havens, MD     08/26/2019  2:57 PM Patient Name: Shawna Lin MRN: [id number] Epilepsy Attending: Lora Havens Referring Physician/Provider: Dr. Shela Leff Date: 08/26/2019 Duration: 22.54 minutes Patient history: 86 year old female who presented with recurrent syncope.  EEG to evaluate for seizures. Level of alertness: Awake AEDs during EEG study: Keppra Technical aspects: This EEG study was done with scalp electrodes positioned according to the 10-20 International system of electrode placement. Electrical activity was acquired at a sampling rate of 500Hz  and reviewed with a high frequency filter of 70Hz  and a low frequency filter of 1Hz . EEG data were recorded continuously and digitally stored. Description: During awake state, no clear posterior dominant rhythm was seen.  EEG showed 15 to 18 Hz patient beta activity with irregular morphology distributed symmetrically and diffusely.  Physiologic photic driving was seen during photic stimulation.  Hyperventilation was not performed. Abnormality -Beta activity, generalized IMPRESSION: This study is within normal limits. The beta activity seen in the background is a benign EEG pattern which can be seen in patients with benzodiazepine use. No seizures or epileptiform discharges were seen throughout the recording. Lora Havens   ECHOCARDIOGRAM COMPLETE  Result Date: 08/26/2019    ECHOCARDIOGRAM REPORT   Patient Name:   Shawna Lin Date of Exam: 08/26/2019 Medical Rec #:  [id number]     Height:       63.0 in Accession #:     [id number]    Weight:       165.0 lb Date of Birth:  07-04-1929      BSA:          1.78 m Patient Age:    31 years      BP:           155/63 mmHg Patient Gender: F             HR:           67 bpm. Exam Location:  Inpatient Procedure: 2D Echo, Color Doppler and Cardiac Doppler Indications:    R55 Syncope  History:        Patient has prior history of Echocardiogram examinations, most                 recent 12/22/2015. Signs/Symptoms:Syncope; Risk                 Factors:Hypertension.  Sonographer:    Raquel Sarna Senior RDCS Referring Phys: [phone] [id number]  RATHORE IMPRESSIONS  1. Left ventricular ejection fraction, by estimation, is 65 to 70%. The left ventricle has normal function. The left ventrical has no regional wall motion abnormalities. There is mildly increased left ventricular hypertrophy. Left ventricular diastolic parameters are consistent with Grade I diastolic dysfunction (impaired relaxation). LV filling pressures not assessed.  2. Right ventricular systolic function was not well visualized. The right ventricular size is not well visualized.  3. No evidence of mitral valve regurgitation.  4. The aortic valve has an indeterminant number of cusps. Aortic valve regurgitation is not visualized. Mild to moderate aortic valve stenosis. Aortic valve area, by VTI measures 1.44 cm. Aortic valve mean gradient measures 10.0 mmHg. Aortic valve Vmax  measures 2.35 m/s.  5. The inferior vena cava is normal in size with greater than 50% respiratory variability, suggesting right atrial pressure of 3 mmHg. FINDINGS  Left Ventricle: Left ventricular ejection fraction, by estimation, is 65 to 70%. The left ventricle has normal function. The left ventricle has no regional wall motion abnormalities. The left ventricular internal cavity size was normal in size. There is  mildly increased left ventricular hypertrophy. Concentric left ventricular hypertrophy. Left ventricular diastolic parameters are consistent with Grade I diastolic  dysfunction (impaired relaxation). Right Ventricle: The right ventricular size is not well visualized. Right vetricular wall thickness was not assessed. Right ventricular systolic function was not well visualized. Left Atrium: Left atrial size was normal in size. Right Atrium: Right atrial size was normal in size. Pericardium: There is no evidence of pericardial effusion. Mitral Valve: The mitral valve is normal in structure and function. Normal mobility of the mitral valve leaflets. No evidence of mitral valve regurgitation. No evidence of mitral valve stenosis. Tricuspid Valve: The tricuspid valve is normal in structure. Tricuspid valve regurgitation is not demonstrated. No evidence of tricuspid stenosis. Aortic Valve: The aortic valve has an indeterminant number of cusps. . There is moderate thickening and severe calcifcation of the aortic valve. Aortic valve regurgitation is not visualized. Mild to moderate aortic stenosis is present. There is moderate thickening of the aortic valve. There is severe calcifcation of the aortic valve. Aortic valve mean gradient measures 10.0 mmHg. Aortic valve peak gradient measures 22.1 mmHg. Aortic valve area, by VTI measures 1.44 cm. Pulmonic Valve: The pulmonic valve was normal in structure. Pulmonic valve regurgitation is not visualized. No evidence of pulmonic stenosis. Aorta: The aortic root is normal in size and structure. Venous: The inferior vena cava is normal in size with greater than 50% respiratory variability, suggesting right atrial pressure of 3 mmHg. The inferior vena cava and the hepatic vein show a normal flow pattern. IAS/Shunts: No atrial level shunt detected by color flow Doppler.  LEFT VENTRICLE PLAX 2D LVIDd:         2.70 cm LVIDs:         1.50 cm LV PW:         1.20 cm LV IVS:        1.20 cm LVOT diam:     1.80 cm LV SV:         57.00 ml LV SV Index:   11.34 LVOT Area:     2.54 cm  LEFT ATRIUM           Index LA diam:      2.90 cm 1.63 cm/m LA Vol  (A2C): 23.4 ml 13.13 ml/m LA Vol (A4C): 32.3 ml 18.13 ml/m  AORTIC VALVE AV Area (Vmax):    1.15 cm AV Area (  Vmean):   1.46 cm AV Area (VTI):     1.44 cm AV Vmax:           235.00 cm/s AV Vmean:          146.000 cm/s AV VTI:            0.396 m AV Peak Grad:      22.1 mmHg AV Mean Grad:      10.0 mmHg LVOT Vmax:         106.00 cm/s LVOT Vmean:        83.900 cm/s LVOT VTI:          0.224 m LVOT/AV VTI ratio: 0.57  AORTA Ao Root diam: 2.40 cm Ao Asc diam:  2.80 cm MITRAL VALVE MV Area (PHT): 2.71 cm              SHUNTS MV Decel Time: 280 msec              Systemic VTI:  0.22 m MV E velocity: 72.80 cm/s  103 cm/s  Systemic Diam: 1.80 cm MV A velocity: 113.00 cm/s 70.3 cm/s MV E/A ratio:  0.64        1.5 Fransico Him MD Electronically signed by Fransico Him MD Signature Date/Time: 08/26/2019/9:52:27 AM    Final    VAS US CAROTID  Result Date: 08/27/2019 Carotid Arterial Duplex Study Indications:       Syncope. Risk Factors:      Hypertension. Comparison Study:  No prior studies. Performing Technologist: Oliver Hum RVT  Examination Guidelines: A complete evaluation includes B-mode imaging, spectral Doppler, color Doppler, and power Doppler as needed of all accessible portions of each vessel. Bilateral testing is considered an integral part of a complete examination. Limited examinations for reoccurring indications may be performed as noted.  Right Carotid Findings: +----------+--------+--------+--------+-----------------------+--------+           PSV cm/sEDV cm/sStenosisPlaque Description     Comments +----------+--------+--------+--------+-----------------------+--------+ CCA Prox  123     23              smooth and heterogenoustortuous +----------+--------+--------+--------+-----------------------+--------+ CCA Distal58      17              smooth and heterogenous         +----------+--------+--------+--------+-----------------------+--------+ ICA Prox  50      9                smooth and heterogenous         +----------+--------+--------+--------+-----------------------+--------+ ICA Distal90      27                                     tortuous +----------+--------+--------+--------+-----------------------+--------+ ECA       81      11                                              +----------+--------+--------+--------+-----------------------+--------+ +----------+--------+-------+--------+-------------------+           PSV cm/sEDV cmsDescribeArm Pressure (mmHG) +----------+--------+-------+--------+-------------------+ [id number]                                        +----------+--------+-------+--------+-------------------+ +---------+--------+--+--------+-+---------+ VertebralPSV cm/s52EDV cm/s8Antegrade +---------+--------+--+--------+-+---------+  Left Carotid Findings: +----------+--------+--------+--------+-----------------------+--------+           PSV cm/sEDV cm/sStenosisPlaque Description     Comments +----------+--------+--------+--------+-----------------------+--------+ CCA Prox  119     21              smooth and heterogenoustortuous +----------+--------+--------+--------+-----------------------+--------+ CCA Distal90      20              smooth and heterogenous         +----------+--------+--------+--------+-----------------------+--------+ ICA Prox  61      18              smooth and heterogenous         +----------+--------+--------+--------+-----------------------+--------+ ICA Distal65      19                                     tortuous +----------+--------+--------+--------+-----------------------+--------+ ECA       91      13                                              +----------+--------+--------+--------+-----------------------+--------+ +----------+--------+--------+--------+-------------------+           PSV cm/sEDV cm/sDescribeArm Pressure (mmHG)  +----------+--------+--------+--------+-------------------+ [id number]                                         +----------+--------+--------+--------+-------------------+ +---------+--------+--+--------+-+---------+ VertebralPSV cm/s42EDV cm/s9Antegrade +---------+--------+--+--------+-+---------+   Summary: Right Carotid: Velocities in the right ICA are consistent with a 1-39% stenosis. Left Carotid: Velocities in the left ICA are consistent with a 1-39% stenosis. Vertebrals: Bilateral vertebral arteries demonstrate antegrade flow. *See table(s) above for measurements and observations.  Electronically signed by Harold Barban MD on 08/27/2019 at 7:28:36 AM.    Final    Telemetry    08/28/19 NSR with HR 70's and infrequent PVCs- Personally Reviewed  ECG    No new tracing as of 08/28/19- Personally Reviewed  Cardiac Studies   Echocardiogram 08/26/19:  1. Left ventricular ejection fraction, by estimation, is 65 to 70%. The  left ventricle has normal function. The left ventrical has no regional  wall motion abnormalities. There is mildly increased left ventricular  hypertrophy. Left ventricular diastolic  parameters are consistent with Grade I diastolic dysfunction (impaired  relaxation). LV filling pressures not assessed.  2. Right ventricular systolic function was not well visualized. The right  ventricular size is not well visualized.  3. No evidence of mitral valve regurgitation.  4. The aortic valve has an indeterminant number of cusps. Aortic valve  regurgitation is not visualized. Mild to moderate aortic valve stenosis.  Aortic valve area, by VTI measures 1.44 cm. Aortic valve mean gradient  measures 10.0 mmHg. Aortic valve Vmax  measures 2.35 m/s.  5. The inferior vena cava is normal in size with greater than 50%  respiratory variability, suggesting right atrial pressure of 3 mmHg.   Carotid duplex 08/26/19:  Summary:  Right Carotid: Velocities in the right  ICA are consistent with a 1-39%  stenosis.   Left Carotid: Velocities in the left ICA are consistent with a 1-39%  stenosis.   Vertebrals: Bilateral vertebral arteries demonstrate antegrade flow.   Patient Profile     86 y.o. female  with a hx of advanced dementia, seizure disorder and hypertension who is being seen today for the evaluation of syncope of unknown etiology at the request of Dr. Marlowe Sax.  Assessment & Plan    1.  Recurrent syncope with unclear etiology: -Patient presented from the memory care unit at [facility] assisted living facility at which time it was noted that the patient had a syncopal episode with loss of consciousness which apparently has occurred multiple times over the last several weeks.  -QTc was found to be prolonged on arrival however patient has been on Aricept and Namenda which has since been discontinued.  QTc has improved since this time from 537ms to 347ms.   -Echocardiogram performed with an LVEF at 65 to 70% with G1 DD and mild to moderate aortic valve stenosis and no other structural abnormalities.   -EEG performed with no epileptiform activity. Carotid duplex study currently with [id number] RICA and [id number] LICA -Telemetry review today with NSR and no arrhthymias, occasional PVCs -Continue to plan for OP cardiac monitoring at discharge to further evaluate for possible arrhythmia however given no clear structural abnormality, arrhythmia on cardiac tracing or abnormal electrolytes, syncopal episodes may be in the setting of vasovagal events versus uncontrolled seizures?  2.  Prolonged QTC on EKG: -Patient on home Zyprexa, Aricept as well as Namenda which could be contributing -Continue to monitor on telemetry -Keep K+ greater than 4.0 and Mg+ greater than 2.0 -Qtc on initial EKG at 523ms with repeat EKG this AM with improvement at 363ms.  -No arrhythmia per telemetry review>>occasional PVC  3.  History of seizure disorder: -Continue home Keppra, primary  team checking Keppra levels -EEG for further evaluation>>> per neurology note, study was within normal limits with no seizure or epileptiform discharges seen throughout the recording  4.  Dementia: -Has progressive dementia, resides in memory care unit at [facility] currently on hold secondary to #2  5.  Hypertension: -BPs have been variable in range, 141/68>122/67>136/78 -Antihypertensive currently on hold at this time   Signed, Kathyrn Drown NP-C [place] Pager: [phone] 08/28/2019, 7:20 AM     For questions or updates, please contact   Please consult [email or web address] for contact info under Cardiology/STEMI.  History and all data above reviewed.  Patient examined.  I agree with the findings as above.  Doing OK.  No further syncope.  Demented.  No distress The patient exam reveals COR:RRR  ,  Lungs: Clear  ,  Abd: Positive bowel sounds, no rebound no guarding, Ext No edema  .  All available labs, radiology testing, previous records reviewed. Agree with documented assessment and plan.   Syncope:  No clear cardiac etiology.  Plan for out patient monitor although this will likely be low yield.    Jeneen Rinks Yashar Inclan  11:44 AM  08/28/2019

## 2019-08-28 NOTE — TOC Transition Note (Signed)
Transition of Care [facility]) - CM/SW Discharge Note   Patient Details  Name: Shawna Lin MRN: [id number] Date of Birth: [birth year]  [facility] CM/SW Contact:  Trish Mage, LCSW Phone Number: 08/28/2019, 12:09 PM   Clinical Narrative:  Patient to transition back to [facility] on [facility] today.  PTAR arranged. FL2, d/c summary FAXed.  Nursing, please call report to [phone]. TOC sign off.    Final next level of care: Memory Care Barriers to Discharge: No Barriers Identified   Patient Goals and CMS Choice        Discharge Placement                       Discharge Plan and Services   Discharge Planning Services: CM Consult                                 Social Determinants of Health (SDOH) Interventions     Readmission Risk Interventions No flowsheet data found.

## 2019-08-28 NOTE — Progress Notes (Signed)
Report given to RN at [facility]. Discharge instructions and medication education provided to RN. [facility] will transport pt.

## 2019-08-28 NOTE — Discharge Summary (Signed)
Physician Discharge Summary  Karesha Weckman [id number] DOB: 1928-07-25 DOA: 08/25/2019  PCP: London Pepper, MD  Admit date: 08/25/2019 Discharge date: 08/28/2019  Admitted From:ALF  Disposition:  [facility]  Recommendations for Outpatient Follow-up:  1. Follow up with PCP in 1-2 weeks 2. Please obtain BMP/CBC in one week Please follow up with cardiology for outpatient monitor placement.  Please continue with physical and occupational therapy at the ALF.   Discharge Condition: STABLE.  CODE STATUS:FULL CODE.  Diet recommendation: Heart Healthy  Brief/Interim Summary: 86 year old lady with prior history of dementia, hypertension, vitamin D deficiency comes in to ED for recurrent syncope, always happens after or during her meals.  This is her third admission to the hospital. EKG on admission shows prolonged QTC of 533. CT head without contrast neg for acute stroke. MRI brain shows chronic small vessel disease. EEG negative for epileptiform activity.  Echocardiogram shows LVEF of 65% to 70% . Left ventricular diastolic  parameters are consistent with Grade I diastolic dysfunction (impaired  relaxation). Mild to moderate aortic valve stenosis.  Aortic valve area, by VTI measures 1.44 cm. Aortic valve mean gradient  measures 10.0 mmHg. Aortic valve Vmax measures 2.35 m/s.  Cardiology consulted, recommended monitor placement as outpatient and watch for arrhythmia's at the time of the event.    Discharge Diagnoses:  Principal Problem:   Recurrent syncope Active Problems:   HTN (hypertension)   Dementia (HCC)   QT prolongation   Seizure disorder (HCC)   Syncope  Recurrent syncope ?  Vasovagal syncope.  Seizures have been ruled out with a negative EEG.  Echocardiogram ruled out structural heart disease. Overnight telemetry does not show any cardiac ARRHYTHMIA Repeat EKG shows QT interval  has improved. Her niece / Guardian would like to hold the aricept and namenda on discharge.   CT of the head is negative for acute intracranial abnormality. MRI brain without contrast for recurrent syncope shows chronic small vessel disease.  No signs of infection so far. Carotid duplex shows Right Carotid: Velocities in the right ICA are consistent with a 1-39%  stenosis. Left Carotid: Velocities in the left ICA are consistent with a 1-39%  stenosis.  Vertebrals: Bilateral vertebral arteries demonstrate antegrade flow.  Orthostatic vital signs are negative.  PT evaluation and fall precautions. Cardiology consulted for recurrent syncope recommended outpatient event monitor placement to check for cardiac arrhythmias   History of seizure disorder As per the patient's guardian she was recently started on [place] at [facility]. EEG this admission does not show any epileptiform activity.     Alzheimer's dementia meds on hold due to prolonged QTC.    Essential hypertension Well-controlled. As orthostatic vital signs are negative, will restart her on norvasc 2.5 mg daily on discharge .    Mild normocytic anemia Hemoglobin stable around nine.   Stage II  CKD Creatinine appears to be at baseline.    Discharge Instructions   Allergies as of 08/28/2019   No Known Allergies     Medication List    STOP taking these medications   donepezil 10 MG tablet Commonly known as: ARICEPT   hydrochlorothiazide 12.5 MG capsule Commonly known as: MICROZIDE   memantine 10 MG tablet Commonly known as: NAMENDA     TAKE these medications   acetaminophen 325 MG tablet Commonly known as: TYLENOL Take 650 mg by mouth every 12 (twelve) hours as needed (knee pain).   amLODipine 2.5 MG tablet Commonly known as: NORVASC Take 2.5 mg by mouth daily.  Aspirin 81 MG EC tablet Take 81 mg by mouth daily.   cholecalciferol 25 MCG (1000 UNIT) tablet Commonly known as: VITAMIN D3 Take 1,000 Units by mouth daily.   Ensure Take 237 mLs by mouth 3 (three) times daily.    levETIRAcetam 250 MG tablet Commonly known as: Keppra Take 1 tablet (250 mg total) by mouth 2 (two) times daily.   OLANZapine 2.5 MG tablet Commonly known as: ZYPREXA Take 2.5 mg by mouth at bedtime.      Follow-up Information    London Pepper, MD. Schedule an appointment as soon as possible for a visit in 1 week(s).   Specialty: Family Medicine Contact information: [address] [place] [address] [phone]          No Known Allergies  Consultations:  Cardiology for recurrent syncope.    Procedures/Studies: DG Chest 2 View  Result Date: 08/25/2019 CLINICAL DATA:  Syncope EXAM: CHEST - 2 VIEW COMPARISON:  11/02/2017 FINDINGS: Frontal and lateral views of the chest demonstrate a stable cardiac silhouette. The patient is rotated toward the right, which accentuates the thoracic aortic ectasia seen previously. No airspace disease, effusion, or pneumothorax. IMPRESSION: 1. No acute intrathoracic process. Electronically Signed   By: Randa Ngo M.D.   On: 08/25/2019 21:54   CT Head Wo Contrast  Result Date: 08/25/2019 CLINICAL DATA:  86 year old female with encephalopathy. EXAM: CT HEAD WITHOUT CONTRAST TECHNIQUE: Contiguous axial images were obtained from the base of the skull through the vertex without intravenous contrast. COMPARISON:  Head CT dated 07/26/2019. FINDINGS: Brain: There is mild age-related atrophy and chronic microvascular ischemic changes. There is no acute intracranial hemorrhage. No mass effect or midline shift. No extra-axial fluid collection. Vascular: No hyperdense vessel or unexpected calcification. Skull: No acute calvarial pathology. There is mottled appearance of the skull with diffuse tiny lucencies with salt and pepper appearance of the calvarium which can be seen in the setting of hyperthyroidism. Clinical correlation is recommended. Sinuses/Orbits: Left maxillary sinus retention cyst or polyp. No air-fluid level. The mastoid  air cells are clear. Other: None IMPRESSION: 1. No acute intracranial pathology. 2. Mild age-related atrophy and chronic microvascular ischemic changes. Electronically Signed   By: Anner Crete M.D.   On: 08/25/2019 19:25   MR BRAIN WO CONTRAST  Result Date: 08/27/2019 CLINICAL DATA:  86 year old female with encephalopathy. EXAM: MRI HEAD WITHOUT CONTRAST TECHNIQUE: Multiplanar, multiecho pulse sequences of the brain and surrounding structures were obtained without intravenous contrast. COMPARISON:  Head CT 08/25/2019 and earlier. Brain MRI 09/14/2015. FINDINGS: Brain: Cerebral volume is not significantly changed since 2017. No restricted diffusion to suggest acute infarction. No midline shift, mass effect, evidence of mass lesion, ventriculomegaly, extra-axial collection or acute intracranial hemorrhage. Cervicomedullary junction and pituitary are within normal limits. Patchy bilateral cerebral white matter T2 and FLAIR hyperintensity with involvement of the basal ganglia and left thalamus is stable. Patchy signal changes in the dorsal pons are stable. No cortical encephalomalacia or chronic cerebral blood products identified. No new signal abnormality. Vascular: Major intracranial vascular flow voids appear stable since 2017. Skull and upper cervical spine: Widespread cervical spine degeneration. Visualized bone marrow signal is within normal limits. Sinuses/Orbits: Orbits appear stable and negative. There is now a mucous retention cyst in the left maxillary sinus. Remaining paranasal sinuses are well pneumatized. Other: Mastoids remain clear. Small volume retained secretions in the pharynx. IMPRESSION: 1. No acute intracranial abnormality. 2. Mildly motion degraded but stable noncontrast MRI appearance of chronic small  vessel disease since 2017. Electronically Signed   By: Genevie Ann M.D.   On: 08/27/2019 18:33   EEG adult  Result Date: 08/26/2019 Lora Havens, MD     08/26/2019  2:57 PM Patient  Name: Willadean Berber MRN: [id number] Epilepsy Attending: Lora Havens Referring Physician/Provider: Dr. Shela Leff Date: 08/26/2019 Duration: 22.54 minutes Patient history: 86 year old female who presented with recurrent syncope.  EEG to evaluate for seizures. Level of alertness: Awake AEDs during EEG study: Keppra Technical aspects: This EEG study was done with scalp electrodes positioned according to the 10-20 International system of electrode placement. Electrical activity was acquired at a sampling rate of 500Hz  and reviewed with a high frequency filter of 70Hz  and a low frequency filter of 1Hz . EEG data were recorded continuously and digitally stored. Description: During awake state, no clear posterior dominant rhythm was seen.  EEG showed 15 to 18 Hz patient beta activity with irregular morphology distributed symmetrically and diffusely.  Physiologic photic driving was seen during photic stimulation.  Hyperventilation was not performed. Abnormality -Beta activity, generalized IMPRESSION: This study is within normal limits. The beta activity seen in the background is a benign EEG pattern which can be seen in patients with benzodiazepine use. No seizures or epileptiform discharges were seen throughout the recording. Lora Havens   ECHOCARDIOGRAM COMPLETE  Result Date: 08/26/2019    ECHOCARDIOGRAM REPORT   Patient Name:   KATONIA JUDAY Date of Exam: 08/26/2019 Medical Rec #:  [id number]     Height:       63.0 in Accession #:    [id number]    Weight:       165.0 lb Date of Birth:  [birth year]      BSA:          1.78 m Patient Age:    5 years      BP:           155/63 mmHg Patient Gender: F             HR:           67 bpm. Exam Location:  Inpatient Procedure: 2D Echo, Color Doppler and Cardiac Doppler Indications:    R55 Syncope  History:        Patient has prior history of Echocardiogram examinations, most                 recent 12/22/2015. Signs/Symptoms:Syncope; Risk                  Factors:Hypertension.  Sonographer:    Raquel Sarna Senior RDCS Referring Phys: [id number] [place]  1. Left ventricular ejection fraction, by estimation, is 65 to 70%. The left ventricle has normal function. The left ventrical has no regional wall motion abnormalities. There is mildly increased left ventricular hypertrophy. Left ventricular diastolic parameters are consistent with Grade I diastolic dysfunction (impaired relaxation). LV filling pressures not assessed.  2. Right ventricular systolic function was not well visualized. The right ventricular size is not well visualized.  3. No evidence of mitral valve regurgitation.  4. The aortic valve has an indeterminant number of cusps. Aortic valve regurgitation is not visualized. Mild to moderate aortic valve stenosis. Aortic valve area, by VTI measures 1.44 cm. Aortic valve mean gradient measures 10.0 mmHg. Aortic valve Vmax  measures 2.35 m/s.  5. The inferior vena cava is normal in size with greater than 50% respiratory variability, suggesting right atrial pressure of 3 mmHg. FINDINGS  Left Ventricle: Left ventricular  ejection fraction, by estimation, is 65 to 70%. The left ventricle has normal function. The left ventricle has no regional wall motion abnormalities. The left ventricular internal cavity size was normal in size. There is  mildly increased left ventricular hypertrophy. Concentric left ventricular hypertrophy. Left ventricular diastolic parameters are consistent with Grade I diastolic dysfunction (impaired relaxation). Right Ventricle: The right ventricular size is not well visualized. Right vetricular wall thickness was not assessed. Right ventricular systolic function was not well visualized. Left Atrium: Left atrial size was normal in size. Right Atrium: Right atrial size was normal in size. Pericardium: There is no evidence of pericardial effusion. Mitral Valve: The mitral valve is normal in structure and function. Normal mobility of  the mitral valve leaflets. No evidence of mitral valve regurgitation. No evidence of mitral valve stenosis. Tricuspid Valve: The tricuspid valve is normal in structure. Tricuspid valve regurgitation is not demonstrated. No evidence of tricuspid stenosis. Aortic Valve: The aortic valve has an indeterminant number of cusps. . There is moderate thickening and severe calcifcation of the aortic valve. Aortic valve regurgitation is not visualized. Mild to moderate aortic stenosis is present. There is moderate thickening of the aortic valve. There is severe calcifcation of the aortic valve. Aortic valve mean gradient measures 10.0 mmHg. Aortic valve peak gradient measures 22.1 mmHg. Aortic valve area, by VTI measures 1.44 cm. Pulmonic Valve: The pulmonic valve was normal in structure. Pulmonic valve regurgitation is not visualized. No evidence of pulmonic stenosis. Aorta: The aortic root is normal in size and structure. Venous: The inferior vena cava is normal in size with greater than 50% respiratory variability, suggesting right atrial pressure of 3 mmHg. The inferior vena cava and the hepatic vein show a normal flow pattern. IAS/Shunts: No atrial level shunt detected by color flow Doppler.  LEFT VENTRICLE PLAX 2D LVIDd:         2.70 cm LVIDs:         1.50 cm LV PW:         1.20 cm LV IVS:        1.20 cm LVOT diam:     1.80 cm LV SV:         57.00 ml LV SV Index:   11.34 LVOT Area:     2.54 cm  LEFT ATRIUM           Index LA diam:      2.90 cm 1.63 cm/m LA Vol (A2C): 23.4 ml 13.13 ml/m LA Vol (A4C): 32.3 ml 18.13 ml/m  AORTIC VALVE AV Area (Vmax):    1.15 cm AV Area (Vmean):   1.46 cm AV Area (VTI):     1.44 cm AV Vmax:           235.00 cm/s AV Vmean:          146.000 cm/s AV VTI:            0.396 m AV Peak Grad:      22.1 mmHg AV Mean Grad:      10.0 mmHg LVOT Vmax:         106.00 cm/s LVOT Vmean:        83.900 cm/s LVOT VTI:          0.224 m LVOT/AV VTI ratio: 0.57  AORTA Ao Root diam: 2.40 cm Ao Asc diam:   2.80 cm MITRAL VALVE MV Area (PHT): 2.71 cm              SHUNTS MV Decel Time: 280  msec              Systemic VTI:  0.22 m MV E velocity: 72.80 cm/s  103 cm/s  Systemic Diam: 1.80 cm MV A velocity: 113.00 cm/s 70.3 cm/s MV E/A ratio:  0.64        1.5 Fransico Him MD Electronically signed by Fransico Him MD Signature Date/Time: 08/26/2019/9:52:27 AM    Final    VAS US CAROTID  Result Date: 08/27/2019 Carotid Arterial Duplex Study Indications:       Syncope. Risk Factors:      Hypertension. Comparison Study:  No prior studies. Performing Technologist: Oliver Hum RVT  Examination Guidelines: A complete evaluation includes B-mode imaging, spectral Doppler, color Doppler, and power Doppler as needed of all accessible portions of each vessel. Bilateral testing is considered an integral part of a complete examination. Limited examinations for reoccurring indications may be performed as noted.  Right Carotid Findings: +----------+--------+--------+--------+-----------------------+--------+           PSV cm/sEDV cm/sStenosisPlaque Description     Comments +----------+--------+--------+--------+-----------------------+--------+ CCA Prox  123     23              smooth and heterogenoustortuous +----------+--------+--------+--------+-----------------------+--------+ CCA Distal58      17              smooth and heterogenous         +----------+--------+--------+--------+-----------------------+--------+ ICA Prox  50      9               smooth and heterogenous         +----------+--------+--------+--------+-----------------------+--------+ ICA Distal90      27                                     tortuous +----------+--------+--------+--------+-----------------------+--------+ ECA       81      11                                              +----------+--------+--------+--------+-----------------------+--------+ +----------+--------+-------+--------+-------------------+            PSV cm/sEDV cmsDescribeArm Pressure (mmHG) +----------+--------+-------+--------+-------------------+ [id number]                                        +----------+--------+-------+--------+-------------------+ +---------+--------+--+--------+-+---------+ VertebralPSV cm/s52EDV cm/s8Antegrade +---------+--------+--+--------+-+---------+  Left Carotid Findings: +----------+--------+--------+--------+-----------------------+--------+           PSV cm/sEDV cm/sStenosisPlaque Description     Comments +----------+--------+--------+--------+-----------------------+--------+ CCA Prox  119     21              smooth and heterogenoustortuous +----------+--------+--------+--------+-----------------------+--------+ CCA Distal90      20              smooth and heterogenous         +----------+--------+--------+--------+-----------------------+--------+ ICA Prox  61      18              smooth and heterogenous         +----------+--------+--------+--------+-----------------------+--------+ ICA Distal65      19  tortuous +----------+--------+--------+--------+-----------------------+--------+ ECA       91      13                                              +----------+--------+--------+--------+-----------------------+--------+ +----------+--------+--------+--------+-------------------+           PSV cm/sEDV cm/sDescribeArm Pressure (mmHG) +----------+--------+--------+--------+-------------------+ [id number]                                         +----------+--------+--------+--------+-------------------+ +---------+--------+--+--------+-+---------+ VertebralPSV cm/s42EDV cm/s9Antegrade +---------+--------+--+--------+-+---------+   Summary: Right Carotid: Velocities in the right ICA are consistent with a 1-39% stenosis. Left Carotid: Velocities in the left ICA are consistent with a 1-39% stenosis.  Vertebrals: Bilateral vertebral arteries demonstrate antegrade flow. *See table(s) above for measurements and observations.  Electronically signed by Harold Barban MD on 08/27/2019 at 7:28:36 AM.    Final        Subjective: No new complaints.   Discharge Exam: Vitals:   08/27/19 2039 08/28/19 0523  BP: 122/67 (!) 141/68  Pulse: (!) 55 71  Resp: 14 18  Temp: 97.6 F (36.4 C) 98.5 F (36.9 C)  SpO2: 97% 100%   Vitals:   08/27/19 1550 08/27/19 2039 08/28/19 0500 08/28/19 0523  BP: 136/78 122/67  (!) 141/68  Pulse: 66 (!) 55  71  Resp: 18 14  18   Temp: 98.2 F (36.8 C) 97.6 F (36.4 C)  98.5 F (36.9 C)  TempSrc: Oral Axillary  Oral  SpO2: 100% 97%  100%  Weight:   77.6 kg     General: Pt is alert, awake, not in acute distress Cardiovascular: RRR, S1/S2 +, no rubs, no gallops Respiratory: CTA bilaterally, no wheezing, no rhonchi Abdominal: Soft, NT, ND, bowel sounds + Extremities: no edema, no cyanosis    The results of significant diagnostics from this hospitalization (including imaging, microbiology, ancillary and laboratory) are listed below for reference.     Microbiology: Recent Results (from the past 240 hour(s))  Respiratory Panel by RT PCR (Flu A&B, Covid) - Nasopharyngeal Swab     Status: None   Collection Time: 08/25/19 10:30 PM   Specimen: Nasopharyngeal Swab  Result Value Ref Range Status   SARS Coronavirus 2 by RT PCR NEGATIVE NEGATIVE Final    Comment: (NOTE) SARS-CoV-2 target nucleic acids are NOT DETECTED. The SARS-CoV-2 RNA is generally detectable in upper respiratoy specimens during the acute phase of infection. The lowest concentration of SARS-CoV-2 viral copies this assay can detect is 131 copies/mL. A negative result does not preclude SARS-Cov-2 infection and should not be used as the sole basis for treatment or other patient management decisions. A negative result may occur with  improper specimen collection/handling, submission of specimen  other than nasopharyngeal swab, presence of viral mutation(s) within the areas targeted by this assay, and inadequate number of viral copies (<131 copies/mL). A negative result must be combined with clinical observations, patient history, and epidemiological information. The expected result is Negative. Fact Sheet for Patients:  PinkCheek.be Fact Sheet for Healthcare Providers:  GravelBags.it This test is not yet ap proved or cleared by the [place] FDA and  has been authorized for detection and/or diagnosis of SARS-CoV-2 by FDA under an Emergency Use Authorization (EUA). This EUA will remain  in effect (meaning this  test can be used) for the duration of the COVID-19 declaration under Section 564(b)(1) of the Act, 21 U.S.C. section 360bbb-3(b)(1), unless the authorization is terminated or revoked sooner.    Influenza A by PCR NEGATIVE NEGATIVE Final   Influenza B by PCR NEGATIVE NEGATIVE Final    Comment: (NOTE) The Xpert Xpress SARS-CoV-2/FLU/RSV assay is intended as an aid in  the diagnosis of influenza from Nasopharyngeal swab specimens and  should not be used as a sole basis for treatment. Nasal washings and  aspirates are unacceptable for Xpert Xpress SARS-CoV-2/FLU/RSV  testing. Fact Sheet for Patients: PinkCheek.be Fact Sheet for Healthcare Providers: GravelBags.it This test is not yet approved or cleared by the [place] FDA and  has been authorized for detection and/or diagnosis of SARS-CoV-2 by  FDA under an Emergency Use Authorization (EUA). This EUA will remain  in effect (meaning this test can be used) for the duration of the  Covid-19 declaration under Section 564(b)(1) of the Act, 21  U.S.C. section 360bbb-3(b)(1), unless the authorization is  terminated or revoked. Performed at [facility], [place] [address] [place], [place] [address]   MRSA PCR Screening     Status: None   Collection Time: 08/27/19 10:23 AM   Specimen: Nasal Mucosa; Nasopharyngeal  Result Value Ref Range Status   MRSA by PCR NEGATIVE NEGATIVE Final    Comment:        The GeneXpert MRSA Assay (FDA approved for NASAL specimens only), is one component of a comprehensive MRSA colonization surveillance program. It is not intended to diagnose MRSA infection nor to guide or monitor treatment for MRSA infections. Performed at [facility], [place] [address], [place], [place] [address]      Labs: BNP (last 3 results) No results for input(s): BNP in the last 8760 hours. Basic Metabolic Panel: Recent Labs  Lab 08/25/19 1857 08/25/19 2130 08/26/19 0457 08/27/19 0824  NA 144  --  143 142  K 3.5  --  3.5  3.5 3.7  CL 104  --  107 109  CO2 28  --  27 26  GLUCOSE 174*  --  82 131*  BUN 22  --  20 20  CREATININE 1.29*  --  1.04* 1.06*  CALCIUM 9.7  --  9.7 9.6  MG  --  2.1  --   --    Liver Function Tests: Recent Labs  Lab 08/25/19 1857  AST 26  ALT 14  ALKPHOS 109  BILITOT 0.5  PROT 7.2  ALBUMIN 4.2   No results for input(s): LIPASE, AMYLASE in the last 168 hours. No results for input(s): AMMONIA in the last 168 hours. CBC: Recent Labs  Lab 08/25/19 1857 08/26/19 0457 08/27/19 0824  WBC 6.3 6.1 4.4  NEUTROABS 3.9  --  1.8  HGB 9.7* 9.5* 9.4*  HCT 33.3* 31.7* 31.5*  MCV 85.8 85.2 86.1  PLT 221 183 167   Cardiac Enzymes: No results for input(s): CKTOTAL, CKMB, CKMBINDEX, TROPONINI in the last 168 hours. BNP: Invalid input(s): POCBNP CBG: Recent Labs  Lab 08/26/19 0747 08/27/19 0728 08/28/19 0520  GLUCAP 80 77 78   D-Dimer No results for input(s): DDIMER in the last 72 hours. Hgb A1c No results for input(s): HGBA1C in the last 72 hours. Lipid Profile No results for input(s): CHOL, HDL, LDLCALC, TRIG, CHOLHDL, LDLDIRECT in the last 72 hours. Thyroid function studies No  results for input(s): TSH, T4TOTAL, T3FREE, THYROIDAB in the last 72 hours.  Invalid input(s): FREET3 Anemia work up No results for input(s): VITAMINB12, FOLATE, FERRITIN, TIBC, IRON, RETICCTPCT in the last 72 hours. Urinalysis    Component Value Date/Time   COLORURINE YELLOW 07/26/2019 1842   APPEARANCEUR HAZY (A) 07/26/2019 1842   LABSPEC 1.026 07/26/2019 1842   PHURINE 5.0 07/26/2019 1842   GLUCOSEU NEGATIVE 07/26/2019 1842   HGBUR NEGATIVE 07/26/2019 1842   BILIRUBINUR NEGATIVE 07/26/2019 1842   KETONESUR NEGATIVE 07/26/2019 1842   PROTEINUR 100 (A) 07/26/2019 1842   NITRITE NEGATIVE 07/26/2019 1842   LEUKOCYTESUR NEGATIVE 07/26/2019 1842   Sepsis Labs Invalid input(s): PROCALCITONIN,  WBC,  LACTICIDVEN Microbiology Recent Results (from the past 240 hour(s))  Respiratory Panel by RT PCR (Flu A&B, Covid) - Nasopharyngeal Swab     Status: None   Collection Time: 08/25/19 10:30 PM   Specimen: Nasopharyngeal Swab  Result Value Ref Range Status   SARS Coronavirus 2 by RT PCR NEGATIVE NEGATIVE Final    Comment: (NOTE) SARS-CoV-2 target nucleic acids are NOT DETECTED. The SARS-CoV-2 RNA is generally detectable in upper respiratoy specimens during the acute phase of infection. The lowest concentration of SARS-CoV-2 viral copies this assay can detect is 131 copies/mL. A negative result does not preclude SARS-Cov-2 infection and should not be used as the sole basis for treatment or other patient management decisions. A negative result may occur with  improper specimen collection/handling, submission of specimen other than nasopharyngeal swab, presence of viral mutation(s) within the areas targeted by this assay, and inadequate number of viral copies (<131 copies/mL). A negative result must be combined with clinical observations, patient history, and epidemiological information. The expected result is Negative. Fact Sheet for Patients:   PinkCheek.be Fact Sheet for Healthcare Providers:  GravelBags.it This test is not yet ap proved or cleared by the [place] FDA and  has been authorized for detection and/or diagnosis of SARS-CoV-2 by FDA under an Emergency Use Authorization (EUA). This EUA will remain  in effect (meaning this test can be used) for the duration of the COVID-19 declaration under Section 564(b)(1) of the Act, 21 U.S.C. section 360bbb-3(b)(1), unless the authorization is terminated or revoked sooner.    Influenza A by PCR NEGATIVE NEGATIVE Final   Influenza B by PCR NEGATIVE NEGATIVE Final    Comment: (NOTE) The Xpert Xpress SARS-CoV-2/FLU/RSV assay is intended as an aid in  the diagnosis of influenza from Nasopharyngeal swab specimens and  should not be used as a sole basis for treatment. Nasal washings and  aspirates are unacceptable for Xpert Xpress SARS-CoV-2/FLU/RSV  testing. Fact Sheet for Patients: PinkCheek.be Fact Sheet for Healthcare Providers: GravelBags.it This test is not yet approved or cleared by the [place] FDA and  has been authorized for detection and/or diagnosis of SARS-CoV-2 by  FDA under an Emergency Use Authorization (EUA). This EUA will remain  in effect (meaning this test can be used) for the duration of the  Covid-19 declaration under Section 564(b)(1) of the Act, 21  U.S.C. section 360bbb-3(b)(1), unless the authorization is  terminated or revoked. Performed at [facility], [place] [address], [place], [place] [address]   MRSA PCR Screening     Status: None   Collection Time: 08/27/19 10:23 AM   Specimen: Nasal Mucosa; Nasopharyngeal  Result Value Ref Range Status   MRSA by PCR NEGATIVE NEGATIVE Final    Comment:        The GeneXpert MRSA Assay (FDA approved for NASAL specimens only), is one component of a comprehensive MRSA  colonization surveillance program. It is not intended to diagnose MRSA infection nor to guide or monitor treatment for MRSA infections. Performed at [facility], [place] [address], [place], [place] [address]      Time coordinating discharge: 36 minutes  SIGNED:   Hosie Poisson, MD  [facility] 08/28/2019, 9:22 AM

## 2019-09-03 ENCOUNTER — Institutional Professional Consult (permissible substitution): Payer: Medicare Other | Admitting: Neurology

## 2019-10-15 ENCOUNTER — Other Ambulatory Visit: Payer: Self-pay

## 2019-10-15 ENCOUNTER — Encounter: Payer: Self-pay | Admitting: Neurology

## 2019-10-15 ENCOUNTER — Telehealth: Payer: Self-pay | Admitting: General Practice

## 2019-10-15 ENCOUNTER — Ambulatory Visit (INDEPENDENT_AMBULATORY_CARE_PROVIDER_SITE_OTHER): Payer: Medicare Other | Admitting: Neurology

## 2019-10-15 VITALS — BP 159/90 | HR 85 | Temp 96.8°F | Ht 59.0 in | Wt 168.0 lb

## 2019-10-15 DIAGNOSIS — R55 Syncope and collapse: Secondary | ICD-10-CM | POA: Diagnosis not present

## 2019-10-15 MED ORDER — LEVETIRACETAM 500 MG PO TABS: 500.0000 mg | ORAL_TABLET | Freq: Two times a day (BID) | ORAL | 3 refills | Status: DC

## 2019-10-15 NOTE — Patient Instructions (Signed)
[phone] - please call and schedule with cardiology  Increase Keppra to 500mg  twice daily  Syncope Syncope is when you pass out (faint) for a short time. It is caused by a sudden decrease in blood flow to the brain. Signs that you may be about to pass out include:  Feeling dizzy or light-headed.  Feeling sick to your stomach (nauseous).  Seeing all white or all black.  Having cold, clammy skin. If you pass out, get help right away. Call your local emergency services (911 in the [place] Do not drive yourself to the hospital. Follow these instructions at home: Watch for any changes in your symptoms. Take these actions to stay safe and help with your symptoms: Lifestyle  Do not drive, use machinery, or play sports until your doctor says it is okay.  Do not drink alcohol.  Do not use any products that contain nicotine or tobacco, such as cigarettes and e-cigarettes. If you need help quitting, ask your doctor.  Drink enough fluid to keep your pee (urine) pale yellow. General instructions  Take over-the-counter and prescription medicines only as told by your doctor.  If you are taking blood pressure or heart medicine, sit up and stand up slowly. Spend a few minutes getting ready to sit and then stand. This can help you feel less dizzy.  Have someone stay with you until you feel stable.  If you start to feel like you might pass out, lie down right away and raise (elevate) your feet above the level of your heart. Breathe deeply and steadily. Wait until all of the symptoms are gone.  Keep all follow-up visits as told by your doctor. This is important. Get help right away if:  You have a very bad headache.  You pass out once or more than once.  You have pain in your chest, belly, or back.  You have a very fast or uneven heartbeat (palpitations).  It hurts to breathe.  You are bleeding from your mouth or your bottom (rectum).  You have black or tarry poop (stool).  You  have jerky movements that you cannot control (seizure).  You are confused.  You have trouble walking.  You are very weak.  You have vision problems. These symptoms may be an emergency. Do not wait to see if the symptoms will go away. Get medical help right away. Call your local emergency services (911 in the [place] Do not drive yourself to the hospital. Summary  Syncope is when you pass out (faint) for a short time. It is caused by a sudden decrease in blood flow to the brain.  Signs that you may be about to faint include feeling dizzy, light-headed, or sick to your stomach, seeing all white or all black, or having cold, clammy skin.  If you start to feel like you might pass out, lie down right away and raise (elevate) your feet above the level of your heart. Breathe deeply and steadily. Wait until all of the symptoms are gone. This information is not intended to replace advice given to you by your health care provider. Make sure you discuss any questions you have with your health care provider. Document Revised: 08/14/2017 Document Reviewed: 08/14/2017 Elsevier Patient Education  2020 [employer].
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
## 2019-10-15 NOTE — Telephone Encounter (Signed)
New Patient   Spoke with Dr. Jaynee Eagles about patient being scheduled for an appointment. Referral has been put in the system for patient and [facility] will be calling with transportation schedule to get appointment scheduled for patient to become established with a cardiologist for Syncope.

## 2019-10-19 ENCOUNTER — Encounter: Payer: Self-pay | Admitting: Neurology

## 2020-01-14 ENCOUNTER — Ambulatory Visit: Payer: Medicare Other | Admitting: Family Medicine

## 2020-01-14 ENCOUNTER — Encounter: Payer: Self-pay | Admitting: Family Medicine

## 2020-07-12 ENCOUNTER — Emergency Department (HOSPITAL_COMMUNITY): Payer: Medicare Other

## 2020-07-12 ENCOUNTER — Inpatient Hospital Stay (HOSPITAL_COMMUNITY)
Admission: EM | Admit: 2020-07-12 | Discharge: 2020-07-23 | DRG: 552 | Disposition: A | Discharge status: Skilled Nursing Facility | Payer: Medicare Other | Attending: Internal Medicine | Admitting: Internal Medicine

## 2020-07-12 ENCOUNTER — Observation Stay (HOSPITAL_COMMUNITY): Payer: Medicare Other

## 2020-07-12 ENCOUNTER — Other Ambulatory Visit: Payer: Self-pay

## 2020-07-12 ENCOUNTER — Encounter (HOSPITAL_COMMUNITY): Payer: Self-pay | Admitting: Internal Medicine

## 2020-07-12 DIAGNOSIS — W19XXXA Unspecified fall, initial encounter: Secondary | ICD-10-CM | POA: Diagnosis present

## 2020-07-12 DIAGNOSIS — N1831 Chronic kidney disease, stage 3a: Secondary | ICD-10-CM | POA: Diagnosis present

## 2020-07-12 DIAGNOSIS — Z800 Family history of malignant neoplasm of digestive organs: Secondary | ICD-10-CM

## 2020-07-12 DIAGNOSIS — D508 Other iron deficiency anemias: Secondary | ICD-10-CM

## 2020-07-12 DIAGNOSIS — S32029A Unspecified fracture of second lumbar vertebra, initial encounter for closed fracture: Secondary | ICD-10-CM | POA: Diagnosis not present

## 2020-07-12 DIAGNOSIS — D696 Thrombocytopenia, unspecified: Secondary | ICD-10-CM | POA: Diagnosis present

## 2020-07-12 DIAGNOSIS — I10 Essential (primary) hypertension: Secondary | ICD-10-CM

## 2020-07-12 DIAGNOSIS — N179 Acute kidney failure, unspecified: Secondary | ICD-10-CM | POA: Diagnosis not present

## 2020-07-12 DIAGNOSIS — W2209XA Striking against other stationary object, initial encounter: Secondary | ICD-10-CM | POA: Diagnosis present

## 2020-07-12 DIAGNOSIS — Z23 Encounter for immunization: Secondary | ICD-10-CM

## 2020-07-12 DIAGNOSIS — S32021B Stable burst fracture of second lumbar vertebra, initial encounter for open fracture: Secondary | ICD-10-CM | POA: Diagnosis not present

## 2020-07-12 DIAGNOSIS — Z20822 Contact with and (suspected) exposure to covid-19: Secondary | ICD-10-CM | POA: Diagnosis present

## 2020-07-12 DIAGNOSIS — G40909 Epilepsy, unspecified, not intractable, without status epilepticus: Secondary | ICD-10-CM | POA: Diagnosis present

## 2020-07-12 DIAGNOSIS — E669 Obesity, unspecified: Secondary | ICD-10-CM | POA: Diagnosis present

## 2020-07-12 DIAGNOSIS — D649 Anemia, unspecified: Secondary | ICD-10-CM | POA: Diagnosis not present

## 2020-07-12 DIAGNOSIS — F0390 Unspecified dementia without behavioral disturbance: Secondary | ICD-10-CM | POA: Diagnosis present

## 2020-07-12 DIAGNOSIS — M25559 Pain in unspecified hip: Secondary | ICD-10-CM

## 2020-07-12 DIAGNOSIS — I129 Hypertensive chronic kidney disease with stage 1 through stage 4 chronic kidney disease, or unspecified chronic kidney disease: Secondary | ICD-10-CM | POA: Diagnosis present

## 2020-07-12 DIAGNOSIS — D500 Iron deficiency anemia secondary to blood loss (chronic): Secondary | ICD-10-CM

## 2020-07-12 DIAGNOSIS — Z531 Procedure and treatment not carried out because of patient's decision for reasons of belief and group pressure: Secondary | ICD-10-CM | POA: Diagnosis present

## 2020-07-12 DIAGNOSIS — Z6833 Body mass index (BMI) 33.0-33.9, adult: Secondary | ICD-10-CM

## 2020-07-12 DIAGNOSIS — R531 Weakness: Secondary | ICD-10-CM

## 2020-07-12 DIAGNOSIS — D513 Other dietary vitamin B12 deficiency anemia: Secondary | ICD-10-CM | POA: Diagnosis present

## 2020-07-12 DIAGNOSIS — Z8249 Family history of ischemic heart disease and other diseases of the circulatory system: Secondary | ICD-10-CM

## 2020-07-12 DIAGNOSIS — Z515 Encounter for palliative care: Secondary | ICD-10-CM

## 2020-07-12 DIAGNOSIS — Z833 Family history of diabetes mellitus: Secondary | ICD-10-CM

## 2020-07-12 DIAGNOSIS — D631 Anemia in chronic kidney disease: Secondary | ICD-10-CM | POA: Diagnosis present

## 2020-07-12 DIAGNOSIS — Z841 Family history of disorders of kidney and ureter: Secondary | ICD-10-CM

## 2020-07-12 DIAGNOSIS — Z7189 Other specified counseling: Secondary | ICD-10-CM

## 2020-07-12 DIAGNOSIS — S0990XA Unspecified injury of head, initial encounter: Principal | ICD-10-CM

## 2020-07-12 DIAGNOSIS — Z7982 Long term (current) use of aspirin: Secondary | ICD-10-CM

## 2020-07-12 DIAGNOSIS — R9431 Abnormal electrocardiogram [ECG] [EKG]: Secondary | ICD-10-CM | POA: Diagnosis present

## 2020-07-12 DIAGNOSIS — Y92129 Unspecified place in nursing home as the place of occurrence of the external cause: Secondary | ICD-10-CM

## 2020-07-12 DIAGNOSIS — E559 Vitamin D deficiency, unspecified: Secondary | ICD-10-CM | POA: Diagnosis present

## 2020-07-12 DIAGNOSIS — Z79899 Other long term (current) drug therapy: Secondary | ICD-10-CM

## 2020-07-12 DIAGNOSIS — W1830XA Fall on same level, unspecified, initial encounter: Secondary | ICD-10-CM | POA: Diagnosis present

## 2020-07-12 DIAGNOSIS — N183 Chronic kidney disease, stage 3 unspecified: Secondary | ICD-10-CM | POA: Diagnosis present

## 2020-07-12 DIAGNOSIS — Y9301 Activity, walking, marching and hiking: Secondary | ICD-10-CM | POA: Diagnosis present

## 2020-07-12 LAB — CBC WITH DIFFERENTIAL/PLATELET
Abs Immature Granulocytes: 0.1 10*3/uL — ABNORMAL HIGH (ref 0.00–0.07)
Abs Immature Granulocytes: 0.11 10*3/uL — ABNORMAL HIGH (ref 0.00–0.07)
Basophils Absolute: 0 10*3/uL (ref 0.0–0.1)
Basophils Absolute: 0 10*3/uL (ref 0.0–0.1)
Basophils Relative: 0 %
Basophils Relative: 0 %
Eosinophils Absolute: 0.2 10*3/uL (ref 0.0–0.5)
Eosinophils Absolute: 0.2 10*3/uL (ref 0.0–0.5)
Eosinophils Relative: 3 %
Eosinophils Relative: 3 %
HCT: 20.4 % — ABNORMAL LOW (ref 36.0–46.0)
HCT: 20 % — ABNORMAL LOW (ref 36.0–46.0)
Hemoglobin: 5.9 g/dL — CL (ref 12.0–15.0)
Hemoglobin: 5.7 g/dL — CL (ref 12.0–15.0)
Immature Granulocytes: 2 %
Immature Granulocytes: 2 %
Lymphocytes Relative: 21 %
Lymphocytes Relative: 21 %
Lymphs Abs: 1.4 10*3/uL (ref 0.7–4.0)
Lymphs Abs: 1.5 10*3/uL (ref 0.7–4.0)
MCH: 23.8 pg — ABNORMAL LOW (ref 26.0–34.0)
MCH: 23.3 pg — ABNORMAL LOW (ref 26.0–34.0)
MCHC: 28.9 g/dL — ABNORMAL LOW (ref 30.0–36.0)
MCHC: 28.5 g/dL — ABNORMAL LOW (ref 30.0–36.0)
MCV: 82.3 fL (ref 80.0–100.0)
MCV: 81.6 fL (ref 80.0–100.0)
Monocytes Absolute: 0.8 10*3/uL (ref 0.1–1.0)
Monocytes Absolute: 0.8 10*3/uL (ref 0.1–1.0)
Monocytes Relative: 12 %
Monocytes Relative: 10 %
Neutro Abs: 4.2 10*3/uL (ref 1.7–7.7)
Neutro Abs: 4.6 10*3/uL (ref 1.7–7.7)
Neutrophils Relative %: 62 %
Neutrophils Relative %: 64 %
Platelets: 180 10*3/uL (ref 150–400)
Platelets: 205 10*3/uL (ref 150–400)
RBC: 2.48 MIL/uL — ABNORMAL LOW (ref 3.87–5.11)
RBC: 2.45 MIL/uL — ABNORMAL LOW (ref 3.87–5.11)
RDW: 22.3 % — ABNORMAL HIGH (ref 11.5–15.5)
RDW: 22.5 % — ABNORMAL HIGH (ref 11.5–15.5)
WBC: 6.7 10*3/uL (ref 4.0–10.5)
WBC: 7.2 10*3/uL (ref 4.0–10.5)
nRBC: 2.2 % — ABNORMAL HIGH (ref 0.0–0.2)
nRBC: 2.9 % — ABNORMAL HIGH (ref 0.0–0.2)

## 2020-07-12 LAB — URINALYSIS, ROUTINE W REFLEX MICROSCOPIC
Bilirubin Urine: NEGATIVE
Glucose, UA: NEGATIVE mg/dL
Hgb urine dipstick: NEGATIVE
Ketones, ur: NEGATIVE mg/dL
Leukocytes,Ua: NEGATIVE
Nitrite: NEGATIVE
Protein, ur: 30 mg/dL — AB
Specific Gravity, Urine: 1.011 (ref 1.005–1.030)
pH: 6 (ref 5.0–8.0)

## 2020-07-12 LAB — BASIC METABOLIC PANEL
Anion gap: 11 (ref 5–15)
BUN: 21 mg/dL (ref 8–23)
CO2: 26 mmol/L (ref 22–32)
Calcium: 9.7 mg/dL (ref 8.9–10.3)
Chloride: 106 mmol/L (ref 98–111)
Creatinine, Ser: 1.46 mg/dL — ABNORMAL HIGH (ref 0.44–1.00)
GFR, Estimated: 34 mL/min — ABNORMAL LOW (ref >60)
Glucose, Bld: 107 mg/dL — ABNORMAL HIGH (ref 70–99)
Potassium: 4.3 mmol/L (ref 3.5–5.1)
Sodium: 143 mmol/L (ref 135–145)

## 2020-07-12 LAB — RESP PANEL BY RT-PCR (FLU A&B, COVID) ARPGX2
Influenza A by PCR: NEGATIVE
Influenza B by PCR: NEGATIVE
SARS Coronavirus 2 by RT PCR: NEGATIVE

## 2020-07-12 LAB — POC OCCULT BLOOD, ED: Fecal Occult Bld: NEGATIVE

## 2020-07-12 MED ORDER — SENNOSIDES-DOCUSATE SODIUM 8.6-50 MG PO TABS: 1.0000 | ORAL_TABLET | Freq: Every evening | ORAL | Status: DC | PRN

## 2020-07-12 MED ORDER — OXYCODONE HCL 5 MG PO TABS
5.0000 mg | ORAL_TABLET | ORAL | Status: DC | PRN
  Administered 2020-07-14 – 2020-07-18 (×3): 5 mg via ORAL
  Filled 2020-07-12 (×3): qty 1

## 2020-07-12 MED ORDER — MORPHINE SULFATE (PF) 2 MG/ML IV SOLN: 1.0000 mg | INTRAVENOUS | Status: DC | PRN

## 2020-07-12 MED ORDER — LEVETIRACETAM 500 MG PO TABS
500.0000 mg | ORAL_TABLET | Freq: Two times a day (BID) | ORAL | Status: DC
  Administered 2020-07-12 – 2020-07-23 (×22): 500 mg via ORAL
  Filled 2020-07-12 (×22): qty 1

## 2020-07-12 MED ORDER — OLANZAPINE 2.5 MG PO TABS
2.5000 mg | ORAL_TABLET | Freq: Every day | ORAL | Status: DC
  Administered 2020-07-12 – 2020-07-22 (×10): 2.5 mg via ORAL
  Filled 2020-07-12 (×11): qty 1

## 2020-07-12 MED ORDER — SODIUM CHLORIDE 0.9 % IV SOLN: INTRAVENOUS | Status: DC

## 2020-07-12 MED ORDER — AMLODIPINE BESYLATE 5 MG PO TABS
2.5000 mg | ORAL_TABLET | Freq: Every day | ORAL | Status: DC
  Administered 2020-07-13: 12:00:00 2.5 mg via ORAL
  Filled 2020-07-12: qty 1

## 2020-07-12 MED ORDER — SODIUM CHLORIDE 0.9 % IV SOLN
10.0000 mL/h | Freq: Once | INTRAVENOUS | Status: AC
  Administered 2020-07-17: 10 mL/h via INTRAVENOUS

## 2020-07-12 MED ORDER — SERTRALINE HCL 100 MG PO TABS
100.0000 mg | ORAL_TABLET | Freq: Every day | ORAL | Status: DC
  Administered 2020-07-13 – 2020-07-23 (×11): 100 mg via ORAL
  Filled 2020-07-12 (×11): qty 1

## 2020-07-12 MED ORDER — ENSURE ENLIVE PO LIQD
237.0000 mL | Freq: Two times a day (BID) | ORAL | Status: DC
  Administered 2020-07-13 – 2020-07-22 (×14): 237 mL via ORAL

## 2020-07-12 NOTE — Plan of Care (Signed)
  Problem: Clinical Measurements: Goal: Diagnostic test results will improve Outcome: Progressing   Problem: Clinical Measurements: Goal: Respiratory complications will improve Outcome: Progressing   Problem: Clinical Measurements: Goal: Cardiovascular complication will be avoided Outcome: Progressing   Problem: Nutrition: Goal: Adequate nutrition will be maintained Outcome: Progressing   Problem: Coping: Goal: Level of anxiety will decrease Outcome: Progressing   Problem: Skin Integrity: Goal: Risk for impaired skin integrity will decrease Outcome: Progressing

## 2020-07-12 NOTE — ED Provider Notes (Signed)
Shawna Lin DEPT Provider Note   CSN: [id number] Arrival date & time: 07/12/20  1233     History Chief Complaint  Patient presents with  . Fall    Shawna Lin is a 86 y.o. female with a past medical history significant for hypertension, iron deficiency anemia, history of dementia, and vitamin D deficiency who presents to the ED via EMS after witnessed mechanical fall.  Spoke to Shawna Lin at [facility] who notes patient was seen ambulating without her walker and before staff could get to patient, she was seen falling and hitting her head on a door. No LOC. She takes ASA 81mg  daily, but no other blood thinners. Shawna Lin states that patient was at baseline after the fall; however, notes that patient was acting different this morning. She states that patient's attitude was different and she was less talkative and interactive than her baseline. Patient's BP was checked earlier today which was elevated in 170s/100s. Shawna Lin notes that prior to EMS arrival, patient was complaining of low back pain and knee pain. No treatment prior to arrival.   Level 5 caveat secondary to history of dementia.    Past Medical History:  Diagnosis Date  . Hypertension   . Iron deficiency anemia   . Memory loss   . Vitamin D deficiency     Patient Active Problem List   Diagnosis Date Noted  . L2 vertebral fracture ([place]) 07/12/2020  . Anemia 07/12/2020  . Fall 07/12/2020  . CKD (chronic kidney disease), stage III ([place]) 07/12/2020  . AKI (acute kidney injury) ([place]) 07/12/2020  . Syncope 08/26/2019  . Recurrent syncope 08/25/2019  . QT prolongation 08/25/2019  . Seizure disorder ([place]) 08/25/2019  . Pain due to onychomycosis of toenails of both feet 01/07/2019  . Dementia ([place]) 12/21/2015  . No blood products 12/21/2015  . 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 Memory loss 09/02/2015  . HTN (hypertension) 09/02/2015    Past Surgical History:  Procedure Laterality  Date  . UTERINE FIBROID SURGERY       OB History   No obstetric history on file.     Family History  Problem Relation Age of Onset  . Heart attack Father   . Hypertension Mother   . Colon cancer Brother   . Hypertension Brother   . Diabetes Sister   . Kidney failure Sister   . Hypertension Sister   . Dementia Neg Hx     Social History   Tobacco Use  . Smoking status: Never Smoker  . Smokeless tobacco: Never Used  Vaping Use  . Vaping Use: Never used  Substance Use Topics  . Alcohol use: No    Alcohol/week: 0.0 standard drinks  . Drug use: No    Home Medications Prior to Admission medications   Medication Sig Start Date End Date Taking? Authorizing Provider  acetaminophen (TYLENOL) 325 MG tablet Take 650 mg by mouth every 12 (twelve) hours as needed (knee pain).    Yes [provider]  amLODipine (NORVASC) 2.5 MG tablet Take 2.5 mg by mouth daily. 12/12/17  Yes [provider]  Aspirin 81 MG EC tablet Take 81 mg by mouth daily.   Yes [provider]  cholecalciferol (VITAMIN D3) 25 MCG (1000 UNIT) tablet Take 1,000 Units by mouth daily.   Yes [provider]  Emollient (EUCERIN) lotion Apply 1 mL topically in the morning and at bedtime. Apply 1 application to lower extremities twice daily   Yes [provider]  Ensure (ENSURE) Take 237 mLs by mouth 3 (three) times daily.   Yes [provider]  furosemide (LASIX) 20 MG tablet Take 20 mg by mouth daily. 06/30/20  Yes [provider]  levETIRAcetam (KEPPRA) 500 MG tablet Take 1 tablet (500 mg total) by mouth 2 (two) times daily. 10/15/19  Yes Melvenia Beam, MD  OLANZapine (ZYPREXA) 2.5 MG tablet Take 2.5 mg by mouth at bedtime.   Yes [provider]  sertraline (ZOLOFT) 100 MG tablet Take 100 mg by mouth daily.   Yes [provider]    Allergies    Patient has no known allergies.  Review of Systems   Review of Systems  Unable to perform  ROS: Dementia    Physical Exam Updated Vital Signs BP (!) 161/81 (BP Location: Right Arm)   Pulse 71   Temp (!) 97.5 F (36.4 C)   Resp (!) 22   SpO2 100%   Physical Exam Vitals and nursing note reviewed.  Constitutional:      General: She is not in acute distress.    Appearance: She is not toxic-appearing.  HENT:     Head: Normocephalic.  Eyes:     Pupils: Pupils are equal, round, and reactive to light.  Neck:     Comments: c collar in place. No cervical midline tenderness. Cardiovascular:     Rate and Rhythm: Normal rate and regular rhythm.     Pulses: Normal pulses.     Heart sounds: Normal heart sounds. No murmur heard. No friction rub. No gallop.   Pulmonary:     Effort: Pulmonary effort is normal.     Breath sounds: Normal breath sounds.  Abdominal:     General: Abdomen is flat. There is no distension.     Palpations: Abdomen is soft.     Tenderness: There is no abdominal tenderness. There is no guarding or rebound.  Musculoskeletal:     Cervical back: Neck supple.     Comments: No thoracic or lumbar midline tenderness. No bony tenderness at bilateral hips. Mild edema over anterior aspect of right knee. Full ROM of bilateral hips and knees.   Skin:    General: Skin is warm and dry.  Neurological:     Mental Status: She is alert. Mental status is at baseline.     Comments: Patient at baseline per living facility. No facial droop. Clear speech.     ED Results / Procedures / Treatments   Labs (all labs ordered are listed, but only abnormal results are displayed) Labs Reviewed  CBC WITH DIFFERENTIAL/PLATELET - Abnormal; Notable for the following components:      Result Value   RBC 2.48 (*)    Hemoglobin 5.9 (*)    HCT 20.4 (*)    MCH 23.8 (*)    MCHC 28.9 (*)    RDW 22.3 (*)    nRBC 2.2 (*)    Abs Immature Granulocytes 0.10 (*)    All other components within normal limits  BASIC METABOLIC PANEL - Abnormal; Notable for the following components:    Glucose, Bld 107 (*)    Creatinine, Ser 1.46 (*)    GFR, Estimated 34 (*)    All other components within normal limits  URINALYSIS, ROUTINE W REFLEX MICROSCOPIC - Abnormal; Notable for the following components:   Color, Urine STRAW (*)    Protein, ur 30 (*)    Bacteria, UA RARE (*)    All other components within normal limits  CBC WITH  DIFFERENTIAL/PLATELET - Abnormal; Notable for the following components:   RBC 2.45 (*)    Hemoglobin 5.7 (*)    HCT 20.0 (*)    MCH 23.3 (*)    MCHC 28.5 (*)    RDW 22.5 (*)    nRBC 2.9 (*)    Abs Immature Granulocytes 0.11 (*)    All other components within normal limits  RESP PANEL BY RT-PCR (FLU A&B, COVID) [id number]  COMPREHENSIVE METABOLIC PANEL  CBC  VITAMIN B12  TSH  FOLATE  IRON AND TIBC  POC OCCULT BLOOD, ED  PREPARE RBC (CROSSMATCH)  TYPE AND SCREEN    EKG EKG Interpretation  Date/Time:  Tuesday July 12 2020 17:48:50 EST Ventricular Rate:  76 PR Interval:    QRS Duration: 90 QT Interval:  406 QTC Calculation: 457 R Axis:   77 Text Interpretation: Sinus rhythm Normal ECG Confirmed by Veryl Speak [phone]) on 07/12/2020 5:53:31 PM   Radiology DG Chest 2 View  Result Date: 07/12/2020 CLINICAL DATA:  Recent fall.  Hypertension EXAM: CHEST - 2 VIEW COMPARISON:  Nov 22, 2019 FINDINGS: The lungs are clear. Heart is upper normal in size with pulmonary vascularity normal. No adenopathy. No pneumothorax. There is degenerative change in the lower thoracic spine and in each shoulder. IMPRESSION: Lungs clear.  Heart upper normal in size. Electronically Signed   By: Lowella Grip III M.D.   On: 07/12/2020 14:05   DG Knee 2 Views Right  Addendum Date: 07/12/2020   ADDENDUM REPORT: 07/12/2020 14:11 CORRECT EXAM: PELVIS AND RIGHT HIP: 2+ VIEW Electronically Signed   By: Lowella Grip III M.D.   On: 07/12/2020 14:11   Result Date: 07/12/2020 CLINICAL DATA:  Pain following fall EXAM: RIGHT KNEE - 1-2 VIEW COMPARISON:  June 26, 2018 FINDINGS: Frontal pelvis as well as frontal and lateral right hip images were obtained. Bones osteoporotic. No acute fracture or dislocation. There is moderate symmetric narrowing of each hip joint. No erosive change. There is degenerative change in the lower lumbar spine. There are multiple foci of arterial vascular calcification in the pelvis and thigh regions. IMPRESSION: Symmetric narrowing of each hip joint. No acute fracture or dislocation. Bones osteoporotic. Multiple foci of arterial vascular calcification noted. Electronically Signed: By: Lowella Grip III M.D. On: 07/12/2020 14:07   CT Head Wo Contrast  Result Date: 07/12/2020 CLINICAL DATA:  [place] today with trauma to the head and neck. EXAM: CT HEAD WITHOUT CONTRAST CT CERVICAL SPINE WITHOUT CONTRAST TECHNIQUE: Multidetector CT imaging of the head and cervical spine was performed following the standard protocol without intravenous contrast. Multiplanar CT image reconstructions of the cervical spine were also generated. COMPARISON:  08/27/2019 MRI. FINDINGS: CT HEAD FINDINGS Brain: Generalized atrophy, with temporal lobe predominance. Mild chronic small-vessel ischemic changes of the white matter. No sign of acute infarction, mass lesion, hemorrhage, hydrocephalus or extra-axial collection. Vascular: There is atherosclerotic calcification of the major vessels at the base of the brain. Skull: Chronic mild old appearance of the calvarium which could be due to demineralization, anemia or infiltrating marrow process. Lack of progressive change since January of this year argues against an aggressive process. History describes anemia and vitamin-D deficiency, which could relate to this finding. Sinuses/Orbits: Clear/normal Other: None CT CERVICAL SPINE FINDINGS Alignment: Curvature convex to the right. 2 mm degenerative anterolisthesis at C5-6. Skull base and vertebrae: No fracture. Mild appearance of the bone, similar to the calvarium, though not  as pronounced. Soft tissues and spinal canal: No soft tissue  neck lesion seen. Disc levels: Chronic degenerative spondylosis from C3-4 through C6-7. No apparent significant canal stenosis. Facet osteoarthritis most pronounced on the right at C5-6 and on the left at C2-3, C3-4 and C4-5. No severe bony foraminal stenosis. Upper chest: Negative Other: None IMPRESSION: HEAD CT: No acute or traumatic finding. Atrophy and chronic small-vessel ischemic changes. Atrophy is temporal lobe predominant. CERVICAL SPINE CT: No acute or traumatic finding. Chronic degenerative spondylosis and facet osteoarthritis. Electronically Signed   By: Nelson Chimes M.D.   On: 07/12/2020 13:48   CT Cervical Spine Wo Contrast  Result Date: 07/12/2020 CLINICAL DATA:  [place] today with trauma to the head and neck. EXAM: CT HEAD WITHOUT CONTRAST CT CERVICAL SPINE WITHOUT CONTRAST TECHNIQUE: Multidetector CT imaging of the head and cervical spine was performed following the standard protocol without intravenous contrast. Multiplanar CT image reconstructions of the cervical spine were also generated. COMPARISON:  08/27/2019 MRI. FINDINGS: CT HEAD FINDINGS Brain: Generalized atrophy, with temporal lobe predominance. Mild chronic small-vessel ischemic changes of the white matter. No sign of acute infarction, mass lesion, hemorrhage, hydrocephalus or extra-axial collection. Vascular: There is atherosclerotic calcification of the major vessels at the base of the brain. Skull: Chronic mild old appearance of the calvarium which could be due to demineralization, anemia or infiltrating marrow process. Lack of progressive change since January of this year argues against an aggressive process. History describes anemia and vitamin-D deficiency, which could relate to this finding. Sinuses/Orbits: Clear/normal Other: None CT CERVICAL SPINE FINDINGS Alignment: Curvature convex to the right. 2 mm degenerative anterolisthesis at C5-6. Skull base and vertebrae:  No fracture. Mild appearance of the bone, similar to the calvarium, though not as pronounced. Soft tissues and spinal canal: No soft tissue neck lesion seen. Disc levels: Chronic degenerative spondylosis from C3-4 through C6-7. No apparent significant canal stenosis. Facet osteoarthritis most pronounced on the right at C5-6 and on the left at C2-3, C3-4 and C4-5. No severe bony foraminal stenosis. Upper chest: Negative Other: None IMPRESSION: HEAD CT: No acute or traumatic finding. Atrophy and chronic small-vessel ischemic changes. Atrophy is temporal lobe predominant. CERVICAL SPINE CT: No acute or traumatic finding. Chronic degenerative spondylosis and facet osteoarthritis. Electronically Signed   By: Nelson Chimes M.D.   On: 07/12/2020 13:48   CT Lumbar Spine Wo Contrast  Result Date: 07/12/2020 CLINICAL DATA:  Status post fall, low back pain EXAM: CT LUMBAR SPINE WITHOUT CONTRAST TECHNIQUE: Multidetector CT imaging of the lumbar spine was performed without intravenous contrast administration. Multiplanar CT image reconstructions were also generated. COMPARISON:  None. FINDINGS: Segmentation: 5 lumbar type vertebrae. Alignment: Grade 1 anterolisthesis of L5 on S1 secondary to bilateral L5 pars interarticularis defects. Osseous fusion across the L5-S1 disc space. Vertebrae: Severe osteopenia. L2 vertebral body compression fracture with approximately 50% height loss and 2 mm of focal central retropulsion without spinal stenosis. No aggressive osseous lesion. Paraspinal and other soft tissues: No acute paraspinal abnormality. Abdominal aortic atherosclerosis. Disc levels: Degenerative disease with disc height loss at L2-3, L3-4, and L4-5. Osseous fusion of the L5-S1 disc spaces. T12-L1: Broad-based disc osteophyte complex. No foraminal or central canal stenosis. L1-2: Minimal broad-based disc bulge. No foraminal or central canal stenosis. Mild bilateral facet arthropathy. L2-3: Mild broad-based disc bulge. Mild  bilateral facet arthropathy. No foraminal or central canal stenosis. L3-4: Broad-based disc bulge. Mild bilateral facet arthropathy. Mild left foraminal stenosis. No right foraminal stenosis. No central canal stenosis. L4-5: Broad-based disc osteophyte complex. Moderate bilateral facet arthropathy. No  definite foraminal or central canal stenosis. IMPRESSION: 1. Acute L2 vertebral body compression fracture with approximately 50% height loss and 2 mm of focal central retropulsion without spinal stenosis. 2. Diffuse lumbar spine spondylosis as described above. 3. Aortic Atherosclerosis (ICD10-I70.0). Electronically Signed   By: Kathreen Devoid   On: 07/12/2020 13:54    Procedures Procedures (including critical care time)  Medications Ordered in ED Medications  0.9 %  sodium chloride infusion (has no administration in time range)  amLODipine (NORVASC) tablet 2.5 mg (has no administration in time range)  OLANZapine (ZYPREXA) tablet 2.5 mg (has no administration in time range)  sertraline (ZOLOFT) tablet 100 mg (has no administration in time range)  levETIRAcetam (KEPPRA) tablet 500 mg (has no administration in time range)  0.9 %  sodium chloride infusion (has no administration in time range)  oxyCODONE (Oxy IR/ROXICODONE) immediate release tablet 5 mg (has no administration in time range)  morphine 2 MG/ML injection 1 mg (has no administration in time range)  senna-docusate (Senokot-S) tablet 1 tablet (has no administration in time range)    ED Course  I have reviewed the triage vital signs and the nursing notes.  Pertinent labs & imaging results that were available during my care of the patient were reviewed by me and considered in my medical decision making (see chart for details).  Clinical Course as of 07/12/20 Vernelle Emerald  Tue Jul 12, 2020  1641 Fecal Occult Blood, POC: NEGATIVE [CA]    Clinical Course User Index [CA] Suzy Bouchard, PA-C   MDM Rules/Calculators/A&P                          86 year old female presents to the ED after a witnessed mechanical fall.  Shanelle at [facility] states patient was ambulating without her walker and fell and hit her head against a door.  No loss of consciousness.  She currently takes ASA 81 mg, but no other anticoagulants.  Staff also noted patient to be acting slightly abnormal earlier today prior to the fall.  Upon arrival, stable vitals.  BP elevated 168/81 which appears to be around patient's baseline.  Patient in no acute distress.  No cervical, thoracic, or lumbar midline tenderness.  No bony tenderness of her bilateral hips.  Full range of motion of bilateral hips and knees.  Mild edema over anterior aspect of right knee. Patient at baseline per living facility. Will obtain routine labs and UA given reported abnormal behavior this morning. CT head, cervical, and lumbar spine to rule out bony fractures and intracranial abnormalities. X-rays of chest, right hip, and right knee to rule out bony fractures.  CT lumbar spine personally reviewed which demonstrates: IMPRESSION:  1. Acute L2 vertebral body compression fracture with approximately  50% height loss and 2 mm of focal central retropulsion without  spinal stenosis.  2. Diffuse lumbar spine spondylosis as described above.  3. Aortic Atherosclerosis (ICD10-I70.0).   CT cervical spine and head personally reviewed which is negative for any acute abnormalities.  Chest x-ray, knee, and hip x-rays personally reviewed which are negative for any bony fractures and acute abnormalities.  CBC significant for severe anemia with hemoglobin of 5.9.  BMP significant for elevated creatinine at 1.46 and hyperglycemia 107.  No anion gap.  Doubt DKA. Attempted to call friend who number is in the chart, but no answer. Will transfuse 1 unit here in the ED and consult hospitalist for admission.   5:01 PM discussed  lumbar CT results with Marlana Salvage with neurosurgery who will see patient and order  lumbar brace.   5:17 PM Discussed case with Dr. Starla Link with [facility] who agrees to admit patient for further treatment. COVID test ordered.   5:24 PM Spoke to patient's, niece, [name] on the phone who notes that patient would not want any blood transfusions because she is a Neurosurgeon witness. She confirmed full code.   Discussed case with Dr. Roslynn Amble who agrees with assessment and plan.   Final Clinical Impression(s) / ED Diagnoses Final diagnoses:  Injury of head, initial encounter  Fall, initial encounter  Anemia, unspecified type    Rx / DC Orders ED Discharge Orders    None       Karie Kirks 07/12/20 Stanton Kidney, MD 07/14/20 [id number]

## 2020-07-12 NOTE — ED Notes (Signed)
Pt pulling leads and BP cuff off and advised she doesn't need them anymore.

## 2020-07-12 NOTE — H&P (Signed)
History and Physical    Allicia Culley JOA:[id number] DOB: 10-31-1928 DOA: 07/12/2020  PCP: System, Provider Not In   Patient coming from: Memory care facility/[facility]  I have personally briefly reviewed patient's old medical records in [place]  Chief Complaint: Fall  HPI: Anara Cowman is a 86 y.o. female with medical history significant of dementia, hypertension, seizure disorder, prolonged QTC, vitamin D deficiency who presented from the facility after witnessed mechanical fall.  As per ED providers documentation who spoke to the facility staff, patient was seen ambulating without her walker and before staff could get to the patient, she will was seen falling and hitting her head on the door.  No loss of consciousness was reported.  Patient is on baby aspirin.  Apparently patient was acting different since the fall and was less talkative and interactive than her baseline.  Later on her blood pressure was checked and was elevated at 170s over 100s.  Patient was complaining of low back pain and knee pain prior to EMS arrival.  No other history can be obtained because of patient being demented.  ED Course: She was hemodynamically stable.  She was found to have hemoglobin of 5.9, prior hemoglobin in February 2021 was 9.5.  She had a creatinine of 1.46.  She was found to have acute L2 vertebral body compression fracture with approximately 50% height loss and 2 mm of focal central repulsion without spinal stenosis.  ED provider spoke to on-call neurosurgery who stated that she would see the patient and would order lumbar brace.  CT of the head and cervical spine was negative for acute intracranial or cervical spinal injury.  Right knee x-ray was negative for any acute fracture.  Chest x-ray was negative for acute abnormality.  Fecal occult blood test was negative.  UA was negative as well.  ED provider ordered blood transfusion but apparently patient is a Jehovah's Witness and the  niece/[name] refused blood transfusion for the patient as per ED providers documentation. Hospitalist service was called to evaluate the patient.  Review of Systems: Could not be reviewed because of patient being demented and confused.  Past Medical History:  Diagnosis Date  . Hypertension   . Iron deficiency anemia   . Memory loss   . Vitamin D deficiency     Past Surgical History:  Procedure Laterality Date  . UTERINE FIBROID SURGERY     Social history  reports that she has never smoked. She has never used smokeless tobacco. She reports that she does not drink alcohol and does not use drugs.  Currently in a memory care facility  No Known Allergies  Family History  Problem Relation Age of Onset  . Heart attack Father   . Hypertension Mother   . Colon cancer Brother   . Hypertension Brother   . Diabetes Sister   . Kidney failure Sister   . Hypertension Sister   . Dementia Neg Hx     Prior to Admission medications   Medication Sig Start Date End Date Taking? Authorizing Provider  acetaminophen (TYLENOL) 325 MG tablet Take 650 mg by mouth every 12 (twelve) hours as needed (knee pain).    Yes [provider]  amLODipine (NORVASC) 2.5 MG tablet Take 2.5 mg by mouth daily. 12/12/17  Yes [provider]  Aspirin 81 MG EC tablet Take 81 mg by mouth daily.   Yes [provider]  cholecalciferol (VITAMIN D3) 25 MCG (1000 UNIT) tablet Take 1,000 Units by mouth  daily.   Yes [provider]  Emollient (EUCERIN) lotion Apply 1 mL topically in the morning and at bedtime. Apply 1 application to lower extremities twice daily   Yes [provider]  Ensure (ENSURE) Take 237 mLs by mouth 3 (three) times daily.   Yes [provider]  furosemide (LASIX) 20 MG tablet Take 20 mg by mouth daily. 06/30/20  Yes [provider]  levETIRAcetam (KEPPRA) 500 MG tablet Take 1 tablet (500 mg total) by mouth 2 (two) times daily. 10/15/19  Yes  Melvenia Beam, MD  OLANZapine (ZYPREXA) 2.5 MG tablet Take 2.5 mg by mouth at bedtime.   Yes [provider]  sertraline (ZOLOFT) 100 MG tablet Take 100 mg by mouth daily.   Yes [provider]    Physical Exam: Vitals:   07/12/20 1246 07/12/20 1416 07/12/20 1518 07/12/20 1631  BP: (!) 168/81 (!) 159/68 (!) 161/74 121/70  Pulse: 67 67 71   Resp: 20 20 18 20   Temp: (!) 97.5 F (36.4 C)     SpO2: 99% 99% 99% 100%    Constitutional: NAD, calm, comfortable.  Elderly female lying in bed.  Poor historian.  Confused to time. Vitals:   07/12/20 1246 07/12/20 1416 07/12/20 1518 07/12/20 1631  BP: (!) 168/81 (!) 159/68 (!) 161/74 121/70  Pulse: 67 67 71   Resp: 20 20 18 20   Temp: (!) 97.5 F (36.4 C)     SpO2: 99% 99% 99% 100%   Eyes: PERRL, lids and conjunctivae normal ENMT: Mucous membranes are moist. Posterior pharynx clear of any exudate or lesions. Neck: normal, supple, no masses, no thyromegaly Respiratory: bilateral decreased breath sounds at bases, no wheezing, no crackles. Normal respiratory effort. No accessory muscle use.  Cardiovascular: S1 S2 positive, rate controlled. No extremity edema. 2+ pedal pulses.  Abdomen: no tenderness, no masses palpated. No hepatosplenomegaly. Bowel sounds positive.  Musculoskeletal: no clubbing / cyanosis. No joint deformity upper and lower extremities.  Back: No lumbosacral tenderness. Skin: no rashes, lesions, ulcers. No induration Neurologic: CN 2-12 grossly intact. Moving extremities. No focal neurologic deficits.  Psychiatric: Could not be assessed because of mental status.  Confused to time.  Labs on Admission: I have personally reviewed following labs and imaging studies  CBC: Recent Labs  Lab 07/12/20 1415  WBC 6.7  NEUTROABS 4.2  HGB 5.9*  HCT 20.4*  MCV 82.3  PLT 678   Basic Metabolic Panel: Recent Labs  Lab 07/12/20 1415  NA 143  K 4.3  CL 106  CO2 26  GLUCOSE 107*  BUN 21  CREATININE 1.46*   CALCIUM 9.7   GFR: CrCl cannot be calculated (Unknown ideal weight.). Liver Function Tests: No results for input(s): AST, ALT, ALKPHOS, BILITOT, PROT, ALBUMIN in the last 168 hours. No results for input(s): LIPASE, AMYLASE in the last 168 hours. No results for input(s): AMMONIA in the last 168 hours. Coagulation Profile: No results for input(s): INR, PROTIME in the last 168 hours. Cardiac Enzymes: No results for input(s): CKTOTAL, CKMB, CKMBINDEX, TROPONINI in the last 168 hours. BNP (last 3 results) No results for input(s): PROBNP in the last 8760 hours. HbA1C: No results for input(s): HGBA1C in the last 72 hours. CBG: No results for input(s): GLUCAP in the last 168 hours. Lipid Profile: No results for input(s): CHOL, HDL, LDLCALC, TRIG, CHOLHDL, LDLDIRECT in the last 72 hours. Thyroid Function Tests: No results for input(s): TSH, T4TOTAL, FREET4, T3FREE, THYROIDAB in the last 72 hours. Anemia Panel:  No results for input(s): VITAMINB12, FOLATE, FERRITIN, TIBC, IRON, RETICCTPCT in the last 72 hours. Urine analysis:    Component Value Date/Time   COLORURINE STRAW (A) 07/12/2020 1606   APPEARANCEUR CLEAR 07/12/2020 1606   LABSPEC 1.011 07/12/2020 1606   PHURINE 6.0 07/12/2020 1606   GLUCOSEU NEGATIVE 07/12/2020 1606   HGBUR NEGATIVE 07/12/2020 1606   BILIRUBINUR NEGATIVE 07/12/2020 1606   KETONESUR NEGATIVE 07/12/2020 1606   PROTEINUR 30 (A) 07/12/2020 1606   NITRITE NEGATIVE 07/12/2020 1606   LEUKOCYTESUR NEGATIVE 07/12/2020 1606    Radiological Exams on Admission: DG Chest 2 View  Result Date: 07/12/2020 CLINICAL DATA:  Recent fall.  Hypertension EXAM: CHEST - 2 VIEW COMPARISON:  Nov 22, 2019 FINDINGS: The lungs are clear. Heart is upper normal in size with pulmonary vascularity normal. No adenopathy. No pneumothorax. There is degenerative change in the lower thoracic spine and in each shoulder. IMPRESSION: Lungs clear.  Heart upper normal in size. Electronically Signed    By: Lowella Grip III M.D.   On: 07/12/2020 14:05   DG Knee 2 Views Right  Addendum Date: 07/12/2020   ADDENDUM REPORT: 07/12/2020 14:11 CORRECT EXAM: PELVIS AND RIGHT HIP: 2+ VIEW Electronically Signed   By: Lowella Grip III M.D.   On: 07/12/2020 14:11   Result Date: 07/12/2020 CLINICAL DATA:  Pain following fall EXAM: RIGHT KNEE - 1-2 VIEW COMPARISON:  June 26, 2018 FINDINGS: Frontal pelvis as well as frontal and lateral right hip images were obtained. Bones osteoporotic. No acute fracture or dislocation. There is moderate symmetric narrowing of each hip joint. No erosive change. There is degenerative change in the lower lumbar spine. There are multiple foci of arterial vascular calcification in the pelvis and thigh regions. IMPRESSION: Symmetric narrowing of each hip joint. No acute fracture or dislocation. Bones osteoporotic. Multiple foci of arterial vascular calcification noted. Electronically Signed: By: Lowella Grip III M.D. On: 07/12/2020 14:07   CT Head Wo Contrast  Result Date: 07/12/2020 CLINICAL DATA:  [place] today with trauma to the head and neck. EXAM: CT HEAD WITHOUT CONTRAST CT CERVICAL SPINE WITHOUT CONTRAST TECHNIQUE: Multidetector CT imaging of the head and cervical spine was performed following the standard protocol without intravenous contrast. Multiplanar CT image reconstructions of the cervical spine were also generated. COMPARISON:  08/27/2019 MRI. FINDINGS: CT HEAD FINDINGS Brain: Generalized atrophy, with temporal lobe predominance. Mild chronic small-vessel ischemic changes of the white matter. No sign of acute infarction, mass lesion, hemorrhage, hydrocephalus or extra-axial collection. Vascular: There is atherosclerotic calcification of the major vessels at the base of the brain. Skull: Chronic mild old appearance of the calvarium which could be due to demineralization, anemia or infiltrating marrow process. Lack of progressive change since January of this  year argues against an aggressive process. History describes anemia and vitamin-D deficiency, which could relate to this finding. Sinuses/Orbits: Clear/normal Other: None CT CERVICAL SPINE FINDINGS Alignment: Curvature convex to the right. 2 mm degenerative anterolisthesis at C5-6. Skull base and vertebrae: No fracture. Mild appearance of the bone, similar to the calvarium, though not as pronounced. Soft tissues and spinal canal: No soft tissue neck lesion seen. Disc levels: Chronic degenerative spondylosis from C3-4 through C6-7. No apparent significant canal stenosis. Facet osteoarthritis most pronounced on the right at C5-6 and on the left at C2-3, C3-4 and C4-5. No severe bony foraminal stenosis. Upper chest: Negative Other: None IMPRESSION: HEAD CT: No acute or traumatic finding. Atrophy and chronic small-vessel ischemic changes. Atrophy is temporal lobe predominant.  CERVICAL SPINE CT: No acute or traumatic finding. Chronic degenerative spondylosis and facet osteoarthritis. Electronically Signed   By: Nelson Chimes M.D.   On: 07/12/2020 13:48   CT Cervical Spine Wo Contrast  Result Date: 07/12/2020 CLINICAL DATA:  [place] today with trauma to the head and neck. EXAM: CT HEAD WITHOUT CONTRAST CT CERVICAL SPINE WITHOUT CONTRAST TECHNIQUE: Multidetector CT imaging of the head and cervical spine was performed following the standard protocol without intravenous contrast. Multiplanar CT image reconstructions of the cervical spine were also generated. COMPARISON:  08/27/2019 MRI. FINDINGS: CT HEAD FINDINGS Brain: Generalized atrophy, with temporal lobe predominance. Mild chronic small-vessel ischemic changes of the white matter. No sign of acute infarction, mass lesion, hemorrhage, hydrocephalus or extra-axial collection. Vascular: There is atherosclerotic calcification of the major vessels at the base of the brain. Skull: Chronic mild old appearance of the calvarium which could be due to demineralization, anemia or  infiltrating marrow process. Lack of progressive change since January of this year argues against an aggressive process. History describes anemia and vitamin-D deficiency, which could relate to this finding. Sinuses/Orbits: Clear/normal Other: None CT CERVICAL SPINE FINDINGS Alignment: Curvature convex to the right. 2 mm degenerative anterolisthesis at C5-6. Skull base and vertebrae: No fracture. Mild appearance of the bone, similar to the calvarium, though not as pronounced. Soft tissues and spinal canal: No soft tissue neck lesion seen. Disc levels: Chronic degenerative spondylosis from C3-4 through C6-7. No apparent significant canal stenosis. Facet osteoarthritis most pronounced on the right at C5-6 and on the left at C2-3, C3-4 and C4-5. No severe bony foraminal stenosis. Upper chest: Negative Other: None IMPRESSION: HEAD CT: No acute or traumatic finding. Atrophy and chronic small-vessel ischemic changes. Atrophy is temporal lobe predominant. CERVICAL SPINE CT: No acute or traumatic finding. Chronic degenerative spondylosis and facet osteoarthritis. Electronically Signed   By: Nelson Chimes M.D.   On: 07/12/2020 13:48   CT Lumbar Spine Wo Contrast  Result Date: 07/12/2020 CLINICAL DATA:  Status post fall, low back pain EXAM: CT LUMBAR SPINE WITHOUT CONTRAST TECHNIQUE: Multidetector CT imaging of the lumbar spine was performed without intravenous contrast administration. Multiplanar CT image reconstructions were also generated. COMPARISON:  None. FINDINGS: Segmentation: 5 lumbar type vertebrae. Alignment: Grade 1 anterolisthesis of L5 on S1 secondary to bilateral L5 pars interarticularis defects. Osseous fusion across the L5-S1 disc space. Vertebrae: Severe osteopenia. L2 vertebral body compression fracture with approximately 50% height loss and 2 mm of focal central retropulsion without spinal stenosis. No aggressive osseous lesion. Paraspinal and other soft tissues: No acute paraspinal abnormality.  Abdominal aortic atherosclerosis. Disc levels: Degenerative disease with disc height loss at L2-3, L3-4, and L4-5. Osseous fusion of the L5-S1 disc spaces. T12-L1: Broad-based disc osteophyte complex. No foraminal or central canal stenosis. L1-2: Minimal broad-based disc bulge. No foraminal or central canal stenosis. Mild bilateral facet arthropathy. L2-3: Mild broad-based disc bulge. Mild bilateral facet arthropathy. No foraminal or central canal stenosis. L3-4: Broad-based disc bulge. Mild bilateral facet arthropathy. Mild left foraminal stenosis. No right foraminal stenosis. No central canal stenosis. L4-5: Broad-based disc osteophyte complex. Moderate bilateral facet arthropathy. No definite foraminal or central canal stenosis. IMPRESSION: 1. Acute L2 vertebral body compression fracture with approximately 50% height loss and 2 mm of focal central retropulsion without spinal stenosis. 2. Diffuse lumbar spine spondylosis as described above. 3. Aortic Atherosclerosis (ICD10-I70.0). Electronically Signed   By: Kathreen Devoid   On: 07/12/2020 13:54    EKG: Has not been performed in the  ED yet.  Assessment/Plan  Acute L2 compression fracture secondary to possible mechanical fall -CT of lumbar spine showed L2 vertebral body compression fracture with approximately 50% height loss and 2 mm of focal central repulsion without spinal stenosis -ED provider spoke to on-call neurosurgery who stated that she would see the patient and would order lumbar brace.  CT of the head and cervical spine was negative for acute intracranial or cervical spinal injury.  Right knee x-ray was negative for any acute fracture -UA was negative  -COVID-19 test pending -Fall precautions.  PT/OT eval.  Pain management  Anemia -Questionable cause.  Presented with hemoglobin of 5.9.  Last hemoglobin in February 2021 was 9.5.  FOBT negative.  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 -Hold baby aspirin for now  Acute kidney injury on chronic kidney disease stage IIIa -Creatinine 1.46 on presentation.  Will gently hydrate with normal saline at 75 cc an hour  Dementia -Follow cultures.  Monitor mental status.  Continue olanzapine at bedtime along with sertraline.  Outpatient follow-up -Consult palliative care for goals of care discussion.  Hypertension  -Monitor blood pressure.  Resume amlodipine in a.m.  Hold Lasix for now.  History of seizure -No obvious seizure-like activity reported.  Continue Keppra    DVT prophylaxis: SCDs.  No Lovenox/heparin because of anemia Code Status: Full (ED provider asked me/[name]) to confirm full code Family Communication: None at bedside.  I tried calling [name] but she did not pick up Disposition Plan: Possibly back to memory care unit Consults called: Neurosurgery called by ED.  Will consult palliative care Admission status: Observation/MedSurg  Severity of Illness: The appropriate patient status for this patient is OBSERVATION. Observation status is judged to be reasonable and necessary in order to provide the required intensity of service to ensure the patient's safety. The patient's presenting symptoms, physical exam findings, and initial radiographic and laboratory data in the context of their medical condition is felt to place them at decreased risk for further clinical deterioration. Furthermore, it is anticipated that the patient will be medically stable for discharge from the hospital within 2 midnights of admission. The following factors support the patient status of observation.   " The patient's presenting symptoms include fall. " The physical exam findings include altered mental status. " The initial radiographic and laboratory data are anemia, acute kidney injury, L2 fracture.      Aline August MD [facility]  07/12/2020,  5:34 PM

## 2020-07-12 NOTE — ED Notes (Signed)
Blood bank called an advised that on pts blood bank record is showed that she is a Neurosurgeon witness and d/t pt's dementia unable to obtain consent to give blood.  Provider made aware.

## 2020-07-12 NOTE — Consult Note (Signed)
Chief Complaint   Chief Complaint  Patient presents with  . Fall    HPI   Consult requested by: EDP [facility] Reason for consult: L2 fracture  HPI: Shawna Lin is a 86 y.o. female with multiple medical comorbidities including dementia, resides in nursing home, who presented to the ED after a witnessed fall. Due to patient's dementia she is unable to provide any history. Patient was reportedly trying to walk without a walker when she fell. Complained of immediate LBP and knee pain. She underwent work up by EDP and was found to have an L2 compression fracture. A NSY consultation was requested. Plan is for patient to be admitted under [facility] for pain control. She denies any pain currently.   Patient Active Problem List   Diagnosis Date Noted  . L2 vertebral fracture ([place]) 07/12/2020  . Syncope 08/26/2019  . Recurrent syncope 08/25/2019  . QT prolongation 08/25/2019  . Seizure disorder ([place]) 08/25/2019  . Pain due to onychomycosis of toenails of both feet 01/07/2019  . Dementia ([place]) 12/21/2015  . No blood products 12/21/2015  . 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 Memory loss 09/02/2015  . HTN (hypertension) 09/02/2015    PMH: Past Medical History:  Diagnosis Date  . Hypertension   . Iron deficiency anemia   . Memory loss   . Vitamin D deficiency     PSH: Past Surgical History:  Procedure Laterality Date  . UTERINE FIBROID SURGERY      (Not in a hospital admission)   SH: Social History   Tobacco Use  . Smoking status: Never Smoker  . Smokeless tobacco: Never Used  Vaping Use  . Vaping Use: Never used  Substance Use Topics  . Alcohol use: No    Alcohol/week: 0.0 standard drinks  . Drug use: No    MEDS: Prior to Admission medications   Medication Sig Start Date End Date Taking? Authorizing Provider  acetaminophen (TYLENOL) 325 MG tablet Take 650 mg by mouth every 12 (twelve) hours as needed (knee pain).    Yes [provider]  amLODipine (NORVASC) 2.5 MG  tablet Take 2.5 mg by mouth daily. 12/12/17  Yes [provider]  Aspirin 81 MG EC tablet Take 81 mg by mouth daily.   Yes [provider]  cholecalciferol (VITAMIN D3) 25 MCG (1000 UNIT) tablet Take 1,000 Units by mouth daily.   Yes [provider]  Emollient (EUCERIN) lotion Apply 1 mL topically in the morning and at bedtime. Apply 1 application to lower extremities twice daily   Yes [provider]  Ensure (ENSURE) Take 237 mLs by mouth 3 (three) times daily.   Yes [provider]  furosemide (LASIX) 20 MG tablet Take 20 mg by mouth daily. 06/30/20  Yes [provider]  levETIRAcetam (KEPPRA) 500 MG tablet Take 1 tablet (500 mg total) by mouth 2 (two) times daily. 10/15/19  Yes Melvenia Beam, MD  OLANZapine (ZYPREXA) 2.5 MG tablet Take 2.5 mg by mouth at bedtime.   Yes [provider]  sertraline (ZOLOFT) 100 MG tablet Take 100 mg by mouth daily.   Yes [provider]    ALLERGY: No Known Allergies  Social History   Tobacco Use  . Smoking status: Never Smoker  . Smokeless tobacco: Never Used  Substance Use Topics  . Alcohol use: No    Alcohol/week: 0.0 standard drinks     Family History  Problem Relation Age of Onset  . Heart attack Father   .  Hypertension Mother   . Colon cancer Brother   . Hypertension Brother   . Diabetes Sister   . Kidney failure Sister   . Hypertension Sister   . Dementia Neg Hx      ROS   ROS unable to perform, dementia  Exam   Vitals:   07/12/20 1518 07/12/20 1631  BP: (!) 161/74 121/70  Pulse: 71   Resp: 18 20  Temp:    SpO2: 99% 100%   General appearance: elderly female, resting comfortably Eyes: No scleral injection Cardiovascular: Regular rate and rhythm without murmurs, rubs, gallops. No edema or variciosities. Distal pulses normal. Pulmonary: Effort normal, non-labored breathing Musculoskeletal:     Muscle tone upper extremities: Normal    Muscle tone lower  extremities: Normal    Motor exam: Upper Extremities Deltoid Bicep Tricep Grip  Right 5/5 5/5 5/5 5/5  Left 5/5 5/5 5/5 5/5   Lower Extremity IP Quad PF DF EHL  Right 5/5 5/5 5/5 5/5 5/5  Left 5/5 5/5 5/5 5/5 5/5   Neurological Mental Status:    - Patient is awake, alert, oriented to person, place, month, year, and situation    - Patient is able to give a clear and coherent history.    - No signs of aphasia or neglect Cranial Nerves    - II: Visual Fields are full. PERRL    - III/IV/VI: EOMI without ptosis or diploplia.     - V: Facial sensation is grossly normal    - VII: Facial movement is symmetric.     - VIII: hearing is intact to voice    - X: Uvula elevates symmetrically    - XI: Shoulder shrug is symmetric.    - XII: tongue is midline without atrophy or fasciculations.  Sensory: Sensation grossly intact to LT  Results - Imaging/Labs   Results for orders placed or performed during the hospital encounter of 07/12/20 (from the past 48 hour(s))  CBC with Differential     Status: Abnormal   Collection Time: 07/12/20  2:15 PM  Result Value Ref Range   WBC 6.7 4.0 - 10.5 K/uL   RBC 2.48 (L) 3.87 - 5.11 MIL/uL   Hemoglobin 5.9 (LL) 12.0 - 15.0 g/dL    Comment: REPEATED TO VERIFY THIS CRITICAL RESULT HAS VERIFIED AND BEEN CALLED TO BESS, STEPHANIE,RN BY TURNER,SHAWANA ON 12 28 2021 AT 1455, AND HAS BEEN READ BACK.     HCT 20.4 (L) 36.0 - 46.0 %   MCV 82.3 80.0 - 100.0 fL   MCH 23.8 (L) 26.0 - 34.0 pg   MCHC 28.9 (L) 30.0 - 36.0 g/dL   RDW 22.3 (H) 11.5 - 15.5 %   Platelets 180 150 - 400 K/uL   nRBC 2.2 (H) 0.0 - 0.2 %   Neutrophils Relative % 62 %   Neutro Abs 4.2 1.7 - 7.7 K/uL   Lymphocytes Relative 21 %   Lymphs Abs 1.4 0.7 - 4.0 K/uL   Monocytes Relative 12 %   Monocytes Absolute 0.8 0.1 - 1.0 K/uL   Eosinophils Relative 3 %   Eosinophils Absolute 0.2 0.0 - 0.5 K/uL   Basophils Relative 0 %   Basophils Absolute 0.0 0.0 - 0.1 K/uL   Immature Granulocytes 2 %    Abs Immature Granulocytes 0.10 (H) 0.00 - 0.07 K/uL   Schistocytes PRESENT    Tear Drop Cells PRESENT    Polychromasia PRESENT    Target Cells PRESENT     Comment: Performed  at [facility], [place] [address] [place], [place] [address]  Basic metabolic panel     Status: Abnormal   Collection Time: 07/12/20  2:15 PM  Result Value Ref Range   Sodium 143 135 - 145 mmol/L   Potassium 4.3 3.5 - 5.1 mmol/L   Chloride 106 98 - 111 mmol/L   CO2 26 22 - 32 mmol/L   Glucose, Bld 107 (H) 70 - 99 mg/dL    Comment: Glucose reference range applies only to samples taken after fasting for at least 8 hours.   BUN 21 8 - 23 mg/dL   Creatinine, Ser 1.46 (H) 0.44 - 1.00 mg/dL   Calcium 9.7 8.9 - 10.3 mg/dL   GFR, Estimated 34 (L) >60 mL/min    Comment: (NOTE) Calculated using the CKD-EPI Creatinine Equation (2021)    Anion gap 11 5 - 15    Comment: Performed at [facility], [place] [address] [place], [place] [address]  Urinalysis, Routine w reflex microscopic Urine, Clean Catch     Status: Abnormal   Collection Time: 07/12/20  4:06 PM  Result Value Ref Range   Color, Urine STRAW (A) YELLOW   APPearance CLEAR CLEAR   Specific Gravity, Urine 1.011 1.005 - 1.030   pH 6.0 5.0 - 8.0   Glucose, UA NEGATIVE NEGATIVE mg/dL   Hgb urine dipstick NEGATIVE NEGATIVE   Bilirubin Urine NEGATIVE NEGATIVE   Ketones, ur NEGATIVE NEGATIVE mg/dL   Protein, ur 30 (A) NEGATIVE mg/dL   Nitrite NEGATIVE NEGATIVE   Leukocytes,Ua NEGATIVE NEGATIVE   RBC / HPF 0-5 0 - 5 RBC/hpf   WBC, UA 0-5 0 - 5 WBC/hpf   Bacteria, UA RARE (A) NONE SEEN   Squamous Epithelial / LPF 0-5 0 - 5   Mucus PRESENT     Comment: Performed at [facility], [place] [address] [place], [place] [address]  POC occult blood, ED RN will collect     Status: None   Collection Time: 07/12/20  4:24 PM  Result Value Ref Range   Fecal Occult Bld NEGATIVE NEGATIVE    DG Chest 2 View  Result Date:  07/12/2020 CLINICAL DATA:  Recent fall.  Hypertension EXAM: CHEST - 2 VIEW COMPARISON:  Nov 22, 2019 FINDINGS: The lungs are clear. Heart is upper normal in size with pulmonary vascularity normal. No adenopathy. No pneumothorax. There is degenerative change in the lower thoracic spine and in each shoulder. IMPRESSION: Lungs clear.  Heart upper normal in size. Electronically Signed   By: Lowella Grip III M.D.   On: 07/12/2020 14:05   DG Knee 2 Views Right  Addendum Date: 07/12/2020   ADDENDUM REPORT: 07/12/2020 14:11 CORRECT EXAM: PELVIS AND RIGHT HIP: 2+ VIEW Electronically Signed   By: Lowella Grip III M.D.   On: 07/12/2020 14:11   Result Date: 07/12/2020 CLINICAL DATA:  Pain following fall EXAM: RIGHT KNEE - 1-2 VIEW COMPARISON:  June 26, 2018 FINDINGS: Frontal pelvis as well as frontal and lateral right hip images were obtained. Bones osteoporotic. No acute fracture or dislocation. There is moderate symmetric narrowing of each hip joint. No erosive change. There is degenerative change in the lower lumbar spine. There are multiple foci of arterial vascular calcification in the pelvis and thigh regions. IMPRESSION: Symmetric narrowing of each hip joint. No acute fracture or dislocation. Bones osteoporotic. Multiple foci of arterial vascular calcification noted. Electronically Signed: By: Lowella Grip III M.D. On: 07/12/2020 14:07   CT  Head Wo Contrast  Result Date: 07/12/2020 CLINICAL DATA:  [place] today with trauma to the head and neck. EXAM: CT HEAD WITHOUT CONTRAST CT CERVICAL SPINE WITHOUT CONTRAST TECHNIQUE: Multidetector CT imaging of the head and cervical spine was performed following the standard protocol without intravenous contrast. Multiplanar CT image reconstructions of the cervical spine were also generated. COMPARISON:  08/27/2019 MRI. FINDINGS: CT HEAD FINDINGS Brain: Generalized atrophy, with temporal lobe predominance. Mild chronic small-vessel ischemic changes of the  white matter. No sign of acute infarction, mass lesion, hemorrhage, hydrocephalus or extra-axial collection. Vascular: There is atherosclerotic calcification of the major vessels at the base of the brain. Skull: Chronic mild old appearance of the calvarium which could be due to demineralization, anemia or infiltrating marrow process. Lack of progressive change since January of this year argues against an aggressive process. History describes anemia and vitamin-D deficiency, which could relate to this finding. Sinuses/Orbits: Clear/normal Other: None CT CERVICAL SPINE FINDINGS Alignment: Curvature convex to the right. 2 mm degenerative anterolisthesis at C5-6. Skull base and vertebrae: No fracture. Mild appearance of the bone, similar to the calvarium, though not as pronounced. Soft tissues and spinal canal: No soft tissue neck lesion seen. Disc levels: Chronic degenerative spondylosis from C3-4 through C6-7. No apparent significant canal stenosis. Facet osteoarthritis most pronounced on the right at C5-6 and on the left at C2-3, C3-4 and C4-5. No severe bony foraminal stenosis. Upper chest: Negative Other: None IMPRESSION: HEAD CT: No acute or traumatic finding. Atrophy and chronic small-vessel ischemic changes. Atrophy is temporal lobe predominant. CERVICAL SPINE CT: No acute or traumatic finding. Chronic degenerative spondylosis and facet osteoarthritis. Electronically Signed   By: Nelson Chimes M.D.   On: 07/12/2020 13:48   CT Cervical Spine Wo Contrast  Result Date: 07/12/2020 CLINICAL DATA:  [place] today with trauma to the head and neck. EXAM: CT HEAD WITHOUT CONTRAST CT CERVICAL SPINE WITHOUT CONTRAST TECHNIQUE: Multidetector CT imaging of the head and cervical spine was performed following the standard protocol without intravenous contrast. Multiplanar CT image reconstructions of the cervical spine were also generated. COMPARISON:  08/27/2019 MRI. FINDINGS: CT HEAD FINDINGS Brain: Generalized atrophy, with  temporal lobe predominance. Mild chronic small-vessel ischemic changes of the white matter. No sign of acute infarction, mass lesion, hemorrhage, hydrocephalus or extra-axial collection. Vascular: There is atherosclerotic calcification of the major vessels at the base of the brain. Skull: Chronic mild old appearance of the calvarium which could be due to demineralization, anemia or infiltrating marrow process. Lack of progressive change since January of this year argues against an aggressive process. History describes anemia and vitamin-D deficiency, which could relate to this finding. Sinuses/Orbits: Clear/normal Other: None CT CERVICAL SPINE FINDINGS Alignment: Curvature convex to the right. 2 mm degenerative anterolisthesis at C5-6. Skull base and vertebrae: No fracture. Mild appearance of the bone, similar to the calvarium, though not as pronounced. Soft tissues and spinal canal: No soft tissue neck lesion seen. Disc levels: Chronic degenerative spondylosis from C3-4 through C6-7. No apparent significant canal stenosis. Facet osteoarthritis most pronounced on the right at C5-6 and on the left at C2-3, C3-4 and C4-5. No severe bony foraminal stenosis. Upper chest: Negative Other: None IMPRESSION: HEAD CT: No acute or traumatic finding. Atrophy and chronic small-vessel ischemic changes. Atrophy is temporal lobe predominant. CERVICAL SPINE CT: No acute or traumatic finding. Chronic degenerative spondylosis and facet osteoarthritis. Electronically Signed   By: Nelson Chimes M.D.   On: 07/12/2020 13:48   CT Lumbar Spine Wo  Contrast  Result Date: 07/12/2020 CLINICAL DATA:  Status post fall, low back pain EXAM: CT LUMBAR SPINE WITHOUT CONTRAST TECHNIQUE: Multidetector CT imaging of the lumbar spine was performed without intravenous contrast administration. Multiplanar CT image reconstructions were also generated. COMPARISON:  None. FINDINGS: Segmentation: 5 lumbar type vertebrae. Alignment: Grade 1 anterolisthesis  of L5 on S1 secondary to bilateral L5 pars interarticularis defects. Osseous fusion across the L5-S1 disc space. Vertebrae: Severe osteopenia. L2 vertebral body compression fracture with approximately 50% height loss and 2 mm of focal central retropulsion without spinal stenosis. No aggressive osseous lesion. Paraspinal and other soft tissues: No acute paraspinal abnormality. Abdominal aortic atherosclerosis. Disc levels: Degenerative disease with disc height loss at L2-3, L3-4, and L4-5. Osseous fusion of the L5-S1 disc spaces. T12-L1: Broad-based disc osteophyte complex. No foraminal or central canal stenosis. L1-2: Minimal broad-based disc bulge. No foraminal or central canal stenosis. Mild bilateral facet arthropathy. L2-3: Mild broad-based disc bulge. Mild bilateral facet arthropathy. No foraminal or central canal stenosis. L3-4: Broad-based disc bulge. Mild bilateral facet arthropathy. Mild left foraminal stenosis. No right foraminal stenosis. No central canal stenosis. L4-5: Broad-based disc osteophyte complex. Moderate bilateral facet arthropathy. No definite foraminal or central canal stenosis. IMPRESSION: 1. Acute L2 vertebral body compression fracture with approximately 50% height loss and 2 mm of focal central retropulsion without spinal stenosis. 2. Diffuse lumbar spine spondylosis as described above. 3. Aortic Atherosclerosis (ICD10-I70.0). Electronically Signed   By: Kathreen Devoid   On: 07/12/2020 13:54    Impression/Plan   86 y.o. female who suffered an L2 compression fracture after a mechanical fall earlier today. She is at neurologic baseline with good strength in BLE. She is going to be admitted for pain control under TH.    L2 compression fracture with 50% height loss and minimal retropulsion  - Treat non operatively with LSO bracing when upright and OOB.  - PT/OT to ensure pain is adequately controlled when ambulating - Plan for outpatient f/u in several weeks for monitoring.   Please  call for any concerns.  Ferne Reus, PA-C [facility] [employer]

## 2020-07-12 NOTE — Progress Notes (Signed)
Pt arrived to floor in stable condition. Pt confused though appropriate. Pt was able to verbalize to rn that she does not want blood due to her religious beliefs. Rn will reach out to family to get blood refusal form signed. Assessment performed and vs done. Rn will continue to monitor.

## 2020-07-12 NOTE — ED Notes (Signed)
Called report to Jennifer, RN 

## 2020-07-12 NOTE — ED Triage Notes (Signed)
Presents via EMS from nursing home after witnessed fall. Pt was trying to walk without her walker.  C/o right hip and knee pain and some low back pain.

## 2020-07-12 NOTE — ED Notes (Signed)
Pt to CT.

## 2020-07-12 NOTE — ED Notes (Signed)
Ortho called for vendor brace. Tech stated that he would have to call someone else and get one ordered, that it might not be until tomorrow morning until she received it.

## 2020-07-12 NOTE — Progress Notes (Signed)
Pt family Carlyle Basques) called by Rn to verify that pt did not want blood products. Family was also able to verify this as pt is confused at baseline. Rn will fill out refusal form based on this conversation.

## 2020-07-13 DIAGNOSIS — M25559 Pain in unspecified hip: Secondary | ICD-10-CM | POA: Diagnosis present

## 2020-07-13 DIAGNOSIS — S0990XA Unspecified injury of head, initial encounter: Secondary | ICD-10-CM | POA: Diagnosis present

## 2020-07-13 DIAGNOSIS — D649 Anemia, unspecified: Secondary | ICD-10-CM | POA: Diagnosis not present

## 2020-07-13 DIAGNOSIS — S32020B Wedge compression fracture of second lumbar vertebra, initial encounter for open fracture: Secondary | ICD-10-CM | POA: Diagnosis not present

## 2020-07-13 DIAGNOSIS — S32020A Wedge compression fracture of second lumbar vertebra, initial encounter for closed fracture: Secondary | ICD-10-CM | POA: Diagnosis not present

## 2020-07-13 DIAGNOSIS — S32029A Unspecified fracture of second lumbar vertebra, initial encounter for closed fracture: Secondary | ICD-10-CM | POA: Diagnosis present

## 2020-07-13 DIAGNOSIS — Z531 Procedure and treatment not carried out because of patient's decision for reasons of belief and group pressure: Secondary | ICD-10-CM | POA: Diagnosis present

## 2020-07-13 DIAGNOSIS — Y9301 Activity, walking, marching and hiking: Secondary | ICD-10-CM | POA: Diagnosis present

## 2020-07-13 DIAGNOSIS — S32021B Stable burst fracture of second lumbar vertebra, initial encounter for open fracture: Secondary | ICD-10-CM | POA: Diagnosis not present

## 2020-07-13 DIAGNOSIS — Z23 Encounter for immunization: Secondary | ICD-10-CM | POA: Diagnosis present

## 2020-07-13 DIAGNOSIS — Z6833 Body mass index (BMI) 33.0-33.9, adult: Secondary | ICD-10-CM | POA: Diagnosis not present

## 2020-07-13 DIAGNOSIS — W1830XA Fall on same level, unspecified, initial encounter: Secondary | ICD-10-CM | POA: Diagnosis present

## 2020-07-13 DIAGNOSIS — Z833 Family history of diabetes mellitus: Secondary | ICD-10-CM | POA: Diagnosis not present

## 2020-07-13 DIAGNOSIS — N179 Acute kidney failure, unspecified: Secondary | ICD-10-CM | POA: Diagnosis present

## 2020-07-13 DIAGNOSIS — D631 Anemia in chronic kidney disease: Secondary | ICD-10-CM | POA: Diagnosis present

## 2020-07-13 DIAGNOSIS — Z7189 Other specified counseling: Secondary | ICD-10-CM

## 2020-07-13 DIAGNOSIS — S32021D Stable burst fracture of second lumbar vertebra, subsequent encounter for fracture with routine healing: Secondary | ICD-10-CM | POA: Diagnosis not present

## 2020-07-13 DIAGNOSIS — D500 Iron deficiency anemia secondary to blood loss (chronic): Secondary | ICD-10-CM | POA: Diagnosis not present

## 2020-07-13 DIAGNOSIS — Z8249 Family history of ischemic heart disease and other diseases of the circulatory system: Secondary | ICD-10-CM | POA: Diagnosis not present

## 2020-07-13 DIAGNOSIS — R531 Weakness: Secondary | ICD-10-CM | POA: Diagnosis not present

## 2020-07-13 DIAGNOSIS — R9431 Abnormal electrocardiogram [ECG] [EKG]: Secondary | ICD-10-CM | POA: Diagnosis present

## 2020-07-13 DIAGNOSIS — Z7982 Long term (current) use of aspirin: Secondary | ICD-10-CM | POA: Diagnosis not present

## 2020-07-13 DIAGNOSIS — I129 Hypertensive chronic kidney disease with stage 1 through stage 4 chronic kidney disease, or unspecified chronic kidney disease: Secondary | ICD-10-CM | POA: Diagnosis present

## 2020-07-13 DIAGNOSIS — Z515 Encounter for palliative care: Secondary | ICD-10-CM

## 2020-07-13 DIAGNOSIS — E559 Vitamin D deficiency, unspecified: Secondary | ICD-10-CM | POA: Diagnosis present

## 2020-07-13 DIAGNOSIS — D696 Thrombocytopenia, unspecified: Secondary | ICD-10-CM | POA: Diagnosis present

## 2020-07-13 DIAGNOSIS — S32020D Wedge compression fracture of second lumbar vertebra, subsequent encounter for fracture with routine healing: Secondary | ICD-10-CM | POA: Diagnosis not present

## 2020-07-13 DIAGNOSIS — W2209XA Striking against other stationary object, initial encounter: Secondary | ICD-10-CM | POA: Diagnosis present

## 2020-07-13 DIAGNOSIS — Z841 Family history of disorders of kidney and ureter: Secondary | ICD-10-CM | POA: Diagnosis not present

## 2020-07-13 DIAGNOSIS — F0390 Unspecified dementia without behavioral disturbance: Secondary | ICD-10-CM | POA: Diagnosis present

## 2020-07-13 DIAGNOSIS — E669 Obesity, unspecified: Secondary | ICD-10-CM | POA: Diagnosis present

## 2020-07-13 DIAGNOSIS — N1831 Chronic kidney disease, stage 3a: Secondary | ICD-10-CM | POA: Diagnosis present

## 2020-07-13 DIAGNOSIS — Y92129 Unspecified place in nursing home as the place of occurrence of the external cause: Secondary | ICD-10-CM | POA: Diagnosis not present

## 2020-07-13 DIAGNOSIS — Z800 Family history of malignant neoplasm of digestive organs: Secondary | ICD-10-CM | POA: Diagnosis not present

## 2020-07-13 DIAGNOSIS — Z20822 Contact with and (suspected) exposure to covid-19: Secondary | ICD-10-CM | POA: Diagnosis present

## 2020-07-13 DIAGNOSIS — D513 Other dietary vitamin B12 deficiency anemia: Secondary | ICD-10-CM | POA: Diagnosis present

## 2020-07-13 DIAGNOSIS — W19XXXA Unspecified fall, initial encounter: Secondary | ICD-10-CM | POA: Diagnosis not present

## 2020-07-13 DIAGNOSIS — G40909 Epilepsy, unspecified, not intractable, without status epilepticus: Secondary | ICD-10-CM | POA: Diagnosis present

## 2020-07-13 DIAGNOSIS — Z79899 Other long term (current) drug therapy: Secondary | ICD-10-CM | POA: Diagnosis not present

## 2020-07-13 LAB — COMPREHENSIVE METABOLIC PANEL
ALT: 14 U/L (ref 0–44)
AST: 26 U/L (ref 15–41)
Albumin: 3.9 g/dL (ref 3.5–5.0)
Alkaline Phosphatase: 129 U/L — ABNORMAL HIGH (ref 38–126)
Anion gap: 10 (ref 5–15)
BUN: 20 mg/dL (ref 8–23)
CO2: 24 mmol/L (ref 22–32)
Calcium: 9.5 mg/dL (ref 8.9–10.3)
Chloride: 108 mmol/L (ref 98–111)
Creatinine, Ser: 1.48 mg/dL — ABNORMAL HIGH (ref 0.44–1.00)
GFR, Estimated: 34 mL/min — ABNORMAL LOW (ref >60)
Glucose, Bld: 91 mg/dL (ref 70–99)
Potassium: 4.8 mmol/L (ref 3.5–5.1)
Sodium: 142 mmol/L (ref 135–145)
Total Bilirubin: 0.3 mg/dL (ref 0.3–1.2)
Total Protein: 6.2 g/dL — ABNORMAL LOW (ref 6.5–8.1)

## 2020-07-13 LAB — VITAMIN B12: Vitamin B-12: 196 pg/mL (ref 180–914)

## 2020-07-13 LAB — IRON AND TIBC
Iron: 34 ug/dL (ref 28–170)
Saturation Ratios: 8 % — ABNORMAL LOW (ref 10.4–31.8)
TIBC: 407 ug/dL (ref 250–450)
UIBC: 373 ug/dL

## 2020-07-13 LAB — CBC
HCT: 20.1 % — ABNORMAL LOW (ref 36.0–46.0)
Hemoglobin: 5.7 g/dL — CL (ref 12.0–15.0)
MCH: 23.8 pg — ABNORMAL LOW (ref 26.0–34.0)
MCHC: 28.4 g/dL — ABNORMAL LOW (ref 30.0–36.0)
MCV: 83.8 fL (ref 80.0–100.0)
Platelets: 186 10*3/uL (ref 150–400)
RBC: 2.4 MIL/uL — ABNORMAL LOW (ref 3.87–5.11)
RDW: 22.8 % — ABNORMAL HIGH (ref 11.5–15.5)
WBC: 8.8 10*3/uL (ref 4.0–10.5)
nRBC: 2.6 % — ABNORMAL HIGH (ref 0.0–0.2)

## 2020-07-13 LAB — NO BLOOD PRODUCTS

## 2020-07-13 LAB — FOLATE: Folate: 15.3 ng/mL (ref >5.9)

## 2020-07-13 LAB — TSH: TSH: 5.409 u[IU]/mL — ABNORMAL HIGH (ref 0.350–4.500)

## 2020-07-13 MED ORDER — HYDROCERIN EX CREA
TOPICAL_CREAM | Freq: Two times a day (BID) | CUTANEOUS | Status: DC
  Administered 2020-07-17 – 2020-07-19 (×2): 1 via TOPICAL
  Filled 2020-07-13: qty 113

## 2020-07-13 MED ORDER — EUCERIN EX LOTN: 1.0000 mL | TOPICAL_LOTION | Freq: Two times a day (BID) | CUTANEOUS | Status: DC

## 2020-07-13 MED ORDER — ACETAMINOPHEN 500 MG PO TABS
500.0000 mg | ORAL_TABLET | Freq: Two times a day (BID) | ORAL | Status: DC
  Administered 2020-07-13 – 2020-07-23 (×21): 500 mg via ORAL
  Filled 2020-07-13 (×21): qty 1

## 2020-07-13 MED ORDER — AMLODIPINE BESYLATE 5 MG PO TABS
2.5000 mg | ORAL_TABLET | Freq: Once | ORAL | Status: AC
  Administered 2020-07-14: 01:00:00 2.5 mg via ORAL
  Filled 2020-07-13: qty 1

## 2020-07-13 MED ORDER — CYANOCOBALAMIN 1000 MCG/ML IJ SOLN
1000.0000 ug | Freq: Every day | INTRAMUSCULAR | Status: AC
  Administered 2020-07-14 – 2020-07-19 (×6): 1000 ug via SUBCUTANEOUS
  Filled 2020-07-13 (×7): qty 1

## 2020-07-13 MED ORDER — AMLODIPINE BESYLATE 5 MG PO TABS
5.0000 mg | ORAL_TABLET | Freq: Every day | ORAL | Status: DC
  Administered 2020-07-14 – 2020-07-17 (×4): 5 mg via ORAL
  Filled 2020-07-13 (×4): qty 1

## 2020-07-13 MED ORDER — SENNOSIDES-DOCUSATE SODIUM 8.6-50 MG PO TABS
1.0000 | ORAL_TABLET | Freq: Every day | ORAL | Status: DC
  Administered 2020-07-14 – 2020-07-22 (×10): 1 via ORAL
  Filled 2020-07-13 (×10): qty 1

## 2020-07-13 NOTE — Progress Notes (Signed)
Occupational Therapy Evaluation  Patient is pleasantly confused, unable to recall where she is or why she is in the hospital. Per chart review patient from memory care unit and uses walker at baseline. Currently patient requiring max A for LB dressing due to acute L2 fx, increased pain with mobility and min A for stand pivot transfer to/from bedside commode. Recommend continued acute OT services to maximize patient safety, balance, activity tolerance in order to facilitate D/C to venue listed below.    07/13/20 1400  OT Visit Information  Last OT Received On 07/13/20  Assistance Needed +1  History of Present Illness 86 y.o. female with medical history significant of dementia, hypertension, seizure disorder, prolonged QTC, vitamin D deficiency who presented from the facility after witnessed mechanical fall.  Pt admitted for acute L2 compression fracture secondary to possible mechanical fall and anemia  Precautions  Precautions Fall  Required Braces or Orthoses Spinal Brace  Spinal Brace Applied in sitting position;Lumbar corset (LSO)  Restrictions  Weight Bearing Restrictions No  Home Living  Family/patient expects to be discharged to: Assisted living  [place] - 2 wheels;Shower seat - built in  Prior Function  Level of Independence Needs assistance  Gait / Transfers Assistance Needed from memory care unit, pt reports being ambulatory however presents with cognitive deficits  Communication  Communication No difficulties  Pain Assessment  Pain Assessment Faces  Faces Pain Scale 4  Pain Location R hip/groin area  Pain Descriptors / Indicators Grimacing  Pain Intervention(s) Patient requesting pain meds-RN notified  Cognition  Arousal/Alertness Awake/alert  Behavior During Therapy WFL for tasks assessed/performed  Overall Cognitive Status No family/caregiver present to determine baseline cognitive functioning  Area of Impairment Orientation;Safety/judgement  Orientation  Level Disoriented to;Place;Time;Situation  Safety/Judgement Decreased awareness of safety;Decreased awareness of deficits  General Comments hx dementia, pt cooperative and following simple commands  Upper Extremity Assessment  Upper Extremity Assessment Generalized weakness  Lower Extremity Assessment  Lower Extremity Assessment Defer to PT evaluation  ADL  Overall ADL's  Needs assistance/impaired  Eating/Feeding Independent;Sitting  Grooming Set up;Sitting  Upper Body Bathing Set up;Supervision/ safety;Sitting  Lower Body Bathing Maximal assistance;Sitting/lateral leans;Sit to/from stand  Upper Body Dressing  Supervision/safety;Set up;Sitting  Lower Body Dressing Maximal assistance;Sitting/lateral leans;Sit to/from Retail buyer Minimal assistance;Stand-pivot;Cueing for Camera operator Details (indicate cue type and reason) hand held assistance x1 for steadying, min cues for safety with hand placement, increased pain in R LE with mobility RN made aware  Toileting- Clothing Manipulation and Hygiene Supervision/safety;Sitting/lateral lean  Functional mobility during ADLs Minimal assistance  General ADL Comments unsure of patient's baseline however patient currently requiring set up/S for UB ADL, max A for LB ADL due to acute L2 fx/pain and min A for functional transfers  Bed Mobility  General bed mobility comments in recliner  Transfers  Overall transfer level Needs assistance  Equipment used 1 person hand held assist  Transfers Sit to/from [employer] Transfers  Sit to Stand Min assist  Stand pivot transfers Min assist  General transfer comment please see toliet transfer in ADL section  Balance  Overall balance assessment History of Falls;Needs assistance  Sitting-balance support Feet supported  Sitting balance-Leahy Scale Fair  Standing balance support Single extremity supported  Standing balance-Leahy Scale Poor  Standing balance comment reliant on UE  support  OT - End of Session  Equipment Utilized During Treatment Back brace  Activity Tolerance Patient tolerated treatment well  Patient left in chair;with call bell/phone  within reach;with chair alarm set  Nurse Communication Mobility status;Patient requests pain meds  OT Assessment  OT Recommendation/Assessment Patient needs continued OT Services  OT Visit Diagnosis Other abnormalities of gait and mobility (R26.89);Unsteadiness on feet (R26.81);History of falling (Z91.81);Pain  Pain - Right/Left Right  Pain - part of body Hip  OT Problem List Decreased activity tolerance;Impaired balance (sitting and/or standing);Decreased cognition;Decreased safety awareness;Decreased knowledge of precautions;Pain  OT Plan  OT Frequency (ACUTE ONLY) Min 2X/week  OT Treatment/Interventions (ACUTE ONLY) Self-care/ADL training;DME and/or AE instruction;Therapeutic activities;Cognitive remediation/compensation;Patient/family education;Balance training  AM-PAC OT "6 Clicks" Daily Activity Outcome Measure (Version 2)  Help from another person eating meals? 4  Help from another person taking care of personal grooming? 3  Help from another person toileting, which includes using toliet, bedpan, or urinal? 3  Help from another person bathing (including washing, rinsing, drying)? 2  Help from another person to put on and taking off regular upper body clothing? 3  Help from another person to put on and taking off regular lower body clothing? 2  6 Click Score 17  OT Recommendation  Follow Up Recommendations SNF  OT Equipment None recommended by OT  Individuals Consulted  Consulted and Agree with Results and Recommendations Patient  Acute Rehab OT Goals  Patient Stated Goal to eat lunch  OT Goal Formulation With patient  Time For Goal Achievement 07/27/20  Potential to Achieve Goals Good  OT Time Calculation  OT Start Time (ACUTE ONLY) 1250  OT Stop Time (ACUTE ONLY) 1308  OT Time Calculation (min) 18 min   OT General Charges  $OT Visit 1 Visit  OT Evaluation  $OT Eval Low Complexity 1 Low  Written Expression  Dominant Hand Right   Delbert Phenix OT OT pager: [phone]

## 2020-07-13 NOTE — Evaluation (Signed)
Physical Therapy Evaluation Patient Details Name: Shawna Lin MRN: [id number] DOB: [birth year] Today's Date: 07/13/2020   History of Present Illness  86 y.o. female with medical history significant of dementia, hypertension, seizure disorder, prolonged QTC, vitamin D deficiency who presented from the facility after witnessed mechanical fall.  Pt admitted for acute L2 compression fracture secondary to possible mechanical fall and anemia  Clinical Impression  Pt admitted with above diagnosis.  Pt currently with functional limitations due to the deficits listed below (see PT Problem List). Pt will benefit from skilled PT to increase their independence and safety with mobility to allow discharge to the venue listed below.  Pt assisted OOB to recliner.   Pt not orientated but cooperative.  Pt with LSO in bed and adjusted with pt in sitting.  Pt would benefit from SNF upon d/c, unless ALF can provide current level of assist.     Follow Up Recommendations SNF    Equipment Recommendations  None recommended by PT    Recommendations for Other Services       Precautions / Restrictions Precautions Precautions: Fall Required Braces or Orthoses: Spinal Brace Spinal Brace: Applied in sitting position;Lumbar corset (LSO) Restrictions Weight Bearing Restrictions: No      Mobility  Bed Mobility Overal bed mobility: Needs Assistance Bed Mobility: Supine to Sit     Supine to sit: Min guard;HOB elevated     General bed mobility comments: cues for log roll however pt performing movement on her own with supine to sit, increased time and effort    Transfers Overall transfer level: Needs assistance Equipment used: None Transfers: Sit to/from [facility] Sit to Stand: Min assist Stand pivot transfers: Min assist       General transfer comment: offerred pt RW however she began to stand, utilized armrests of recliner for support, assist to steady  Ambulation/Gait                 Stairs            Wheelchair Mobility    Modified Rankin (Stroke Patients Only)       Balance Overall balance assessment: History of Falls;Needs assistance         Standing balance support: Bilateral upper extremity supported Standing balance-Leahy Scale: Poor Standing balance comment: reliant on UE support                             Pertinent Vitals/Pain Pain Assessment: Faces Faces Pain Scale: Hurts little more Pain Location: back Pain Descriptors / Indicators: Grimacing Pain Intervention(s): Repositioned;Monitored during session    Home Living Family/patient expects to be discharged to:: Assisted living               Home Equipment: Walker - 2 wheels;Shower seat - built in      Prior Function Level of Independence: Needs assistance   Gait / Transfers Assistance Needed: from memory care unit, pt reports being ambulatory however presents with cognitive deficits           Hand Dominance        Extremity/Trunk Assessment        Lower Extremity Assessment Lower Extremity Assessment: Generalized weakness       Communication   Communication: No difficulties  Cognition Arousal/Alertness: Awake/alert Behavior During Therapy: WFL for tasks assessed/performed Overall Cognitive Status: No family/caregiver present to determine baseline cognitive functioning Area of Impairment: Orientation;Safety/judgement  Orientation Level: Disoriented to;Place;Time;Situation       Safety/Judgement: Decreased awareness of safety;Decreased awareness of deficits     General Comments: hx dementia, pt cooperative and following simple commands, reoriented      General Comments      Exercises     Assessment/Plan    PT Assessment Patient needs continued PT services  PT Problem List Decreased strength;Decreased mobility;Decreased balance;Decreased knowledge of use of DME;Decreased safety awareness;Decreased  activity tolerance;Decreased cognition       PT Treatment Interventions DME instruction;Gait training;Balance training;Therapeutic exercise;Functional mobility training;Therapeutic activities;Patient/family education    PT Goals (Current goals can be found in the Care Plan section)  Acute Rehab PT Goals PT Goal Formulation: With patient Time For Goal Achievement: 07/27/20 Potential to Achieve Goals: Good    Frequency Min 3X/week   Barriers to discharge        Co-evaluation               AM-PAC PT "6 Clicks" Mobility  Outcome Measure Help needed turning from your back to your side while in a flat bed without using bedrails?: A Little Help needed moving from lying on your back to sitting on the side of a flat bed without using bedrails?: A Little Help needed moving to and from a bed to a chair (including a wheelchair)?: A Little Help needed standing up from a chair using your arms (e.g., wheelchair or bedside chair)?: A Little Help needed to walk in hospital room?: A Lot Help needed climbing 3-5 steps with a railing? : A Lot 6 Click Score: 16    End of Session Equipment Utilized During Treatment: Gait belt;Back brace Activity Tolerance: Patient tolerated treatment well Patient left: in chair;with call bell/phone within reach;with chair alarm set Nurse Communication: Mobility status PT Visit Diagnosis: Difficulty in walking, not elsewhere classified (R26.2)    Time: 1211-1227 PT Time Calculation (min) (ACUTE ONLY): 16 min   Charges:   PT Evaluation $PT Eval Low Complexity: 1 Low        Kati PT, DPT Acute Rehabilitation Services Pager: [phone] Office: [phone]  [name] 07/13/2020, 1:08 PM

## 2020-07-13 NOTE — TOC Initial Note (Addendum)
Transition of Care [facility]) - Initial/Assessment Note    Patient Details  Name: Shawna Lin MRN: [id number] Date of Birth: 07/04/1929  [facility] CM/SW Contact:    Lia Hopping, [place] Phone Number: 07/13/2020, 3:39 PM  Clinical Narrative:                Patient admitted from [facility] a fall. Patient found to have L2 compression fracture  Intervention recommendation is for conservative treatment. CSW spoke with the patient niece Carlyle Basques. She says she is the  patient legal guardian (she will send over documentation). She prefers the patient return to [facility] however understands the patient may need rehab prior to returning.  CSW spoke with the facility Health and [place]. She reports the patient is usually independent with ADL's with some assistance and supervision. She walks with a walker. The facility request the patient go to rehab before returning.  CSW notified pt. Niece [name]. She is agreeable to SNF. She reports the patient will have a new insurance coverage [employer] starting January 1. CSW will notify SNF's.  CSW will complete the FL2 and follow up with bed offers.   Expected Discharge Plan: Skilled Nursing Facility Barriers to Discharge: Continued Medical Work up   Patient Goals and CMS Choice        Expected Discharge Plan and Services Expected Discharge Plan: [place] In-house Referral: Clinical Social Work Discharge Planning Services: CM Consult Post Acute Care Choice: [place] arrangements for the past 2 months: [place] (Memory Care)                                      Prior Living Arrangements/Services Living arrangements for the past 2 months: [place] (Memory Care) Lives with:: Facility Resident Patient language and need for interpreter reviewed:: No        Need for Family Participation in Patient Care: Yes  (Comment) Care giver support system in place?: Yes (comment) Current home services: DME Criminal Activity/Legal Involvement Pertinent to Current Situation/Hospitalization: No - Comment as needed  Activities of Daily Living Home Assistive Devices/Equipment: Eyeglasses ADL Screening (condition at time of admission) Patient's cognitive ability adequate to safely complete daily activities?: No Is the patient deaf or have difficulty hearing?: No Does the patient have difficulty seeing, even when wearing glasses/contacts?: No Does the patient have difficulty concentrating, remembering, or making decisions?: Yes Patient able to express need for assistance with ADLs?: Yes Does the patient have difficulty dressing or bathing?: No Independently performs ADLs?: No Communication: Independent Dressing (OT): Independent Grooming: Independent Feeding: Independent Bathing: Needs assistance Is this a change from baseline?: Pre-admission baseline Toileting: Needs assistance Is this a change from baseline?: Pre-admission baseline In/Out Bed: Needs assistance Is this a change from baseline?: Pre-admission baseline Walks in Home: Needs assistance Is this a change from baseline?: Pre-admission baseline Does the patient have difficulty walking or climbing stairs?: Yes Weakness of Legs: Both Weakness of Arms/Hands: Both  Permission Sought/Granted                  Emotional Assessment Appearance:: Appears stated age Attitude/Demeanor/Rapport: Unable to Assess Affect (typically observed): Unable to Assess     Psych Involvement: No (comment)  Admission diagnosis:  Hip pain [M25.559] L2 vertebral fracture ([place]) [S32.029A] Injury of head, initial encounter [S09.90XA] Fall, initial encounter [W19.XXXA] Anemia, unspecified type [  D64.9] Patient Active Problem List   Diagnosis Date Noted  . L2 vertebral fracture ([place]) 07/12/2020  . Anemia 07/12/2020  . Fall 07/12/2020  . CKD (chronic kidney  disease), stage III ([place]) 07/12/2020  . AKI (acute kidney injury) ([place]) 07/12/2020  . Syncope 08/26/2019  . Recurrent syncope 08/25/2019  . QT prolongation 08/25/2019  . Seizure disorder ([place]) 08/25/2019  . Pain due to onychomycosis of toenails of both feet 01/07/2019  . Dementia ([place]) 12/21/2015  . No blood products 12/21/2015  . 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 Memory loss 09/02/2015  . HTN (hypertension) 09/02/2015   PCP:  System, Provider Not In Pharmacy:   [place], [place] - [address] [place] [address] Phone: [phone] Fax: [phone]     Social Determinants of Health (SDOH) Interventions    Readmission Risk Interventions No flowsheet data found.

## 2020-07-13 NOTE — Progress Notes (Signed)
Copy of blood refusal sent to lab for their records. Rn will continue to monitor.

## 2020-07-13 NOTE — Progress Notes (Addendum)
PROGRESS NOTE    Shawna Lin  XHB:[id number] DOB: 27-Oct-1928 DOA: 07/12/2020 PCP: System, Provider Not In    Chief Complaint  Patient presents with  . Fall    Brief Narrative:  HPI per Dr. Truitt Leep is a 86 y.o. female with medical history significant of dementia, hypertension, seizure disorder, prolonged QTC, vitamin D deficiency who presented from the facility after witnessed mechanical fall.  As per ED providers documentation who spoke to the facility staff, patient was seen ambulating without her walker and before staff could get to the patient, she will was seen falling and hitting her head on the door.  No loss of consciousness was reported.  Patient is on baby aspirin.  Apparently patient was acting different since the fall and was less talkative and interactive than her baseline.  Later on her blood pressure was checked and was elevated at 170s over 100s.  Patient was complaining of low back pain and knee pain prior to EMS arrival.  No other history can be obtained because of patient being demented.  ED Course: She was hemodynamically stable.  She was found to have hemoglobin of 5.9, prior hemoglobin in February 2021 was 9.5.  She had a creatinine of 1.46.  She was found to have acute L2 vertebral body compression fracture with approximately 50% height loss and 2 mm of focal central repulsion without spinal stenosis.  ED provider spoke to on-call neurosurgery who stated that she would see the patient and would order lumbar brace.  CT of the head and cervical spine was negative for acute intracranial or cervical spinal injury.  Right knee x-ray was negative for any acute fracture.  Chest x-ray was negative for acute abnormality.  Fecal occult blood test was negative.  UA was negative as well.  ED provider ordered blood transfusion but apparently patient is a Jehovah's Witness and the niece/[name] refused blood transfusion for the patient as per ED providers  documentation. Hospitalist service was called to evaluate the patient.  Assessment & Plan:   Principal Problem:   L2 vertebral fracture (HCC) Active Problems:   HTN (hypertension)   Dementia (HCC)   Seizure disorder ([place])   Anemia   Fall   CKD (chronic kidney disease), stage III ([place])   AKI (acute kidney injury) ([place])   1 acute L2 compression fracture secondary to mechanical fall CT of the L-spine done on admission showed L2 vertebral body compression fracture with approximately 50% height loss, 2 mm of focal central repulsion without spinal stenosis.  CT head CT C-spine with no acute abnormalities.  Urinalysis negative.  COVID-19 PCR negative.  Place on scheduled Tylenol twice daily.  Neurosurgery consulted who recommended lumbar brace, pain management, conservative treatment, PT/OT, outpatient follow-up.  2.  Anemia/low vitamin B12 levels Patient noted to have a hemoglobin of 5.9.  Last hemoglobin February 2021 was 9.5.  FOBT negative.  Patient noted to be a Jehovah's Witness and refusing blood products at this time and per admitting physician per patient's niece/[name].  TSH within normal limits.  Vitamin B12 of 196.  Anemia panel with iron of 34, TIBC of 4 7, folate of 15.3.  Check a ferritin level.  Hemoglobin currently at 5.7.  Patient seems somewhat asymptomatic.  Vitamin B12 supplementation.  May need IV iron.  Follow H&H.  May need outpatient follow-up with hematology.  Outpatient follow-up.  3.  Acute kidney injury on chronic kidney disease stage IIIa Creatinine on presentation was 1.46.  2021 was 1.04.  Gentle hydration.  Follow.  4.  Dementia Stable.  Continue home regimen sertraline, olanzapine.  Palliative care consulted for goals of care.  5.  Hypertension Lasix on hold.  Increase amlodipine to 5 mg daily.  Follow.  6.  History of seizures Stable.  Continue home regimen Keppra.   DVT prophylaxis: SCDs Code Status: Full Family Communication: Updated patient.  No  family at bedside. Disposition:   Status is: Inpatient    Dispo: The patient is from: Memory care unit/[facility]              Anticipated d/c is to: SNF versus back to memory care unit              Anticipated d/c date is: 1 to 2 days.              Patient currently with a L2 compression fracture, being evaluated by PT, pain being managed, not stable for discharge.       Consultants:   Neurosurgery: Ferne Reus, [place] 07/12/2020  Procedures:   CT L-spine 07/12/2020  CT head CT C-spine 07/12/2020  Plain films of the pelvis and right hip 07/12/2020  Antimicrobials:   None   Subjective: Patient sitting up in chair about to get up with physical therapy to use the bedside commode.  Denies any chest pain no ongoing back pain at this time.  No shortness of breath.  No abdominal pain.  Objective: Vitals:   07/12/20 1916 07/12/20 2027 07/13/20 0100 07/13/20 1000  BP: (!) 167/79 (!) 170/75  (!) 183/70  Pulse: 81 76  69  Resp: 16 16  18   Temp: 98 F (36.7 C) (!) 97.5 F (36.4 C)  98.5 F (36.9 C)  TempSrc: Oral Oral  Oral  SpO2: 100% 100%  100%  Weight:   75.2 kg   Height:   4\' 11"  (1.499 m)     Intake/Output Summary (Last 24 hours) at 07/13/2020 1320 Last data filed at 07/13/2020 1135 Gross per 24 hour  Intake 1337.65 ml  Output 600 ml  Net 737.65 ml   Filed Weights   07/13/20 0100  Weight: 75.2 kg    Examination:  General exam: Appears calm and comfortable.  LSO brace on Respiratory system: Clear to auscultation. Respiratory effort normal. Cardiovascular system: S1 & S2 heard, RRR. No JVD, murmurs, rubs, gallops or clicks. No pedal edema. Gastrointestinal system: Abdomen is nondistended, soft and nontender. No organomegaly or masses felt. Normal bowel sounds heard. Central nervous system: Alert and oriented. No focal neurological deficits. Extremities: Symmetric 5 x 5 power. Skin: No rashes, lesions or ulcers Psychiatry: Judgement and insight appear  normal. Mood & affect appropriate.     Data Reviewed: I have personally reviewed following labs and imaging studies  CBC: Recent Labs  Lab 07/12/20 1415 07/12/20 1723 07/13/20 0307  WBC 6.7 7.2 8.8  NEUTROABS 4.2 4.6  --   HGB 5.9* 5.7* 5.7*  HCT 20.4* 20.0* 20.1*  MCV 82.3 81.6 83.8  PLT 180 205 407    Basic Metabolic Panel: Recent Labs  Lab 07/12/20 1415 07/13/20 0307  NA 143 142  K 4.3 4.8  CL 106 108  CO2 26 24  GLUCOSE 107* 91  BUN 21 20  CREATININE 1.46* 1.48*  CALCIUM 9.7 9.5    GFR: Estimated Creatinine Clearance: 22.8 mL/min (A) (by C-G formula based on SCr of 1.48 mg/dL (H)).  Liver Function Tests: Recent Labs  Lab 07/13/20 0307  AST 26  ALT 14  ALKPHOS 129*  BILITOT 0.3  PROT 6.2*  ALBUMIN 3.9    CBG: No results for input(s): GLUCAP in the last 168 hours.   Recent Results (from the past 240 hour(s))  Resp Panel by RT-PCR (Flu A&B, Covid) Nasopharyngeal Swab     Status: None   Collection Time: 07/12/20  5:23 PM   Specimen: Nasopharyngeal Swab; Nasopharyngeal(NP) swabs in vial transport medium  Result Value Ref Range Status   SARS Coronavirus 2 by RT PCR NEGATIVE NEGATIVE Final    Comment: (NOTE) SARS-CoV-2 target nucleic acids are NOT DETECTED.  The SARS-CoV-2 RNA is generally detectable in upper respiratory specimens during the acute phase of infection. The lowest concentration of SARS-CoV-2 viral copies this assay can detect is 138 copies/mL. A negative result does not preclude SARS-Cov-2 infection and should not be used as the sole basis for treatment or other patient management decisions. A negative result may occur with  improper specimen collection/handling, submission of specimen other than nasopharyngeal swab, presence of viral mutation(s) within the areas targeted by this assay, and inadequate number of viral copies(<138 copies/mL). A negative result must be combined with clinical observations, patient history, and  epidemiological information. The expected result is Negative.  Fact Sheet for Patients:  EntrepreneurPulse.com.au  Fact Sheet for Healthcare Providers:  IncredibleEmployment.be  This test is no t yet approved or cleared by the [place] FDA and  has been authorized for detection and/or diagnosis of SARS-CoV-2 by FDA under an Emergency Use Authorization (EUA). This EUA will remain  in effect (meaning this test can be used) for the duration of the COVID-19 declaration under Section 564(b)(1) of the Act, 21 U.S.C.section 360bbb-3(b)(1), unless the authorization is terminated  or revoked sooner.       Influenza A by PCR NEGATIVE NEGATIVE Final   Influenza B by PCR NEGATIVE NEGATIVE Final    Comment: (NOTE) The Xpert Xpress SARS-CoV-2/FLU/RSV plus assay is intended as an aid in the diagnosis of influenza from Nasopharyngeal swab specimens and should not be used as a sole basis for treatment. Nasal washings and aspirates are unacceptable for Xpert Xpress SARS-CoV-2/FLU/RSV testing.  Fact Sheet for Patients: EntrepreneurPulse.com.au  Fact Sheet for Healthcare Providers: IncredibleEmployment.be  This test is not yet approved or cleared by the [place] FDA and has been authorized for detection and/or diagnosis of SARS-CoV-2 by FDA under an Emergency Use Authorization (EUA). This EUA will remain in effect (meaning this test can be used) for the duration of the COVID-19 declaration under Section 564(b)(1) of the Act, 21 U.S.C. section 360bbb-3(b)(1), unless the authorization is terminated or revoked.  Performed at [facility], [place] [address], [place], [place] [address]          Radiology Studies: DG Chest 2 View  Result Date: 07/12/2020 CLINICAL DATA:  Recent fall.  Hypertension EXAM: CHEST - 2 VIEW COMPARISON:  Nov 22, 2019 FINDINGS: The lungs are clear. Heart is upper normal  in size with pulmonary vascularity normal. No adenopathy. No pneumothorax. There is degenerative change in the lower thoracic spine and in each shoulder. IMPRESSION: Lungs clear.  Heart upper normal in size. Electronically Signed   By: Lowella Grip III M.D.   On: 07/12/2020 14:05   DG Knee 1-2 Views Right  Result Date: 07/12/2020 CLINICAL DATA:  Right knee pain after fall at nursing home. EXAM: RIGHT KNEE - 1-2 VIEW COMPARISON:  None. FINDINGS: No acute fracture or dislocation. There is patellar [place]. Moderate osteoarthritis with tricompartmental peripheral spurring, medial  tibiofemoral joint space narrowing, and small knee joint effusion. Bones are diffusely under mineralized. Small quadriceps tendon enthesophyte. There are vascular calcifications. IMPRESSION: 1. No acute fracture or dislocation of the right knee. 2. Moderate osteoarthritis and small joint effusion. Patella [place]. Electronically Signed   By: Keith Rake M.D.   On: 07/12/2020 19:50   DG Knee 2 Views Right  Addendum Date: 07/12/2020   ADDENDUM REPORT: 07/12/2020 14:11 CORRECT EXAM: PELVIS AND RIGHT HIP: 2+ VIEW Electronically Signed   By: Lowella Grip III M.D.   On: 07/12/2020 14:11   Result Date: 07/12/2020 CLINICAL DATA:  Pain following fall EXAM: RIGHT KNEE - 1-2 VIEW COMPARISON:  June 26, 2018 FINDINGS: Frontal pelvis as well as frontal and lateral right hip images were obtained. Bones osteoporotic. No acute fracture or dislocation. There is moderate symmetric narrowing of each hip joint. No erosive change. There is degenerative change in the lower lumbar spine. There are multiple foci of arterial vascular calcification in the pelvis and thigh regions. IMPRESSION: Symmetric narrowing of each hip joint. No acute fracture or dislocation. Bones osteoporotic. Multiple foci of arterial vascular calcification noted. Electronically Signed: By: Lowella Grip III M.D. On: 07/12/2020 14:07   CT Head Wo  Contrast  Result Date: 07/12/2020 CLINICAL DATA:  [place] today with trauma to the head and neck. EXAM: CT HEAD WITHOUT CONTRAST CT CERVICAL SPINE WITHOUT CONTRAST TECHNIQUE: Multidetector CT imaging of the head and cervical spine was performed following the standard protocol without intravenous contrast. Multiplanar CT image reconstructions of the cervical spine were also generated. COMPARISON:  08/27/2019 MRI. FINDINGS: CT HEAD FINDINGS Brain: Generalized atrophy, with temporal lobe predominance. Mild chronic small-vessel ischemic changes of the white matter. No sign of acute infarction, mass lesion, hemorrhage, hydrocephalus or extra-axial collection. Vascular: There is atherosclerotic calcification of the major vessels at the base of the brain. Skull: Chronic mild old appearance of the calvarium which could be due to demineralization, anemia or infiltrating marrow process. Lack of progressive change since January of this year argues against an aggressive process. History describes anemia and vitamin-D deficiency, which could relate to this finding. Sinuses/Orbits: Clear/normal Other: None CT CERVICAL SPINE FINDINGS Alignment: Curvature convex to the right. 2 mm degenerative anterolisthesis at C5-6. Skull base and vertebrae: No fracture. Mild appearance of the bone, similar to the calvarium, though not as pronounced. Soft tissues and spinal canal: No soft tissue neck lesion seen. Disc levels: Chronic degenerative spondylosis from C3-4 through C6-7. No apparent significant canal stenosis. Facet osteoarthritis most pronounced on the right at C5-6 and on the left at C2-3, C3-4 and C4-5. No severe bony foraminal stenosis. Upper chest: Negative Other: None IMPRESSION: HEAD CT: No acute or traumatic finding. Atrophy and chronic small-vessel ischemic changes. Atrophy is temporal lobe predominant. CERVICAL SPINE CT: No acute or traumatic finding. Chronic degenerative spondylosis and facet osteoarthritis. Electronically  Signed   By: Nelson Chimes M.D.   On: 07/12/2020 13:48   CT Cervical Spine Wo Contrast  Result Date: 07/12/2020 CLINICAL DATA:  [place] today with trauma to the head and neck. EXAM: CT HEAD WITHOUT CONTRAST CT CERVICAL SPINE WITHOUT CONTRAST TECHNIQUE: Multidetector CT imaging of the head and cervical spine was performed following the standard protocol without intravenous contrast. Multiplanar CT image reconstructions of the cervical spine were also generated. COMPARISON:  08/27/2019 MRI. FINDINGS: CT HEAD FINDINGS Brain: Generalized atrophy, with temporal lobe predominance. Mild chronic small-vessel ischemic changes of the white matter. No sign of acute infarction, mass lesion, hemorrhage,  hydrocephalus or extra-axial collection. Vascular: There is atherosclerotic calcification of the major vessels at the base of the brain. Skull: Chronic mild old appearance of the calvarium which could be due to demineralization, anemia or infiltrating marrow process. Lack of progressive change since January of this year argues against an aggressive process. History describes anemia and vitamin-D deficiency, which could relate to this finding. Sinuses/Orbits: Clear/normal Other: None CT CERVICAL SPINE FINDINGS Alignment: Curvature convex to the right. 2 mm degenerative anterolisthesis at C5-6. Skull base and vertebrae: No fracture. Mild appearance of the bone, similar to the calvarium, though not as pronounced. Soft tissues and spinal canal: No soft tissue neck lesion seen. Disc levels: Chronic degenerative spondylosis from C3-4 through C6-7. No apparent significant canal stenosis. Facet osteoarthritis most pronounced on the right at C5-6 and on the left at C2-3, C3-4 and C4-5. No severe bony foraminal stenosis. Upper chest: Negative Other: None IMPRESSION: HEAD CT: No acute or traumatic finding. Atrophy and chronic small-vessel ischemic changes. Atrophy is temporal lobe predominant. CERVICAL SPINE CT: No acute or traumatic  finding. Chronic degenerative spondylosis and facet osteoarthritis. Electronically Signed   By: Nelson Chimes M.D.   On: 07/12/2020 13:48   CT Lumbar Spine Wo Contrast  Result Date: 07/12/2020 CLINICAL DATA:  Status post fall, low back pain EXAM: CT LUMBAR SPINE WITHOUT CONTRAST TECHNIQUE: Multidetector CT imaging of the lumbar spine was performed without intravenous contrast administration. Multiplanar CT image reconstructions were also generated. COMPARISON:  None. FINDINGS: Segmentation: 5 lumbar type vertebrae. Alignment: Grade 1 anterolisthesis of L5 on S1 secondary to bilateral L5 pars interarticularis defects. Osseous fusion across the L5-S1 disc space. Vertebrae: Severe osteopenia. L2 vertebral body compression fracture with approximately 50% height loss and 2 mm of focal central retropulsion without spinal stenosis. No aggressive osseous lesion. Paraspinal and other soft tissues: No acute paraspinal abnormality. Abdominal aortic atherosclerosis. Disc levels: Degenerative disease with disc height loss at L2-3, L3-4, and L4-5. Osseous fusion of the L5-S1 disc spaces. T12-L1: Broad-based disc osteophyte complex. No foraminal or central canal stenosis. L1-2: Minimal broad-based disc bulge. No foraminal or central canal stenosis. Mild bilateral facet arthropathy. L2-3: Mild broad-based disc bulge. Mild bilateral facet arthropathy. No foraminal or central canal stenosis. L3-4: Broad-based disc bulge. Mild bilateral facet arthropathy. Mild left foraminal stenosis. No right foraminal stenosis. No central canal stenosis. L4-5: Broad-based disc osteophyte complex. Moderate bilateral facet arthropathy. No definite foraminal or central canal stenosis. IMPRESSION: 1. Acute L2 vertebral body compression fracture with approximately 50% height loss and 2 mm of focal central retropulsion without spinal stenosis. 2. Diffuse lumbar spine spondylosis as described above. 3. Aortic Atherosclerosis (ICD10-I70.0).  Electronically Signed   By: Kathreen Devoid   On: 07/12/2020 13:54   DG Hip Unilat W or Wo Pelvis 2-3 Views Right  Addendum Date: 07/13/2020   ADDENDUM REPORT: 07/12/2020 14:11 CORRECT EXAM: PELVIS AND RIGHT HIP: 2+ VIEW Electronically Signed   By: Lowella Grip III M.D.   On: 07/12/2020 14:11   Result Date: 07/12/2020 ADDENDUM REPORT: 07/12/2020 14:11 CORRECT EXAM: PELVIS AND RIGHT HIP: 2+ VIEW Electronically Signed: By: Lowella Grip III M.D. On: 07/12/2020 14:11        Scheduled Meds: . acetaminophen  500 mg Oral BID  . amLODipine  2.5 mg Oral Daily  . cyanocobalamin  1,000 mcg Subcutaneous Daily  . feeding supplement  237 mL Oral BID BM  . levETIRAcetam  500 mg Oral BID  . OLANZapine  2.5 mg Oral QHS  .  sertraline  100 mg Oral Daily   Continuous Infusions: . sodium chloride    . sodium chloride 75 mL/hr at 07/13/20 1130     LOS: 0 days    Time spent: 35 minutes    Irine Seal, MD [facility]   To contact the attending provider between 7A-7P or the covering provider during after hours 7P-7A, please log into the web site [email or web address] and access using universal [place] password for that web site. If you do not have the password, please call the hospital operator.  07/13/2020, 1:20 PM

## 2020-07-13 NOTE — NC FL2 (Signed)
[place] LEVEL OF CARE SCREENING TOOL     IDENTIFICATION  Patient Name: Shawna Lin Birthdate: 1929-01-02 Sex: female Admission Date (Current Location): 07/12/2020  [facility] and [place] Number:  Herbalist and Address:  [facility],  [place], [place]      Provider Number: [id number]  Attending Physician Name and Address:  Eugenie Filler, MD  Relative Name and Phone Number:  Carlyle Basques Niece   [phone]    Current Level of Care: Hospital Recommended Level of Care: [place] Prior Approval Number:    Date Approved/Denied:   PASRR Number:    Discharge Plan: SNF    Current Diagnoses: Patient Active Problem List   Diagnosis Date Noted   L2 vertebral fracture ([place]) 07/12/2020   Anemia 07/12/2020   Fall 07/12/2020   CKD (chronic kidney disease), stage III ([place]) 07/12/2020   AKI (acute kidney injury) ([place]) 07/12/2020   Syncope 08/26/2019   Recurrent syncope 08/25/2019   QT prolongation 08/25/2019   Seizure disorder ([place]) 08/25/2019   Pain due to onychomycosis of toenails of both feet 01/07/2019   Dementia ([place]) 12/21/2015   No blood products 12/21/2015   Atypical syncope 12/21/2015   Memory loss 09/02/2015   HTN (hypertension) 09/02/2015    Orientation RESPIRATION BLADDER Height & Weight     Self  Normal Continent Weight: 165 lb 12.6 oz (75.2 kg) Height:  4\' 11"  (149.9 cm)  BEHAVIORAL SYMPTOMS/MOOD NEUROLOGICAL BOWEL NUTRITION STATUS      Continent Diet (Heart Healthy)  AMBULATORY STATUS COMMUNICATION OF NEEDS Skin   Extensive Assist Verbally Normal (L2 compression fracture)                       Personal Care Assistance Level of Assistance  Bathing,Feeding,Dressing Bathing Assistance: Maximum assistance Feeding assistance: Independent Dressing Assistance: Maximum assistance     Functional Limitations Info  Sight,Speech,Hearing Sight Info: Adequate Hearing  Info: Adequate Speech Info: Adequate    SPECIAL CARE FACTORS FREQUENCY  PT (By licensed PT),OT (By licensed OT)     PT Frequency: 5x/week OT Frequency: 5x/week            Contractures Contractures Info: Not present    Additional Factors Info  Code Status,Allergies,Psychotropic Code Status Info: Fullcode Allergies Info: Allergies: No Known Allergies Psychotropic Info: Zyprexa and Zoloft         Current Medications (07/13/2020):  This is the current hospital active medication list Current Facility-Administered Medications  Medication Dose Route Frequency Provider Last Rate Last Admin   0.9 %  sodium chloride infusion  10 mL/hr Intravenous Once Aberman, Caroline C, PA-C       0.9 %  sodium chloride infusion   Intravenous Continuous Aline August, MD 75 mL/hr at 07/13/20 1130 New Bag at 07/13/20 1130   acetaminophen (TYLENOL) tablet 500 mg  500 mg Oral BID Eugenie Filler, MD   500 mg at 07/13/20 1133   amLODipine (NORVASC) tablet 2.5 mg  2.5 mg Oral Daily Aline August, MD   2.5 mg at 07/13/20 1133   cyanocobalamin ((VITAMIN B-12)) injection 1,000 mcg  1,000 mcg Subcutaneous Daily Eugenie Filler, MD       feeding supplement (ENSURE ENLIVE / ENSURE PLUS) liquid 237 mL  237 mL Oral BID BM Alekh, Kshitiz, MD   237 mL at 07/13/20 1458   levETIRAcetam (KEPPRA) tablet 500 mg  500 mg Oral BID Aline August, MD   500 mg  at 07/13/20 1134   morphine 2 MG/ML injection 1 mg  1 mg Intravenous Q2H PRN Aline August, MD       OLANZapine (ZYPREXA) tablet 2.5 mg  2.5 mg Oral QHS Alekh, Kshitiz, MD   2.5 mg at 07/12/20 2230   oxyCODONE (Oxy IR/ROXICODONE) immediate release tablet 5 mg  5 mg Oral Q4H PRN Aline August, MD       senna-docusate (Senokot-S) tablet 1 tablet  1 tablet Oral QHS PRN Aline August, MD       sertraline (ZOLOFT) tablet 100 mg  100 mg Oral Daily Aline August, MD   100 mg at 07/13/20 1134     Discharge Medications: Please see discharge summary  for a list of discharge medications.  Relevant Imaging Results:  Relevant Lab Results:   Additional Information ssn:[id number]  Lia Hopping, LCSW

## 2020-07-13 NOTE — Progress Notes (Signed)
Pt refused vs at 0600 check. Once again wanted to be left alone and shoved nursing staff away. Rn will continue to monitor.

## 2020-07-13 NOTE — Progress Notes (Signed)
Patient standing up in room out of bed, bed alarm did not go off, pulled out IV. Patient urinated in the floor. Did not fall. Is standing up bedside of bed. This nurse and [place] RN cleaned patient up, changed linens and helped her to the bedside commode and back to bed, got patient new socks. Patient did have a bowel movement in BSC. Says that she feels better and is denying pain, back brace is still on. Patient repositioned in bed and side rails up x3 with bed alarm on. Call bell and belongings within reach. New IV in and fluid order restarted.

## 2020-07-13 NOTE — Progress Notes (Signed)
Provider Sharlet Salina paged concerning pt hgb of 5.7. this is the same as it was on admit to unit. Previous md note noted refusal of blood products on his notes. Pt remains stable. Rn will continue to monitor.

## 2020-07-13 NOTE — Consult Note (Signed)
Palliative care consult note  Reason for consult: Goals of care in light of dementia, anemia, and new L2 fracture  Palliative care consult received.  Chart reviewed including personal review of pertinent labs and imaging.  Discussed with bedside care team.  Briefly, Shawna Lin is an 86 year old female with past medical history of dementia, hypertension, seizure disorder, prolonged QT, vitamin D deficiency who presented to the ED following a witnessed mechanical fall at her assisted living facility.  Per chart, she was seen ambulating without her walker but fell before staff could get to her and hit her head on the door.  She did not lose consciousness.  She has been acting different since the fall and was brought to the ED for evaluation.  She was also complaining of low back and knee pain.  Work-up in the ED revealed hemoglobin of 5.9, creatinine of 1.46, and L2 vertebral body compression fracture with 50% height loss.  She was evaluated by neurosurgery with recommendation for lumbar brace.  Anemia on presentation without clear etiology and recommendation was for transfusion of packed red blood cells.  In discussion with her surrogate Shawna Lin, her niece) it was noted that she is [name] and therefore receiving blood products is against her religious beliefs.  Palliative consulted for goals of care.  I saw and examined Shawna Lin.  She was lying in bed in no distress.  She was awake and alert and oriented to self but confused about situation surrounding her admission to the hospital.  She denied any needs or pain at this time.  Requested to be up in chair to be able to eat something later.  I called and was able to reach patient's niece, Shawna Lin.   I introduced palliative care as specialized medical care for people living with serious illness. It focuses on providing relief from the symptoms and stress of a serious illness. The goal is to improve quality of life for both the patient and the  family.  We discussed clinical course as well as wishes moving forward in regard to advanced directives.  We reviewed multiple comorbidities including anemia, dementia, and new L2 compression fracture and discussed how some of these may improve but others are chronic progressive diseases which will continue to change regardless of interventions moving forward.  Values and goals of care important to patient and family were attempted to be elicited.  Faith is very important and she wants to ensure that Shawna Lin's religious conviction not to receive blood products is honored.  At this point, Shawna Lin endorses any and all aggressive interventions with the exception of transfusion of blood products.  We discussed recommendation to consider completion of a MOST form and how can help to outline care plan that is most in line with Shawna Lin's desires and will be a good way to ensure that this is carried through as she moves through multiple care venues.  Discussed working toward placement at skilled facility prior to returning to [facility].  Questions and concerns addressed.   PMT will continue to support holistically.  -Patient's niece/surrogate Shawna Lin has my contact information and will call if she would like to complete MOST form prior to discharge.  Start time: 1720 End time: 1800 Total time: 40 minutes  Greater than 50%  of this time was spent counseling and coordinating care related to the above assessment and plan.  Micheline Rough, MD [place] [facility] [phone]

## 2020-07-13 NOTE — Progress Notes (Signed)
Pt refusing vital signs at this time. Pt stating she wants to sleep and wants to be left alone. Nursing staff will continue to monitor pt.

## 2020-07-13 NOTE — Plan of Care (Signed)

## 2020-07-14 ENCOUNTER — Encounter (HOSPITAL_COMMUNITY): Payer: Self-pay | Admitting: Internal Medicine

## 2020-07-14 DIAGNOSIS — S32021D Stable burst fracture of second lumbar vertebra, subsequent encounter for fracture with routine healing: Secondary | ICD-10-CM | POA: Diagnosis not present

## 2020-07-14 DIAGNOSIS — S32020D Wedge compression fracture of second lumbar vertebra, subsequent encounter for fracture with routine healing: Secondary | ICD-10-CM

## 2020-07-14 DIAGNOSIS — N1831 Chronic kidney disease, stage 3a: Secondary | ICD-10-CM | POA: Diagnosis not present

## 2020-07-14 DIAGNOSIS — N179 Acute kidney failure, unspecified: Secondary | ICD-10-CM | POA: Diagnosis not present

## 2020-07-14 DIAGNOSIS — D649 Anemia, unspecified: Secondary | ICD-10-CM | POA: Diagnosis not present

## 2020-07-14 LAB — CBC
HCT: 18.7 % — ABNORMAL LOW (ref 36.0–46.0)
Hemoglobin: 5.3 g/dL — CL (ref 12.0–15.0)
MCH: 23.9 pg — ABNORMAL LOW (ref 26.0–34.0)
MCHC: 28.3 g/dL — ABNORMAL LOW (ref 30.0–36.0)
MCV: 84.2 fL (ref 80.0–100.0)
Platelets: 146 10*3/uL — ABNORMAL LOW (ref 150–400)
RBC: 2.22 MIL/uL — ABNORMAL LOW (ref 3.87–5.11)
RDW: 22.7 % — ABNORMAL HIGH (ref 11.5–15.5)
WBC: 6.1 10*3/uL (ref 4.0–10.5)
nRBC: 2.3 % — ABNORMAL HIGH (ref 0.0–0.2)

## 2020-07-14 LAB — BASIC METABOLIC PANEL
Anion gap: 10 (ref 5–15)
BUN: 22 mg/dL (ref 8–23)
CO2: 22 mmol/L (ref 22–32)
Calcium: 9.3 mg/dL (ref 8.9–10.3)
Chloride: 109 mmol/L (ref 98–111)
Creatinine, Ser: 1.47 mg/dL — ABNORMAL HIGH (ref 0.44–1.00)
GFR, Estimated: 34 mL/min — ABNORMAL LOW (ref >60)
Glucose, Bld: 82 mg/dL (ref 70–99)
Potassium: 3.8 mmol/L (ref 3.5–5.1)
Sodium: 141 mmol/L (ref 135–145)

## 2020-07-14 LAB — FERRITIN: Ferritin: 32 ng/mL (ref 11–307)

## 2020-07-14 LAB — SAVE SMEAR(SSMR), FOR PROVIDER SLIDE REVIEW

## 2020-07-14 LAB — RETICULOCYTES
Immature Retic Fract: 33.2 % — ABNORMAL HIGH (ref 2.3–15.9)
RBC.: 2.22 MIL/uL — ABNORMAL LOW (ref 3.87–5.11)
Retic Count, Absolute: 59.5 10*3/uL (ref 19.0–186.0)
Retic Ct Pct: 2.7 % (ref 0.4–3.1)

## 2020-07-14 LAB — MAGNESIUM: Magnesium: 1.9 mg/dL (ref 1.7–2.4)

## 2020-07-14 MED ORDER — EPOETIN ALFA 20000 UNIT/ML IJ SOLN
40000.0000 [IU] | INTRAMUSCULAR | Status: DC
Start: 2020-07-14 — End: 2020-07-22
  Administered 2020-07-14 – 2020-07-20 (×4): 40000 [IU] via SUBCUTANEOUS
  Filled 2020-07-14 (×2): qty 2
  Filled 2020-07-14: qty 1
  Filled 2020-07-14: qty 2
  Filled 2020-07-14: qty 1

## 2020-07-14 MED ORDER — FOLIC ACID 1 MG PO TABS
2.0000 mg | ORAL_TABLET | Freq: Every day | ORAL | Status: DC
  Administered 2020-07-14 – 2020-07-23 (×10): 2 mg via ORAL
  Filled 2020-07-14 (×10): qty 2

## 2020-07-14 MED ORDER — SODIUM CHLORIDE 0.9 % IV SOLN
510.0000 mg | Freq: Once | INTRAVENOUS | Status: AC
  Administered 2020-07-14: 21:00:00 510 mg via INTRAVENOUS
  Filled 2020-07-14: qty 510

## 2020-07-14 MED ORDER — SODIUM CHLORIDE 0.9 % IV SOLN
510.0000 mg | Freq: Once | INTRAVENOUS | Status: DC
  Filled 2020-07-14: qty 17

## 2020-07-14 MED ORDER — PANTOPRAZOLE SODIUM 40 MG PO TBEC
40.0000 mg | DELAYED_RELEASE_TABLET | Freq: Every day | ORAL | Status: DC
  Administered 2020-07-14 – 2020-07-23 (×9): 40 mg via ORAL
  Filled 2020-07-14 (×11): qty 1

## 2020-07-14 NOTE — Progress Notes (Signed)
Nutrition Brief Note  Patient identified on the Malnutrition Screening Tool (MST) Report. MST score of 2.0 indicating that there have been no changes in appetite and patient (or person responding to MST questions) is unsure about any weight loss recently.   Wt Readings from Last 15 Encounters:  07/13/20 75.2 kg  10/15/19 76.2 kg  08/28/19 77.6 kg  08/03/19 74.8 kg  06/26/18 64.9 kg  11/02/17 61.2 kg  12/18/16 67.3 kg  03/09/16 65.3 kg  03/01/16 64.4 kg  12/23/15 66.9 kg  09/01/15 68.1 kg    Body mass index is 33.48 kg/m. Patient meets criteria for obesity based on current BMI. Weight on 08/28/19 was 171 lb, weight on 10/15/19 was 168 lb, and weight yesterday was 165 lb.   Skin is WDL. Patient is noted to be a/o to self only. Palliative Care note from yesterday evening reviewed. Patient is Full Code.   Current diet order is Heart Healthy and she is eating 50-75% of meals. Labs and medications reviewed.   Ensure Enlive was ordered BID at the time of admission per ONS protocol. She has accepted all 3 bottles offered to her. No additional nutrition interventions warranted at this time. If nutrition issues arise, please consult RD.      Jarome Matin, MS, RD, LDN, CNSC Inpatient Clinical Dietitian RD pager # available in [place]  After hours/weekend pager # available in [facility]

## 2020-07-14 NOTE — Consult Note (Addendum)
[place]  Telephone:([phone]) [phone] Fax:([phone]    [place]  Referring MD:  Dr. Irine Seal  Reason for Referral: Anemia  HPI: [name] is an 86 year old female with a past medical history significant for dementia, hypertension, history of seizure disorder, prolonged QTC, vitamin D deficiency.  Currently resides in a memory care unit at [facility].  She was sent to the emergency room by the facility after a witnessed fall.  The patient reportedly hit her head on the door when she fell and had no loss of consciousness.  Staff noticed that the patient was acting different following the fall and less talkative and interactive than her baseline.  Blood pressure was elevated at the facility and she was sent to the emergency room via EMS.  Labs on admission notable for hemoglobin of 5.9, hematocrit 20.4, MCV 82.3, platelet count 180,000, creatinine 1.46, stool for occult blood negative.  Additional lab work obtained this admission include a vitamin B12 level which was low normal at 196, folate level 15.3, iron level normal at 34, TIBC normal at 407, percent saturation low at 8%.  Ferritin was low normal at 32.  Immature reticulocyte fraction was elevated at 33.2%. 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  Most recent CBC available for comparison was performed on 08/27/2019 and at that time her hemoglobin was 9.4, hematocrit 31.5, MCV 86.1.   The patient was seen in her hospital room today.  She is sitting up in the recliner.  No family at the bedside.  The patient tells me that she feels fine today and is not having any pain.  The patient is oriented to self only (the patient believes that she is currently in the [place] and that it is the 1970s) and therefore unable to obtain a comprehensive review of systems.  However, no bleeding has been reported by nursing.  Hematology was  asked to see the patient to make recommendations regarding her anemia.   Past Medical History:  Diagnosis Date  . Hypertension   . Iron deficiency anemia   . Memory loss   . Vitamin D deficiency   :    Past Surgical History:  Procedure Laterality Date  . UTERINE FIBROID SURGERY    :   CURRENT MEDS: Current Facility-Administered Medications  Medication Dose Route Frequency Provider Last Rate Last Admin  . 0.9 %  sodium chloride infusion  10 mL/hr Intravenous Once Suzy Bouchard, PA-C      . acetaminophen (TYLENOL) tablet 500 mg  500 mg Oral BID Eugenie Filler, MD   500 mg at 07/14/20 0043  . amLODipine (NORVASC) tablet 5 mg  5 mg Oral Daily Eugenie Filler, MD      . cyanocobalamin ((VITAMIN B-12)) injection 1,000 mcg  1,000 mcg Subcutaneous Daily Eugenie Filler, MD      . feeding supplement (ENSURE ENLIVE / ENSURE PLUS) liquid 237 mL  237 mL Oral BID BM Starla Link, Kshitiz, MD   237 mL at 07/13/20 1458  . hydrocerin (EUCERIN) cream   Topical BID Eugenie Filler, MD      . levETIRAcetam (KEPPRA) tablet 500 mg  500 mg Oral BID Aline August, MD   500 mg at 07/14/20 0043  . morphine 2 MG/ML injection 1 mg  1 mg Intravenous Q2H PRN Alekh, Kshitiz, MD      . OLANZapine (ZYPREXA) tablet 2.5 mg  2.5 mg  Oral QHS Aline August, MD   2.5 mg at 07/12/20 2230  . oxyCODONE (Oxy IR/ROXICODONE) immediate release tablet 5 mg  5 mg Oral Q4H PRN Starla Link, Kshitiz, MD      . pantoprazole (PROTONIX) EC tablet 40 mg  40 mg Oral Q0600 Eugenie Filler, MD      . senna-docusate (Senokot-S) tablet 1 tablet  1 tablet Oral QHS Eugenie Filler, MD   1 tablet at 07/14/20 0043  . sertraline (ZOLOFT) tablet 100 mg  100 mg Oral Daily Aline August, MD   100 mg at 07/13/20 1134      No Known Allergies:  Family History  Problem Relation Age of Onset  . Heart attack Father   . Hypertension Mother   . Colon cancer Brother   . Hypertension Brother   . Diabetes Sister   . Kidney failure  Sister   . Hypertension Sister   . Dementia Neg Hx   :  Social History   Socioeconomic History  . Marital status: Single    Spouse name: Not on file  . Number of children: 0  . Years of education: 84  . Highest education level: Not on file  Occupational History  . Occupation: Retired  Tobacco Use  . Smoking status: Never Smoker  . Smokeless tobacco: Never Used  Vaping Use  . Vaping Use: Never used  Substance and Sexual Activity  . Alcohol use: No    Alcohol/week: 0.0 standard drinks  . Drug use: No  . Sexual activity: Not on file  Other Topics Concern  . Not on file  Social History Narrative   Lives in memory care at [facility]    Right handed   Caffeine: tea once in awhile    Social Determinants of Health   Financial Resource Strain: Not on file  Food Insecurity: Not on file  Transportation Needs: Not on file  Physical Activity: Not on file  Stress: Not on file  Social Connections: Not on file  Intimate Partner Violence: Not on file  :  REVIEW OF SYSTEMS: Unable to obtain secondary to dementia.  Exam: Patient Vitals for the past 24 hrs:  BP Temp Temp src Pulse Resp SpO2  07/14/20 0552 (!) 141/64 97.7 F (36.5 C) Oral 72 16 99 %  07/13/20 2028 (!) 155/57 98.1 F (36.7 C) Oral 72 16 100 %  07/13/20 1844 (!) 171/84 98.6 F (37 C) Oral 93 17 99 %  07/13/20 1336 (!) 103/54 98.1 F (36.7 C) Oral 71 16 99 %  07/13/20 1000 (!) 183/70 98.5 F (36.9 C) Oral 69 18 100 %    General: Awake and alert, sitting up in recliner, no distress Eyes:  no scleral icterus.   ENT:  There were no oropharyngeal lesions.    Lymphatics:  Negative cervical, supraclavicular or axillary adenopathy.   Respiratory: lungs were clear bilaterally without wheezing or crackles.   Cardiovascular:  Regular rate and rhythm, S1/S2, without murmur, rub or gallop.  There was no pedal edema.   GI:  abdomen was soft, flat, nontender, nondistended, without organomegaly.   Musculoskeletal: LSO  brace in place Skin: No petechiae.  Has scattered ecchymoses on her arms.  Neuro exam was nonfocal.  Patient is alert and oriented to person only.  Thinks that she is in the [place] that it is the 1970s.  LABS:  Lab Results  Component Value Date   WBC 6.1 07/14/2020   HGB 5.3 (LL) 07/14/2020   HCT 18.7 (L) 07/14/2020  PLT 146 (L) 07/14/2020   GLUCOSE 82 07/14/2020   ALT 14 07/13/2020   AST 26 07/13/2020   NA 141 07/14/2020   K 3.8 07/14/2020   CL 109 07/14/2020   CREATININE 1.47 (H) 07/14/2020   BUN 22 07/14/2020   CO2 22 07/14/2020   HGBA1C 5.8 (H) 12/22/2015    DG Chest 2 View  Result Date: 07/12/2020 CLINICAL DATA:  Recent fall.  Hypertension EXAM: CHEST - 2 VIEW COMPARISON:  Nov 22, 2019 FINDINGS: The lungs are clear. Heart is upper normal in size with pulmonary vascularity normal. No adenopathy. No pneumothorax. There is degenerative change in the lower thoracic spine and in each shoulder. IMPRESSION: Lungs clear.  Heart upper normal in size. Electronically Signed   By: Lowella Grip III M.D.   On: 07/12/2020 14:05   DG Knee 1-2 Views Right  Result Date: 07/12/2020 CLINICAL DATA:  Right knee pain after fall at nursing home. EXAM: RIGHT KNEE - 1-2 VIEW COMPARISON:  None. FINDINGS: No acute fracture or dislocation. There is patellar [place]. Moderate osteoarthritis with tricompartmental peripheral spurring, medial tibiofemoral joint space narrowing, and small knee joint effusion. Bones are diffusely under mineralized. Small quadriceps tendon enthesophyte. There are vascular calcifications. IMPRESSION: 1. No acute fracture or dislocation of the right knee. 2. Moderate osteoarthritis and small joint effusion. Patella [place]. Electronically Signed   By: Keith Rake M.D.   On: 07/12/2020 19:50   DG Knee 2 Views Right  Addendum Date: 07/12/2020   ADDENDUM REPORT: 07/12/2020 14:11 CORRECT EXAM: PELVIS AND RIGHT HIP: 2+ VIEW Electronically Signed   By: Lowella Grip III M.D.    On: 07/12/2020 14:11   Result Date: 07/12/2020 CLINICAL DATA:  Pain following fall EXAM: RIGHT KNEE - 1-2 VIEW COMPARISON:  June 26, 2018 FINDINGS: Frontal pelvis as well as frontal and lateral right hip images were obtained. Bones osteoporotic. No acute fracture or dislocation. There is moderate symmetric narrowing of each hip joint. No erosive change. There is degenerative change in the lower lumbar spine. There are multiple foci of arterial vascular calcification in the pelvis and thigh regions. IMPRESSION: Symmetric narrowing of each hip joint. No acute fracture or dislocation. Bones osteoporotic. Multiple foci of arterial vascular calcification noted. Electronically Signed: By: Lowella Grip III M.D. On: 07/12/2020 14:07   CT Head Wo Contrast  Result Date: 07/12/2020 CLINICAL DATA:  [place] today with trauma to the head and neck. EXAM: CT HEAD WITHOUT CONTRAST CT CERVICAL SPINE WITHOUT CONTRAST TECHNIQUE: Multidetector CT imaging of the head and cervical spine was performed following the standard protocol without intravenous contrast. Multiplanar CT image reconstructions of the cervical spine were also generated. COMPARISON:  08/27/2019 MRI. FINDINGS: CT HEAD FINDINGS Brain: Generalized atrophy, with temporal lobe predominance. Mild chronic small-vessel ischemic changes of the white matter. No sign of acute infarction, mass lesion, hemorrhage, hydrocephalus or extra-axial collection. Vascular: There is atherosclerotic calcification of the major vessels at the base of the brain. Skull: Chronic mild old appearance of the calvarium which could be due to demineralization, anemia or infiltrating marrow process. Lack of progressive change since January of this year argues against an aggressive process. History describes anemia and vitamin-D deficiency, which could relate to this finding. Sinuses/Orbits: Clear/normal Other: None CT CERVICAL SPINE FINDINGS Alignment: Curvature convex to the right. 2 mm  degenerative anterolisthesis at C5-6. Skull base and vertebrae: No fracture. Mild appearance of the bone, similar to the calvarium, though not as pronounced. Soft tissues and spinal canal: No soft  tissue neck lesion seen. Disc levels: Chronic degenerative spondylosis from C3-4 through C6-7. No apparent significant canal stenosis. Facet osteoarthritis most pronounced on the right at C5-6 and on the left at C2-3, C3-4 and C4-5. No severe bony foraminal stenosis. Upper chest: Negative Other: None IMPRESSION: HEAD CT: No acute or traumatic finding. Atrophy and chronic small-vessel ischemic changes. Atrophy is temporal lobe predominant. CERVICAL SPINE CT: No acute or traumatic finding. Chronic degenerative spondylosis and facet osteoarthritis. Electronically Signed   By: Nelson Chimes M.D.   On: 07/12/2020 13:48   CT Cervical Spine Wo Contrast  Result Date: 07/12/2020 CLINICAL DATA:  [place] today with trauma to the head and neck. EXAM: CT HEAD WITHOUT CONTRAST CT CERVICAL SPINE WITHOUT CONTRAST TECHNIQUE: Multidetector CT imaging of the head and cervical spine was performed following the standard protocol without intravenous contrast. Multiplanar CT image reconstructions of the cervical spine were also generated. COMPARISON:  08/27/2019 MRI. FINDINGS: CT HEAD FINDINGS Brain: Generalized atrophy, with temporal lobe predominance. Mild chronic small-vessel ischemic changes of the white matter. No sign of acute infarction, mass lesion, hemorrhage, hydrocephalus or extra-axial collection. Vascular: There is atherosclerotic calcification of the major vessels at the base of the brain. Skull: Chronic mild old appearance of the calvarium which could be due to demineralization, anemia or infiltrating marrow process. Lack of progressive change since January of this year argues against an aggressive process. History describes anemia and vitamin-D deficiency, which could relate to this finding. Sinuses/Orbits: Clear/normal Other:  None CT CERVICAL SPINE FINDINGS Alignment: Curvature convex to the right. 2 mm degenerative anterolisthesis at C5-6. Skull base and vertebrae: No fracture. Mild appearance of the bone, similar to the calvarium, though not as pronounced. Soft tissues and spinal canal: No soft tissue neck lesion seen. Disc levels: Chronic degenerative spondylosis from C3-4 through C6-7. No apparent significant canal stenosis. Facet osteoarthritis most pronounced on the right at C5-6 and on the left at C2-3, C3-4 and C4-5. No severe bony foraminal stenosis. Upper chest: Negative Other: None IMPRESSION: HEAD CT: No acute or traumatic finding. Atrophy and chronic small-vessel ischemic changes. Atrophy is temporal lobe predominant. CERVICAL SPINE CT: No acute or traumatic finding. Chronic degenerative spondylosis and facet osteoarthritis. Electronically Signed   By: Nelson Chimes M.D.   On: 07/12/2020 13:48   CT Lumbar Spine Wo Contrast  Result Date: 07/12/2020 CLINICAL DATA:  Status post fall, low back pain EXAM: CT LUMBAR SPINE WITHOUT CONTRAST TECHNIQUE: Multidetector CT imaging of the lumbar spine was performed without intravenous contrast administration. Multiplanar CT image reconstructions were also generated. COMPARISON:  None. FINDINGS: Segmentation: 5 lumbar type vertebrae. Alignment: Grade 1 anterolisthesis of L5 on S1 secondary to bilateral L5 pars interarticularis defects. Osseous fusion across the L5-S1 disc space. Vertebrae: Severe osteopenia. L2 vertebral body compression fracture with approximately 50% height loss and 2 mm of focal central retropulsion without spinal stenosis. No aggressive osseous lesion. Paraspinal and other soft tissues: No acute paraspinal abnormality. Abdominal aortic atherosclerosis. Disc levels: Degenerative disease with disc height loss at L2-3, L3-4, and L4-5. Osseous fusion of the L5-S1 disc spaces. T12-L1: Broad-based disc osteophyte complex. No foraminal or central canal stenosis. L1-2:  Minimal broad-based disc bulge. No foraminal or central canal stenosis. Mild bilateral facet arthropathy. L2-3: Mild broad-based disc bulge. Mild bilateral facet arthropathy. No foraminal or central canal stenosis. L3-4: Broad-based disc bulge. Mild bilateral facet arthropathy. Mild left foraminal stenosis. No right foraminal stenosis. No central canal stenosis. L4-5: Broad-based disc osteophyte complex. Moderate bilateral facet arthropathy.  No definite foraminal or central canal stenosis. IMPRESSION: 1. Acute L2 vertebral body compression fracture with approximately 50% height loss and 2 mm of focal central retropulsion without spinal stenosis. 2. Diffuse lumbar spine spondylosis as described above. 3. Aortic Atherosclerosis (ICD10-I70.0). Electronically Signed   By: Kathreen Devoid   On: 07/12/2020 13:54   DG Hip Unilat W or Wo Pelvis 2-3 Views Right  Addendum Date: 07/13/2020   ADDENDUM REPORT: 07/13/2020 13:39 ADDENDUM: CORRECT EXAM: PELVIS AND RIGHT HIP: 2+ VIEW Electronically Signed   By: Lowella Grip III M.D.   On: 07/13/2020 13:39   Addendum Date: 07/13/2020   ADDENDUM REPORT: 07/12/2020 14:11 CORRECT EXAM: PELVIS AND RIGHT HIP: 2+ VIEW Electronically Signed   By: Lowella Grip III M.D.   On: 07/12/2020 14:11   Result Date: 07/13/2020 CLINICAL DATA:  Pain following fall EXAM: RIGHT KNEE - 1-2 VIEW COMPARISON:  June 26, 2018 FINDINGS: Frontal pelvis as well as frontal and lateral right hip images were obtained. Bones osteoporotic. No acute fracture or dislocation. There is moderate symmetric narrowing of each hip joint. No erosive change. There is degenerative change in the lower lumbar spine. There are multiple foci of arterial vascular calcification in the pelvis and thigh regions. IMPRESSION: Symmetric narrowing of each hip joint. No acute fracture or dislocation. Bones osteoporotic. Multiple foci of arterial vascular calcification noted. Electronically Signed: By: Lowella Grip  III M.D. On: 07/12/2020 14:07     ASSESSMENT AND PLAN:  1.  Anemia - declines blood transfusions, Jehovah's Witness 2.  Mild thrombocytopenia 3.  Acute L2 compression fracture secondary to fall 4.  Acute on chronic kidney disease 5.  Dementia 6.  Hypertension 7.  History of seizures  -The patient's labs been reviewed and she has a component of iron deficiency as well as B12 deficiency.  May possibly have underlying MDS.  Difficult situation in that she does not accept blood transfusions for religious reasons.  We will review peripheral blood smear later today.  Agree with vitamin B12 1000 mcg subcu daily for 1 week.  May also benefit from IV iron.  She does have a component of CKD and EPO may be considered.  Final recommendations per Dr. Marin Olp later today. -Continue management of other medical conditions per hospitalist.  Thank you for this referral.  Mikey Bussing, DNP, AGPCNP-BC, AOCNP Mon/Tues/Thurs/Fri 7am-5pm; Off Wednesdays Cell: [phone]  ADDENDUM: I saw and examined Ms. Cech.  She is a lovely 86 year old Afro-American woman.  She is originally from [place].  She has never been married.  She works for [employer] up in [place].  She is a Restaurant manager, fast food.  She was at her nursing home.  She fell.  She was admitted because of a fracture.  I think this was a spinal fracture.  She was found to be quite anemic.  Again, she is a Sales promotion account executive Witness and cannot accept blood transfusions.  When she was admitted, her hemoglobin was 5.3.  MCV was 84.  Her white cell count platelet count was okay.  Her corrected reticulocyte count was less than 1%.  She has iron deficiency.  Her saturation was 8%.  Her ferritin was only 32.  Her folic acid was 19.1.  Vitamin B12 is 196.  I looked at her peripheral smear.  I really do not see anything that looked suspicious for a underlying hematologic malignancy.  She had some mild anisocytosis and poikilocytosis.  I did not see any  nucleated red blood cells.  She had no schistocytes.  There is no rouleaux formation.  White blood cells did not show any immature myeloid or lymphoid cells.  She has no hypersegmented polys.  She has adequate lymphocytes.  Platelets were adequate number and size.  I think that the best way and only way that we can get her hemoglobin better is for the use of dose dense appropriate.  I the appropriate every 2 days would be reasonable.  I think this would be considered relatively standard.  The dose will be 40,000 units.  Her blood pressure will have to be watched.  She is on vitamin B12.  I will start folic acid.  We will see what her erythropoietin level is.  We will probably see her blood count improve after about 7-10 days.  I just do not see need for any invasive studies.  86 years old, she was can have myelodysplasia.  We will follow her.  I know she will get fantastic care from all staff on [address].   Lattie Haw, MD  [place] 12:12

## 2020-07-14 NOTE — Progress Notes (Addendum)
PROGRESS NOTE    Shawna Lin  IOE:[id number] DOB: [birth year] DOA: 07/12/2020 PCP: System, Provider Not In    Chief Complaint  Patient presents with  . Fall    Brief Narrative:  HPI per Dr. Truitt Leep is a 86 y.o. female with medical history significant of dementia, hypertension, seizure disorder, prolonged QTC, vitamin D deficiency who presented from the facility after witnessed mechanical fall.  As per ED providers documentation who spoke to the facility staff, patient was seen ambulating without her walker and before staff could get to the patient, she will was seen falling and hitting her head on the door.  No loss of consciousness was reported.  Patient is on baby aspirin.  Apparently patient was acting different since the fall and was less talkative and interactive than her baseline.  Later on her blood pressure was checked and was elevated at 170s over 100s.  Patient was complaining of low back pain and knee pain prior to EMS arrival.  No other history can be obtained because of patient being demented.  ED Course: She was hemodynamically stable.  She was found to have hemoglobin of 5.9, prior hemoglobin in February 2021 was 9.5.  She had a creatinine of 1.46.  She was found to have acute L2 vertebral body compression fracture with approximately 50% height loss and 2 mm of focal central repulsion without spinal stenosis.  ED provider spoke to on-call neurosurgery who stated that she would see the patient and would order lumbar brace.  CT of the head and cervical spine was negative for acute intracranial or cervical spinal injury.  Right knee x-ray was negative for any acute fracture.  Chest x-ray was negative for acute abnormality.  Fecal occult blood test was negative.  UA was negative as well.  ED provider ordered blood transfusion but apparently patient is a Jehovah's Witness and the niece/[name] refused blood transfusion for the patient as per ED providers  documentation. Hospitalist service was called to evaluate the patient.  Assessment & Plan:   Principal Problem:   L2 vertebral fracture (HCC) Active Problems:   HTN (hypertension)   Dementia (HCC)   Seizure disorder ([place])   Anemia   Fall   CKD (chronic kidney disease), stage III ([place])   AKI (acute kidney injury) ([place])   1 acute L2 compression fracture secondary to mechanical fall CT of the L-spine done on admission showed L2 vertebral body compression fracture with approximately 50% height loss, 2 mm of focal central repulsion without spinal stenosis.  CT head CT C-spine with no acute abnormalities.  Urinalysis negative.  COVID-19 PCR negative.  Place on scheduled Tylenol twice daily.  Neurosurgery consulted who recommended lumbar brace, pain management, conservative treatment, PT/OT, outpatient follow-up.  2.  Anemia/low vitamin B12 levels Patient noted to have a hemoglobin of 5.9 on admission..  Last hemoglobin February 2021 was 9.5.  FOBT negative.  Patient noted to be a Jehovah's Witness and refusing blood products at this time and per admitting physician per patient's niece/[name].  TSH within normal limits.  Vitamin B12 of 196.  Anemia panel with iron of 34, TIBC of 4 7, folate of 15.3.  Ferritin level at 32.  Hemoglobin currently at 5.3.  May have also dilutional effect.  Saline lock IV fluids.  Check a reticulocyte count.  Save smear.  We will give a dose of IV iron.  Will consult with hematology for further evaluation and management.   3.  Acute kidney injury on chronic  kidney disease stage IIIa Creatinine on presentation was 1.46.  2021 was 1.04.  Creatinine currently at 1.47.  Saline lock IV fluids.  Follow.   4.  Dementia Stable.  Continue home regimen sertraline, olanzapine.  Palliative care consulted for goals of care.  5.  Hypertension Lasix on hold.  Blood pressure improved on increased dose of Norvasc 5 mg daily which we will continue for now.   6.  History of  seizures Stable.  Continue home regimen Keppra.    DVT prophylaxis: SCDs Code Status: Full Family Communication: Updated patient.  Tried to call niece [name] but phone went to voicemail.  Disposition:   Status is: Inpatient    Dispo: The patient is from: Memory care unit/[facility]              Anticipated d/c is to: SNF versus back to memory care unit              Anticipated d/c date is: 1 to 2 days.              Patient currently with a L2 compression fracture, being evaluated by PT, pain being managed, patient anemic, not stable for discharge.       Consultants:   Neurosurgery: Ferne Reus, [place] 07/12/2020  Hematology: Pending  Palliative care: Dr. Domingo Cocking 07/13/2020  Procedures:   CT L-spine 07/12/2020  CT head CT C-spine 07/12/2020  Plain films of the pelvis and right hip 07/12/2020  Antimicrobials:   None   Subjective: Patient sitting up in chair.  Denies any chest pain.  No shortness of breath.  No abdominal pain.  No bleeding noted.  No dizziness.  No syncope.  Objective: Vitals:   07/13/20 1336 07/13/20 1844 07/13/20 2028 07/14/20 0552  BP: (!) 103/54 (!) 171/84 (!) 155/57 (!) 141/64  Pulse: 71 93 72 72  Resp: 16 17 16 16   Temp: 98.1 F (36.7 C) 98.6 F (37 C) 98.1 F (36.7 C) 97.7 F (36.5 C)  TempSrc: Oral Oral Oral Oral  SpO2: 99% 99% 100% 99%  Weight:      Height:        Intake/Output Summary (Last 24 hours) at 07/14/2020 1317 Last data filed at 07/14/2020 1100 Gross per 24 hour  Intake 1133.12 ml  Output --  Net 1133.12 ml   Filed Weights   07/13/20 0100  Weight: 75.2 kg    Examination:  General exam: Appears calm and comfortable.  LSO brace on Respiratory system: CTAB no wheezes, no crackles, no rhonchi.  Normal respiratory effort.  Cardiovascular system: Regular rate rhythm no murmurs rubs or gallops.  No JVD.  No lower extremity edema.  Gastrointestinal system: Abdomen is soft, nontender, nondistended, positive  bowel sounds.  No rebound.  No guarding.  Central nervous system: Alert and oriented. No focal neurological deficits. Extremities: Symmetric 5 x 5 power. Skin: No rashes, lesions or ulcers Psychiatry: Judgement and insight appear normal. Mood & affect appropriate.     Data Reviewed: I have personally reviewed following labs and imaging studies  CBC: Recent Labs  Lab 07/12/20 1415 07/12/20 1723 07/13/20 0307 07/14/20 0335  WBC 6.7 7.2 8.8 6.1  NEUTROABS 4.2 4.6  --   --   HGB 5.9* 5.7* 5.7* 5.3*  HCT 20.4* 20.0* 20.1* 18.7*  MCV 82.3 81.6 83.8 84.2  PLT 180 205 186 146*    Basic Metabolic Panel: Recent Labs  Lab 07/12/20 1415 07/13/20 0307 07/14/20 0335  NA 143 142 141  K 4.3  4.8 3.8  CL 106 108 109  CO2 26 24 22   GLUCOSE 107* 91 82  BUN 21 20 22   CREATININE 1.46* 1.48* 1.47*  CALCIUM 9.7 9.5 9.3  MG  --   --  1.9    GFR: Estimated Creatinine Clearance: 22.9 mL/min (A) (by C-G formula based on SCr of 1.47 mg/dL (H)).  Liver Function Tests: Recent Labs  Lab 07/13/20 0307  AST 26  ALT 14  ALKPHOS 129*  BILITOT 0.3  PROT 6.2*  ALBUMIN 3.9    CBG: No results for input(s): GLUCAP in the last 168 hours.   Recent Results (from the past 240 hour(s))  Resp Panel by RT-PCR (Flu A&B, Covid) Nasopharyngeal Swab     Status: None   Collection Time: 07/12/20  5:23 PM   Specimen: Nasopharyngeal Swab; Nasopharyngeal(NP) swabs in vial transport medium  Result Value Ref Range Status   SARS Coronavirus 2 by RT PCR NEGATIVE NEGATIVE Final    Comment: (NOTE) SARS-CoV-2 target nucleic acids are NOT DETECTED.  The SARS-CoV-2 RNA is generally detectable in upper respiratory specimens during the acute phase of infection. The lowest concentration of SARS-CoV-2 viral copies this assay can detect is 138 copies/mL. A negative result does not preclude SARS-Cov-2 infection and should not be used as the sole basis for treatment or other patient management decisions. A  negative result may occur with  improper specimen collection/handling, submission of specimen other than nasopharyngeal swab, presence of viral mutation(s) within the areas targeted by this assay, and inadequate number of viral copies(<138 copies/mL). A negative result must be combined with clinical observations, patient history, and epidemiological information. The expected result is Negative.  Fact Sheet for Patients:  EntrepreneurPulse.com.au  Fact Sheet for Healthcare Providers:  IncredibleEmployment.be  This test is no t yet approved or cleared by the [place] FDA and  has been authorized for detection and/or diagnosis of SARS-CoV-2 by FDA under an Emergency Use Authorization (EUA). This EUA will remain  in effect (meaning this test can be used) for the duration of the COVID-19 declaration under Section 564(b)(1) of the Act, 21 U.S.C.section 360bbb-3(b)(1), unless the authorization is terminated  or revoked sooner.       Influenza A by PCR NEGATIVE NEGATIVE Final   Influenza B by PCR NEGATIVE NEGATIVE Final    Comment: (NOTE) The Xpert Xpress SARS-CoV-2/FLU/RSV plus assay is intended as an aid in the diagnosis of influenza from Nasopharyngeal swab specimens and should not be used as a sole basis for treatment. Nasal washings and aspirates are unacceptable for Xpert Xpress SARS-CoV-2/FLU/RSV testing.  Fact Sheet for Patients: EntrepreneurPulse.com.au  Fact Sheet for Healthcare Providers: IncredibleEmployment.be  This test is not yet approved or cleared by the [place] FDA and has been authorized for detection and/or diagnosis of SARS-CoV-2 by FDA under an Emergency Use Authorization (EUA). This EUA will remain in effect (meaning this test can be used) for the duration of the COVID-19 declaration under Section 564(b)(1) of the Act, 21 U.S.C. section 360bbb-3(b)(1), unless the authorization  is terminated or revoked.  Performed at [facility], [place] [address] [place], [place] [address]          Radiology Studies: DG Chest 2 View  Result Date: 07/12/2020 CLINICAL DATA:  Recent fall.  Hypertension EXAM: CHEST - 2 VIEW COMPARISON:  Nov 22, 2019 FINDINGS: The lungs are clear. Heart is upper normal in size with pulmonary vascularity normal. No adenopathy. No pneumothorax. There is degenerative change in the lower  thoracic spine and in each shoulder. IMPRESSION: Lungs clear.  Heart upper normal in size. Electronically Signed   By: Lowella Grip III M.D.   On: 07/12/2020 14:05   DG Knee 1-2 Views Right  Result Date: 07/12/2020 CLINICAL DATA:  Right knee pain after fall at nursing home. EXAM: RIGHT KNEE - 1-2 VIEW COMPARISON:  None. FINDINGS: No acute fracture or dislocation. There is patellar [place]. Moderate osteoarthritis with tricompartmental peripheral spurring, medial tibiofemoral joint space narrowing, and small knee joint effusion. Bones are diffusely under mineralized. Small quadriceps tendon enthesophyte. There are vascular calcifications. IMPRESSION: 1. No acute fracture or dislocation of the right knee. 2. Moderate osteoarthritis and small joint effusion. Patella [place]. Electronically Signed   By: Keith Rake M.D.   On: 07/12/2020 19:50   DG Knee 2 Views Right  Addendum Date: 07/12/2020   ADDENDUM REPORT: 07/12/2020 14:11 CORRECT EXAM: PELVIS AND RIGHT HIP: 2+ VIEW Electronically Signed   By: Lowella Grip III M.D.   On: 07/12/2020 14:11   Result Date: 07/12/2020 CLINICAL DATA:  Pain following fall EXAM: RIGHT KNEE - 1-2 VIEW COMPARISON:  June 26, 2018 FINDINGS: Frontal pelvis as well as frontal and lateral right hip images were obtained. Bones osteoporotic. No acute fracture or dislocation. There is moderate symmetric narrowing of each hip joint. No erosive change. There is degenerative change in the lower lumbar spine. There are  multiple foci of arterial vascular calcification in the pelvis and thigh regions. IMPRESSION: Symmetric narrowing of each hip joint. No acute fracture or dislocation. Bones osteoporotic. Multiple foci of arterial vascular calcification noted. Electronically Signed: By: Lowella Grip III M.D. On: 07/12/2020 14:07   CT Head Wo Contrast  Result Date: 07/12/2020 CLINICAL DATA:  [place] today with trauma to the head and neck. EXAM: CT HEAD WITHOUT CONTRAST CT CERVICAL SPINE WITHOUT CONTRAST TECHNIQUE: Multidetector CT imaging of the head and cervical spine was performed following the standard protocol without intravenous contrast. Multiplanar CT image reconstructions of the cervical spine were also generated. COMPARISON:  08/27/2019 MRI. FINDINGS: CT HEAD FINDINGS Brain: Generalized atrophy, with temporal lobe predominance. Mild chronic small-vessel ischemic changes of the white matter. No sign of acute infarction, mass lesion, hemorrhage, hydrocephalus or extra-axial collection. Vascular: There is atherosclerotic calcification of the major vessels at the base of the brain. Skull: Chronic mild old appearance of the calvarium which could be due to demineralization, anemia or infiltrating marrow process. Lack of progressive change since January of this year argues against an aggressive process. History describes anemia and vitamin-D deficiency, which could relate to this finding. Sinuses/Orbits: Clear/normal Other: None CT CERVICAL SPINE FINDINGS Alignment: Curvature convex to the right. 2 mm degenerative anterolisthesis at C5-6. Skull base and vertebrae: No fracture. Mild appearance of the bone, similar to the calvarium, though not as pronounced. Soft tissues and spinal canal: No soft tissue neck lesion seen. Disc levels: Chronic degenerative spondylosis from C3-4 through C6-7. No apparent significant canal stenosis. Facet osteoarthritis most pronounced on the right at C5-6 and on the left at C2-3, C3-4 and C4-5. No  severe bony foraminal stenosis. Upper chest: Negative Other: None IMPRESSION: HEAD CT: No acute or traumatic finding. Atrophy and chronic small-vessel ischemic changes. Atrophy is temporal lobe predominant. CERVICAL SPINE CT: No acute or traumatic finding. Chronic degenerative spondylosis and facet osteoarthritis. Electronically Signed   By: Nelson Chimes M.D.   On: 07/12/2020 13:48   CT Cervical Spine Wo Contrast  Result Date: 07/12/2020 CLINICAL DATA:  [place] today with  trauma to the head and neck. EXAM: CT HEAD WITHOUT CONTRAST CT CERVICAL SPINE WITHOUT CONTRAST TECHNIQUE: Multidetector CT imaging of the head and cervical spine was performed following the standard protocol without intravenous contrast. Multiplanar CT image reconstructions of the cervical spine were also generated. COMPARISON:  08/27/2019 MRI. FINDINGS: CT HEAD FINDINGS Brain: Generalized atrophy, with temporal lobe predominance. Mild chronic small-vessel ischemic changes of the white matter. No sign of acute infarction, mass lesion, hemorrhage, hydrocephalus or extra-axial collection. Vascular: There is atherosclerotic calcification of the major vessels at the base of the brain. Skull: Chronic mild old appearance of the calvarium which could be due to demineralization, anemia or infiltrating marrow process. Lack of progressive change since January of this year argues against an aggressive process. History describes anemia and vitamin-D deficiency, which could relate to this finding. Sinuses/Orbits: Clear/normal Other: None CT CERVICAL SPINE FINDINGS Alignment: Curvature convex to the right. 2 mm degenerative anterolisthesis at C5-6. Skull base and vertebrae: No fracture. Mild appearance of the bone, similar to the calvarium, though not as pronounced. Soft tissues and spinal canal: No soft tissue neck lesion seen. Disc levels: Chronic degenerative spondylosis from C3-4 through C6-7. No apparent significant canal stenosis. Facet osteoarthritis  most pronounced on the right at C5-6 and on the left at C2-3, C3-4 and C4-5. No severe bony foraminal stenosis. Upper chest: Negative Other: None IMPRESSION: HEAD CT: No acute or traumatic finding. Atrophy and chronic small-vessel ischemic changes. Atrophy is temporal lobe predominant. CERVICAL SPINE CT: No acute or traumatic finding. Chronic degenerative spondylosis and facet osteoarthritis. Electronically Signed   By: Nelson Chimes M.D.   On: 07/12/2020 13:48   CT Lumbar Spine Wo Contrast  Result Date: 07/12/2020 CLINICAL DATA:  Status post fall, low back pain EXAM: CT LUMBAR SPINE WITHOUT CONTRAST TECHNIQUE: Multidetector CT imaging of the lumbar spine was performed without intravenous contrast administration. Multiplanar CT image reconstructions were also generated. COMPARISON:  None. FINDINGS: Segmentation: 5 lumbar type vertebrae. Alignment: Grade 1 anterolisthesis of L5 on S1 secondary to bilateral L5 pars interarticularis defects. Osseous fusion across the L5-S1 disc space. Vertebrae: Severe osteopenia. L2 vertebral body compression fracture with approximately 50% height loss and 2 mm of focal central retropulsion without spinal stenosis. No aggressive osseous lesion. Paraspinal and other soft tissues: No acute paraspinal abnormality. Abdominal aortic atherosclerosis. Disc levels: Degenerative disease with disc height loss at L2-3, L3-4, and L4-5. Osseous fusion of the L5-S1 disc spaces. T12-L1: Broad-based disc osteophyte complex. No foraminal or central canal stenosis. L1-2: Minimal broad-based disc bulge. No foraminal or central canal stenosis. Mild bilateral facet arthropathy. L2-3: Mild broad-based disc bulge. Mild bilateral facet arthropathy. No foraminal or central canal stenosis. L3-4: Broad-based disc bulge. Mild bilateral facet arthropathy. Mild left foraminal stenosis. No right foraminal stenosis. No central canal stenosis. L4-5: Broad-based disc osteophyte complex. Moderate bilateral facet  arthropathy. No definite foraminal or central canal stenosis. IMPRESSION: 1. Acute L2 vertebral body compression fracture with approximately 50% height loss and 2 mm of focal central retropulsion without spinal stenosis. 2. Diffuse lumbar spine spondylosis as described above. 3. Aortic Atherosclerosis (ICD10-I70.0). Electronically Signed   By: Kathreen Devoid   On: 07/12/2020 13:54   DG Hip Unilat W or Wo Pelvis 2-3 Views Right  Addendum Date: 07/13/2020   ADDENDUM REPORT: 07/13/2020 13:39 ADDENDUM: CORRECT EXAM: PELVIS AND RIGHT HIP: 2+ VIEW Electronically Signed   By: Lowella Grip III M.D.   On: 07/13/2020 13:39   Addendum Date: 07/13/2020   ADDENDUM REPORT:  07/12/2020 14:11 CORRECT EXAM: PELVIS AND RIGHT HIP: 2+ VIEW Electronically Signed   By: Lowella Grip III M.D.   On: 07/12/2020 14:11   Result Date: 07/13/2020 CLINICAL DATA:  Pain following fall EXAM: RIGHT KNEE - 1-2 VIEW COMPARISON:  June 26, 2018 FINDINGS: Frontal pelvis as well as frontal and lateral right hip images were obtained. Bones osteoporotic. No acute fracture or dislocation. There is moderate symmetric narrowing of each hip joint. No erosive change. There is degenerative change in the lower lumbar spine. There are multiple foci of arterial vascular calcification in the pelvis and thigh regions. IMPRESSION: Symmetric narrowing of each hip joint. No acute fracture or dislocation. Bones osteoporotic. Multiple foci of arterial vascular calcification noted. Electronically Signed: By: Lowella Grip III M.D. On: 07/12/2020 14:07        Scheduled Meds: . acetaminophen  500 mg Oral BID  . amLODipine  5 mg Oral Daily  . cyanocobalamin  1,000 mcg Subcutaneous Daily  . feeding supplement  237 mL Oral BID BM  . hydrocerin   Topical BID  . levETIRAcetam  500 mg Oral BID  . OLANZapine  2.5 mg Oral QHS  . pantoprazole  40 mg Oral Q0600  . senna-docusate  1 tablet Oral QHS  . sertraline  100 mg Oral Daily   Continuous  Infusions: . sodium chloride       LOS: 1 day    Time spent: 40 minutes    Irine Seal, MD [facility]   To contact the attending provider between 7A-7P or the covering provider during after hours 7P-7A, please log into the web site [email or web address] and access using universal [place] password for that web site. If you do not have the password, please call the hospital operator.  07/14/2020, 1:17 PM

## 2020-07-14 NOTE — TOC Progression Note (Signed)
Transition of Care [facility]) - Progression Note    Patient Details  Name: Shawna Lin MRN: [id number] Date of Birth: [birth year]  Transition of Care [facility]) CM/SW [place], [place] Phone Number: 07/14/2020, 2:02 PM  Clinical Narrative:    CSW provided a list of SNF bed offers to Carlyle Basques.    Expected Discharge Plan: [place] Barriers to Discharge: Continued Medical Work up  Expected Discharge Plan and Services Expected Discharge Plan: [place] In-house Referral: Clinical Social Work Discharge Planning Services: CM Consult Post Acute Care Choice: [place] arrangements for the past 2 months: [place] (Memory Care)                                       Social Determinants of Health (SDOH) Interventions    Readmission Risk Interventions No flowsheet data found.

## 2020-07-15 DIAGNOSIS — N179 Acute kidney failure, unspecified: Secondary | ICD-10-CM | POA: Diagnosis not present

## 2020-07-15 DIAGNOSIS — D649 Anemia, unspecified: Secondary | ICD-10-CM | POA: Diagnosis not present

## 2020-07-15 DIAGNOSIS — N1831 Chronic kidney disease, stage 3a: Secondary | ICD-10-CM | POA: Diagnosis not present

## 2020-07-15 DIAGNOSIS — S32020D Wedge compression fracture of second lumbar vertebra, subsequent encounter for fracture with routine healing: Secondary | ICD-10-CM | POA: Diagnosis not present

## 2020-07-15 DIAGNOSIS — S32021D Stable burst fracture of second lumbar vertebra, subsequent encounter for fracture with routine healing: Secondary | ICD-10-CM | POA: Diagnosis not present

## 2020-07-15 LAB — RENAL FUNCTION PANEL
Albumin: 3.6 g/dL (ref 3.5–5.0)
Anion gap: 8 (ref 5–15)
BUN: 19 mg/dL (ref 8–23)
CO2: 25 mmol/L (ref 22–32)
Calcium: 9.2 mg/dL (ref 8.9–10.3)
Chloride: 109 mmol/L (ref 98–111)
Creatinine, Ser: 1.48 mg/dL — ABNORMAL HIGH (ref 0.44–1.00)
GFR, Estimated: 34 mL/min — ABNORMAL LOW (ref >60)
Glucose, Bld: 84 mg/dL (ref 70–99)
Phosphorus: 3.2 mg/dL (ref 2.5–4.6)
Potassium: 3.8 mmol/L (ref 3.5–5.1)
Sodium: 142 mmol/L (ref 135–145)

## 2020-07-15 LAB — CBC WITH DIFFERENTIAL/PLATELET
Abs Immature Granulocytes: 0.09 10*3/uL — ABNORMAL HIGH (ref 0.00–0.07)
Basophils Absolute: 0 10*3/uL (ref 0.0–0.1)
Basophils Relative: 0 %
Eosinophils Absolute: 0.2 10*3/uL (ref 0.0–0.5)
Eosinophils Relative: 3 %
HCT: 18.1 % — ABNORMAL LOW (ref 36.0–46.0)
Hemoglobin: 5.3 g/dL — CL (ref 12.0–15.0)
Immature Granulocytes: 2 %
Lymphocytes Relative: 31 %
Lymphs Abs: 1.7 10*3/uL (ref 0.7–4.0)
MCH: 23.8 pg — ABNORMAL LOW (ref 26.0–34.0)
MCHC: 29.3 g/dL — ABNORMAL LOW (ref 30.0–36.0)
MCV: 81.2 fL (ref 80.0–100.0)
Monocytes Absolute: 0.5 10*3/uL (ref 0.1–1.0)
Monocytes Relative: 9 %
Neutro Abs: 3.2 10*3/uL (ref 1.7–7.7)
Neutrophils Relative %: 55 %
Platelets: 143 10*3/uL — ABNORMAL LOW (ref 150–400)
RBC: 2.23 MIL/uL — ABNORMAL LOW (ref 3.87–5.11)
RDW: 22.8 % — ABNORMAL HIGH (ref 11.5–15.5)
WBC: 5.7 10*3/uL (ref 4.0–10.5)
nRBC: 2.8 % — ABNORMAL HIGH (ref 0.0–0.2)

## 2020-07-15 NOTE — Progress Notes (Signed)
Overall, Ms. Kronick is about the same.  Her hemoglobin is still 5.3.  Her white cell count 5.7.  Platelet count 143,000.  We will start her on the every other day Procrit.  She is getting vitamin X77 and folic acid.  I would not expect her blood counts are improving for another week at the earliest.  Again, there may be some underlying myelodysplasia.  When I looked at her blood smear, she did have significant microcytic and hypochromic red blood cells.  White cells look normal in morphology.  There is no immature myeloid cells.  She has a couple nucleated red blood cells.  Again, everything is holding pretty stable.  We will does have to keep watch over the hemoglobin.  Lattie Haw, MD  Lurena Joiner 1:37

## 2020-07-15 NOTE — Progress Notes (Signed)
PROGRESS NOTE    Shawna Lin  LGX:[id number] DOB: Nov 01, 1928 DOA: 07/12/2020 PCP: System, Provider Not In    Chief Complaint  Patient presents with  . Fall    Brief Narrative:  HPI per Dr. Truitt Leep is a 86 y.o. female with medical history significant of dementia, hypertension, seizure disorder, prolonged QTC, vitamin D deficiency who presented from the facility after witnessed mechanical fall.  As per ED providers documentation who spoke to the facility staff, patient was seen ambulating without her walker and before staff could get to the patient, she will was seen falling and hitting her head on the door.  No loss of consciousness was reported.  Patient is on baby aspirin.  Apparently patient was acting different since the fall and was less talkative and interactive than her baseline.  Later on her blood pressure was checked and was elevated at 170s over 100s.  Patient was complaining of low back pain and knee pain prior to EMS arrival.  No other history can be obtained because of patient being demented.  ED Course: She was hemodynamically stable.  She was found to have hemoglobin of 5.9, prior hemoglobin in February 2021 was 9.5.  She had a creatinine of 1.46.  She was found to have acute L2 vertebral body compression fracture with approximately 50% height loss and 2 mm of focal central repulsion without spinal stenosis.  ED provider spoke to on-call neurosurgery who stated that she would see the patient and would order lumbar brace.  CT of the head and cervical spine was negative for acute intracranial or cervical spinal injury.  Right knee x-ray was negative for any acute fracture.  Chest x-ray was negative for acute abnormality.  Fecal occult blood test was negative.  UA was negative as well.  ED provider ordered blood transfusion but apparently patient is a Jehovah's Witness and the niece/[name] refused blood transfusion for the patient as per ED providers  documentation. Hospitalist service was called to evaluate the patient.  Assessment & Plan:   Principal Problem:   L2 vertebral fracture (HCC) Active Problems:   HTN (hypertension)   Dementia (HCC)   Seizure disorder ([place])   Anemia   Fall   CKD (chronic kidney disease), stage III ([place])   AKI (acute kidney injury) ([place])   1 acute L2 compression fracture secondary to mechanical fall CT of the L-spine done on admission showed L2 vertebral body compression fracture with approximately 50% height loss, 2 mm of focal central repulsion without spinal stenosis.  CT head CT C-spine with no acute abnormalities.  Urinalysis negative.  COVID-19 PCR negative.  Place on scheduled Tylenol twice daily.  Neurosurgery consulted who recommended lumbar brace, pain management, conservative treatment, PT/OT, outpatient follow-up.  2.  Anemia/low vitamin B12 levels Patient noted to have a hemoglobin of 5.9 on admission..  Last hemoglobin February 2021 was 9.5.  FOBT negative.  Patient noted to be a Jehovah's Witness and refusing blood products at this time and per admitting physician per patient's niece/[name].  TSH within normal limits.  Vitamin B12 of 196.  Anemia panel with iron of 34, TIBC of 4 7, folate of 15.3.  Ferritin level at 32.  Hemoglobin currently at 5.3.  IV fluids have been saline locked.  Patient seen in consultation by hematology, Dr. Marin Olp who reviewed blood smear and noted patient to have significant microcytic and hypochromic red blood cells, normal white cell morphology, no immature myeloid cells, couple of nucleated red blood cells.  Hematology has started patient on Procrit every other day.  Continue vitamin B12 supplementation, folic acid.  Status post IV iron.  Hematology following and appreciate input and recommendations.   3.  Acute kidney injury on chronic kidney disease stage IIIa Creatinine on presentation was 1.46.  2021 was 1.04.  Below 1.48.  IV fluids have been saline lock.  Lasix  on hold.  Follow.   4.  Dementia Stable.  Continue home regimen sertraline, olanzapine.  Palliative care consulted for goals of care.  5.  Hypertension Lasix on hold.  Stable on Norvasc 5 mg daily.  Follow.   6.  History of seizures Stable.  Keppra.    DVT prophylaxis: SCDs Code Status: Full Family Communication: Updated patient.  Updated niece [name] at bedside.  Disposition:   Status is: Inpatient    Dispo: The patient is from: Memory care unit/[facility]              Anticipated d/c is to: SNF versus back to memory care unit              Anticipated d/c date is: 07/18/2020              Patient currently with a L2 compression fracture, being evaluated by PT, pain being managed, patient anemic, not stable for discharge.       Consultants:   Neurosurgery: Ferne Reus, [place] 07/12/2020  Hematology: Dr. Marin Olp 07/14/2020  Palliative care: Dr. Domingo Cocking 07/13/2020  Procedures:   CT L-spine 07/12/2020  CT head CT C-spine 07/12/2020  Plain films of the pelvis and right hip 07/12/2020  Antimicrobials:   None   Subjective: Sitting up in chair.  No chest pain.  No shortness of breath.  No abdominal pain.  No overt bleeding noted.  No dizziness.  Denies any syncopal episode.  States she is feeling okay.  States pain currently controlled.  Niece at bedside.    Objective: Vitals:   07/14/20 0552 07/14/20 1355 07/14/20 2136 07/15/20 0559  BP: (!) 141/64 135/85 (!) 163/79 (!) 163/79  Pulse: 72 66 64 68  Resp: 16 16 16 16   Temp: 97.7 F (36.5 C) 98.4 F (36.9 C) 98.9 F (37.2 C) 98.8 F (37.1 C)  TempSrc: Oral Oral    SpO2: 99% 100% 99% 99%  Weight:      Height:        Intake/Output Summary (Last 24 hours) at 07/15/2020 1305 Last data filed at 07/15/2020 0600 Gross per 24 hour  Intake 460 ml  Output 250 ml  Net 210 ml   Filed Weights   07/13/20 0100  Weight: 75.2 kg    Examination:  General exam: NAD.  LSO brace on Respiratory system: Lungs  clear to auscultation bilaterally.  No wheezes, no crackles, no rhonchi.  Normal respiratory effort. Cardiovascular system: RRR no murmurs rubs or gallops.  No JVD.  No lower extremity edema.  Gastrointestinal system: Abdomen is soft, nontender, nondistended, positive bowel sounds.  No rebound.  No guarding. Central nervous system: Alert and oriented. No focal neurological deficits. Extremities: Symmetric 5 x 5 power. Skin: No rashes, lesions or ulcers Psychiatry: Judgement and insight appear normal. Mood & affect appropriate.     Data Reviewed: I have personally reviewed following labs and imaging studies  CBC: Recent Labs  Lab 07/12/20 1415 07/12/20 1723 07/13/20 0307 07/14/20 0335 07/15/20 0335  WBC 6.7 7.2 8.8 6.1 5.7  NEUTROABS 4.2 4.6  --   --  3.2  HGB 5.9* 5.7*  5.7* 5.3* 5.3*  HCT 20.4* 20.0* 20.1* 18.7* 18.1*  MCV 82.3 81.6 83.8 84.2 81.2  PLT 180 205 186 146* 143*    Basic Metabolic Panel: Recent Labs  Lab 07/12/20 1415 07/13/20 0307 07/14/20 0335 07/15/20 0335  NA 143 142 141 142  K 4.3 4.8 3.8 3.8  CL 106 108 109 109  CO2 26 24 22 25   GLUCOSE 107* 91 82 84  BUN 21 20 22 19   CREATININE 1.46* 1.48* 1.47* 1.48*  CALCIUM 9.7 9.5 9.3 9.2  MG  --   --  1.9  --   PHOS  --   --   --  3.2    GFR: Estimated Creatinine Clearance: 22.8 mL/min (A) (by C-G formula based on SCr of 1.48 mg/dL (H)).  Liver Function Tests: Recent Labs  Lab 07/13/20 0307 07/15/20 0335  AST 26  --   ALT 14  --   ALKPHOS 129*  --   BILITOT 0.3  --   PROT 6.2*  --   ALBUMIN 3.9 3.6    CBG: No results for input(s): GLUCAP in the last 168 hours.   Recent Results (from the past 240 hour(s))  Resp Panel by RT-PCR (Flu A&B, Covid) Nasopharyngeal Swab     Status: None   Collection Time: 07/12/20  5:23 PM   Specimen: Nasopharyngeal Swab; Nasopharyngeal(NP) swabs in vial transport medium  Result Value Ref Range Status   SARS Coronavirus 2 by RT PCR NEGATIVE NEGATIVE Final     Comment: (NOTE) SARS-CoV-2 target nucleic acids are NOT DETECTED.  The SARS-CoV-2 RNA is generally detectable in upper respiratory specimens during the acute phase of infection. The lowest concentration of SARS-CoV-2 viral copies this assay can detect is 138 copies/mL. A negative result does not preclude SARS-Cov-2 infection and should not be used as the sole basis for treatment or other patient management decisions. A negative result may occur with  improper specimen collection/handling, submission of specimen other than nasopharyngeal swab, presence of viral mutation(s) within the areas targeted by this assay, and inadequate number of viral copies(<138 copies/mL). A negative result must be combined with clinical observations, patient history, and epidemiological information. The expected result is Negative.  Fact Sheet for Patients:  EntrepreneurPulse.com.au  Fact Sheet for Healthcare Providers:  IncredibleEmployment.be  This test is no t yet approved or cleared by the [place] FDA and  has been authorized for detection and/or diagnosis of SARS-CoV-2 by FDA under an Emergency Use Authorization (EUA). This EUA will remain  in effect (meaning this test can be used) for the duration of the COVID-19 declaration under Section 564(b)(1) of the Act, 21 U.S.C.section 360bbb-3(b)(1), unless the authorization is terminated  or revoked sooner.       Influenza A by PCR NEGATIVE NEGATIVE Final   Influenza B by PCR NEGATIVE NEGATIVE Final    Comment: (NOTE) The Xpert Xpress SARS-CoV-2/FLU/RSV plus assay is intended as an aid in the diagnosis of influenza from Nasopharyngeal swab specimens and should not be used as a sole basis for treatment. Nasal washings and aspirates are unacceptable for Xpert Xpress SARS-CoV-2/FLU/RSV testing.  Fact Sheet for Patients: EntrepreneurPulse.com.au  Fact Sheet for Healthcare  Providers: IncredibleEmployment.be  This test is not yet approved or cleared by the [place] FDA and has been authorized for detection and/or diagnosis of SARS-CoV-2 by FDA under an Emergency Use Authorization (EUA). This EUA will remain in effect (meaning this test can be used) for the duration of the COVID-19 declaration under Section  564(b)(1) of the Act, 21 U.S.C. section 360bbb-3(b)(1), unless the authorization is terminated or revoked.  Performed at [facility], [place] [address], [place], [place] [address]          Radiology Studies: No results found.      Scheduled Meds: . acetaminophen  500 mg Oral BID  . amLODipine  5 mg Oral Daily  . cyanocobalamin  1,000 mcg Subcutaneous Daily  . epoetin alfa  40,000 Units Subcutaneous Q48H  . feeding supplement  237 mL Oral BID BM  . folic acid  2 mg Oral Daily  . hydrocerin   Topical BID  . levETIRAcetam  500 mg Oral BID  . OLANZapine  2.5 mg Oral QHS  . pantoprazole  40 mg Oral Q0600  . senna-docusate  1 tablet Oral QHS  . sertraline  100 mg Oral Daily   Continuous Infusions: . sodium chloride       LOS: 2 days    Time spent: 35 minutes    Irine Seal, MD [facility]   To contact the attending provider between 7A-7P or the covering provider during after hours 7P-7A, please log into the web site [email or web address] and access using universal [place] password for that web site. If you do not have the password, please call the hospital operator.  07/15/2020, 1:05 PM

## 2020-07-15 NOTE — Progress Notes (Signed)
Physical Therapy Treatment Patient Details Name: Shawna Lin MRN: [id number] DOB: 01-11-1929 Today's Date: 07/15/2020    History of Present Illness 86 y.o. female with medical history significant of dementia, hypertension, seizure disorder, prolonged QTC, vitamin D deficiency who presented from the facility after witnessed mechanical fall.  Pt admitted for acute L2 compression fracture secondary to possible mechanical fall and anemia    PT Comments     Pt with wet bed linen so assisted pt to [facility] and then transferred to recliner.  Pt requiring a little support for weakness and steadying.  Pt remains pleasant and cooperative.  Continue to recommend SNF upon d/c.   Follow Up Recommendations  SNF     Equipment Recommendations  None recommended by PT    Recommendations for Other Services       Precautions / Restrictions Precautions Precautions: Fall Required Braces or Orthoses: Spinal Brace Spinal Brace: Applied in sitting position;Lumbar corset (LSO)    Mobility  Bed Mobility Overal bed mobility: Needs Assistance Bed Mobility: Supine to Sit     Supine to sit: Min assist     General bed mobility comments: assist for trunk  Transfers Overall transfer level: Needs assistance Equipment used: 2 person hand held assist Transfers: Sit to/from [facility] Sit to Stand: Min assist;+2 safety/equipment Stand pivot transfers: +2 safety/equipment;Min assist       General transfer comment: multimodal cues for technique, pt reliant on UE support, stand pivot to [facility] first and then recliner  Ambulation/Gait                 Stairs             Wheelchair Mobility    Modified Rankin (Stroke Patients Only)       Balance Overall balance assessment: History of Falls;Needs assistance         Standing balance support: Bilateral upper extremity supported Standing balance-Leahy Scale: Poor Standing balance comment: reliant on UE support                             Cognition Arousal/Alertness: Awake/alert Behavior During Therapy: WFL for tasks assessed/performed Overall Cognitive Status: No family/caregiver present to determine baseline cognitive functioning Area of Impairment: Orientation;Safety/judgement                 Orientation Level: Disoriented to;Place;Time;Situation       Safety/Judgement: Decreased awareness of safety;Decreased awareness of deficits     General Comments: hx dementia, pt cooperative and following simple commands      Exercises      General Comments        Pertinent Vitals/Pain Pain Assessment: Faces Faces Pain Scale: Hurts little more Pain Location: Right thigh/groin Pain Descriptors / Indicators: Tightness;Grimacing Pain Intervention(s): Repositioned;Monitored during session    Home Living                      Prior Function            PT Goals (current goals can now be found in the care plan section) Progress towards PT goals: Progressing toward goals    Frequency    Min 3X/week      PT Plan Current plan remains appropriate    Co-evaluation              AM-PAC PT "6 Clicks" Mobility   Outcome Measure  Help needed turning from your back to your side while in  a flat bed without using bedrails?: A Little Help needed moving from lying on your back to sitting on the side of a flat bed without using bedrails?: A Little Help needed moving to and from a bed to a chair (including a wheelchair)?: A Little Help needed standing up from a chair using your arms (e.g., wheelchair or bedside chair)?: A Little Help needed to walk in hospital room?: A Lot Help needed climbing 3-5 steps with a railing? : A Lot 6 Click Score: 16    End of Session Equipment Utilized During Treatment: Gait belt;Back brace Activity Tolerance: Patient tolerated treatment well Patient left: in chair;with call bell/phone within reach;with chair alarm set Nurse  Communication: Mobility status PT Visit Diagnosis: Difficulty in walking, not elsewhere classified (R26.2);Muscle weakness (generalized) (M62.81)     Time: 1029-1050 PT Time Calculation (min) (ACUTE ONLY): 21 min  Charges:  $Therapeutic Activity: 8-22 mins                    [name] PT, DPT Acute Rehabilitation Services Pager: [phone] Office: [phone]   [name] 07/15/2020, 12:17 PM

## 2020-07-15 NOTE — TOC Progression Note (Signed)
Transition of Care [facility]) - Progression Note    Patient Details  Name: Shawna Lin MRN: [id number] Date of Birth: 1928-11-20  [facility] CM/SW [place], [place] Phone Number: 07/15/2020, 12:18 PM  Clinical Narrative:    Patient niece [name]  provided a copy of the Legal Guardian paperwork. CSW placed on the patient chart. Patient niece reports she would prefer the patient discharge to SNF in [place] so that she can closer to family. She reports the patient does not have any family in [place]. She provided CSW a list of three SNF's in the area where she has done research. [facility], [facility]. CSW reached out to the facilities and fax clinicals for review.  Patient niece reports the patient insurance will no longer switch to [place] it will remain Medicare.  CSW informed [name] about private pay for EMS transport. [name] will arrange private transport with stretcher service.   TOC staff will continue to follow this patient.   Expected Discharge Plan: [place] Barriers to Discharge: Continued Medical Work up  Expected Discharge Plan and Services Expected Discharge Plan: [place] In-house Referral: Clinical Social Work Discharge Planning Services: CM Consult Post Acute Care Choice: [place] arrangements for the past 2 months: [place] (Memory Care)                                       Social Determinants of Health (SDOH) Interventions    Readmission Risk Interventions No flowsheet data found.

## 2020-07-15 NOTE — Plan of Care (Signed)

## 2020-07-16 DIAGNOSIS — S32021D Stable burst fracture of second lumbar vertebra, subsequent encounter for fracture with routine healing: Secondary | ICD-10-CM | POA: Diagnosis not present

## 2020-07-16 DIAGNOSIS — N1831 Chronic kidney disease, stage 3a: Secondary | ICD-10-CM | POA: Diagnosis not present

## 2020-07-16 DIAGNOSIS — D649 Anemia, unspecified: Secondary | ICD-10-CM | POA: Diagnosis not present

## 2020-07-16 DIAGNOSIS — N179 Acute kidney failure, unspecified: Secondary | ICD-10-CM | POA: Diagnosis not present

## 2020-07-16 LAB — BASIC METABOLIC PANEL
Anion gap: 8 (ref 5–15)
BUN: 23 mg/dL (ref 8–23)
CO2: 26 mmol/L (ref 22–32)
Calcium: 9.1 mg/dL (ref 8.9–10.3)
Chloride: 108 mmol/L (ref 98–111)
Creatinine, Ser: 1.42 mg/dL — ABNORMAL HIGH (ref 0.44–1.00)
GFR, Estimated: 35 mL/min — ABNORMAL LOW (ref >60)
Glucose, Bld: 80 mg/dL (ref 70–99)
Potassium: 4.2 mmol/L (ref 3.5–5.1)
Sodium: 142 mmol/L (ref 135–145)

## 2020-07-16 LAB — CBC
HCT: 17.7 % — ABNORMAL LOW (ref 36.0–46.0)
Hemoglobin: 5.2 g/dL — CL (ref 12.0–15.0)
MCH: 24 pg — ABNORMAL LOW (ref 26.0–34.0)
MCHC: 29.4 g/dL — ABNORMAL LOW (ref 30.0–36.0)
MCV: 81.6 fL (ref 80.0–100.0)
Platelets: 143 10*3/uL — ABNORMAL LOW (ref 150–400)
RBC: 2.17 MIL/uL — ABNORMAL LOW (ref 3.87–5.11)
RDW: 22.3 % — ABNORMAL HIGH (ref 11.5–15.5)
WBC: 6.1 10*3/uL (ref 4.0–10.5)
nRBC: 3.1 % — ABNORMAL HIGH (ref 0.0–0.2)

## 2020-07-16 NOTE — Progress Notes (Signed)
PROGRESS NOTE    Shawna Lin  PYK:[id number] DOB: 1929-05-20 DOA: 07/12/2020 PCP: System, Provider Not In    Chief Complaint  Patient presents with  . Fall    Brief Narrative:  HPI per Dr. Truitt Leep is a 87 y.o. female with medical history significant of dementia, hypertension, seizure disorder, prolonged QTC, vitamin D deficiency who presented from the facility after witnessed mechanical fall.  As per ED providers documentation who spoke to the facility staff, patient was seen ambulating without her walker and before staff could get to the patient, she will was seen falling and hitting her head on the door.  No loss of consciousness was reported.  Patient is on baby aspirin.  Apparently patient was acting different since the fall and was less talkative and interactive than her baseline.  Later on her blood pressure was checked and was elevated at 170s over 100s.  Patient was complaining of low back pain and knee pain prior to EMS arrival.  No other history can be obtained because of patient being demented.  ED Course: She was hemodynamically stable.  She was found to have hemoglobin of 5.9, prior hemoglobin in February 2021 was 9.5.  She had a creatinine of 1.46.  She was found to have acute L2 vertebral body compression fracture with approximately 50% height loss and 2 mm of focal central repulsion without spinal stenosis.  ED provider spoke to on-call neurosurgery who stated that she would see the patient and would order lumbar brace.  CT of the head and cervical spine was negative for acute intracranial or cervical spinal injury.  Right knee x-ray was negative for any acute fracture.  Chest x-ray was negative for acute abnormality.  Fecal occult blood test was negative.  UA was negative as well.  ED provider ordered blood transfusion but apparently patient is a Jehovah's Witness and the niece/[name] refused blood transfusion for the patient as per ED providers  documentation. Hospitalist service was called to evaluate the patient.  Assessment & Plan:   Principal Problem:   L2 vertebral fracture (HCC) Active Problems:   HTN (hypertension)   Dementia (HCC)   Seizure disorder ([place])   Anemia   Fall   CKD (chronic kidney disease), stage III ([place])   AKI (acute kidney injury) ([place])   1 acute L2 compression fracture secondary to mechanical fall CT of the L-spine done on admission showed L2 vertebral body compression fracture with approximately 50% height loss, 2 mm of focal central repulsion without spinal stenosis.  CT head CT C-spine with no acute abnormalities.  Urinalysis negative.  COVID-19 PCR negative.  Continue scheduled Tylenol.  Neurosurgery consulted and recommended lumbar brace, pain management and conservative treatment, PT/OT, outpatient follow-up.   2.  Anemia/low vitamin B12 levels Patient noted to have a hemoglobin of 5.9 on admission..  Last hemoglobin February 2021 was 9.5.  FOBT negative.  Patient noted to be a Jehovah's Witness and refusing blood products at this time and per admitting physician per patient's niece/[name].  TSH within normal limits.  Vitamin B12 of 196.  Anemia panel with iron of 34, TIBC of 4 7, folate of 15.3.  Ferritin level at 32.  Hemoglobin currently at 5.3.  IV fluids have been saline locked.  Patient seen in consultation by hematology, Dr. Marin Olp who reviewed blood smear and noted patient to have significant microcytic and hypochromic red blood cells, normal white cell morphology, no immature myeloid cells, couple of nucleated red blood cells.  Hematology  has started patient on Procrit every other day.  Continue vitamin B12 supplementation, folic acid.  Status post IV iron.  Hematology following and appreciate input and recommendations.   3.  Acute kidney injury on chronic kidney disease stage IIIa Creatinine on presentation was 1.46.  2021 was 1.04.  Below 1.48.  IV fluids have been saline lock.  Lasix on hold.   Follow.   4.  Dementia Stable.  Continue home regimen sertraline, olanzapine.  Palliative care consulted for goals of care.  5.  Hypertension Lasix on hold.  Continue Norvasc 5 mg daily.  Follow.   6.  History of seizures Keppra.      DVT prophylaxis: SCDs Code Status: Full Family Communication: Updated patient.  No family at bedside. Disposition:   Status is: Inpatient    Dispo: The patient is from: Memory care unit/[facility]              Anticipated d/c is to: SNF versus back to memory care unit              Anticipated d/c date is: 07/18/2020              Patient currently with a L2 compression fracture, being evaluated by PT, pain being managed, patient anemic, not stable for discharge.       Consultants:   Neurosurgery: Ferne Reus, [place] 07/12/2020  Hematology: Dr. Marin Olp 07/14/2020  Palliative care: Dr. Domingo Cocking 07/13/2020  Procedures:   CT L-spine 07/12/2020  CT head CT C-spine 07/12/2020  Plain films of the pelvis and right hip 07/12/2020  Antimicrobials:   None   Subjective: Patient sitting up in bed.  Complaining of right thigh pain.  No chest pain.  No shortness of breath.  No abdominal pain.  No dizziness.  No bleeding.   Objective: Vitals:   07/15/20 1433 07/15/20 1753 07/15/20 2152 07/16/20 0519  BP: (!) 176/80 (!) 149/70 (!) 145/67 (!) 165/60  Pulse: 70 80 68 61  Resp: 17 18 16 16   Temp: 98.2 F (36.8 C) 98.7 F (37.1 C) 98.3 F (36.8 C) 98.1 F (36.7 C)  TempSrc: Oral Oral Oral Oral  SpO2: 100% 100% 100% 99%  Weight:      Height:        Intake/Output Summary (Last 24 hours) at 07/16/2020 1233 Last data filed at 07/16/2020 1140 Gross per 24 hour  Intake 370 ml  Output 2950 ml  Net -2580 ml   Filed Weights   07/13/20 0100  Weight: 75.2 kg    Examination:  General exam: NAD.  LSO brace on Respiratory system: CTAB.  No wheezes, no crackles, no rhonchi.  Normal respiratory effort.  Cardiovascular system: Regular rate and  rhythm no murmurs rubs or gallops.  No JVD.  No lower extremity edema.   Gastrointestinal system: Abdomen is soft nondistended, positive bowel sounds.  No rebound.  No guarding.   Central nervous system: Alert and oriented. No focal neurological deficits. Extremities: Symmetric 5 x 5 power. Skin: No rashes, lesions or ulcers Psychiatry: Judgement and insight appear normal. Mood & affect appropriate.     Data Reviewed: I have personally reviewed following labs and imaging studies  CBC: Recent Labs  Lab 07/12/20 1415 07/12/20 1723 07/13/20 0307 07/14/20 0335 07/15/20 0335 07/16/20 0317  WBC 6.7 7.2 8.8 6.1 5.7 6.1  NEUTROABS 4.2 4.6  --   --  3.2  --   HGB 5.9* 5.7* 5.7* 5.3* 5.3* 5.2*  HCT 20.4* 20.0* 20.1* 18.7* 18.1* 17.7*  MCV 82.3 81.6 83.8 84.2 81.2 81.6  PLT 180 205 186 146* 143* 143*    Basic Metabolic Panel: Recent Labs  Lab 07/12/20 1415 07/13/20 0307 07/14/20 0335 07/15/20 0335 07/16/20 0317  NA 143 142 141 142 142  K 4.3 4.8 3.8 3.8 4.2  CL 106 108 109 109 108  CO2 26 24 22 25 26   GLUCOSE 107* 91 82 84 80  BUN 21 20 22 19 23   CREATININE 1.46* 1.48* 1.47* 1.48* 1.42*  CALCIUM 9.7 9.5 9.3 9.2 9.1  MG  --   --  1.9  --   --   PHOS  --   --   --  3.2  --     GFR: Estimated Creatinine Clearance: 23.7 mL/min (A) (by C-G formula based on SCr of 1.42 mg/dL (H)).  Liver Function Tests: Recent Labs  Lab 07/13/20 0307 07/15/20 0335  AST 26  --   ALT 14  --   ALKPHOS 129*  --   BILITOT 0.3  --   PROT 6.2*  --   ALBUMIN 3.9 3.6    CBG: No results for input(s): GLUCAP in the last 168 hours.   Recent Results (from the past 240 hour(s))  Resp Panel by RT-PCR (Flu A&B, Covid) Nasopharyngeal Swab     Status: None   Collection Time: 07/12/20  5:23 PM   Specimen: Nasopharyngeal Swab; Nasopharyngeal(NP) swabs in vial transport medium  Result Value Ref Range Status   SARS Coronavirus 2 by RT PCR NEGATIVE NEGATIVE Final    Comment: (NOTE) SARS-CoV-2  target nucleic acids are NOT DETECTED.  The SARS-CoV-2 RNA is generally detectable in upper respiratory specimens during the acute phase of infection. The lowest concentration of SARS-CoV-2 viral copies this assay can detect is 138 copies/mL. A negative result does not preclude SARS-Cov-2 infection and should not be used as the sole basis for treatment or other patient management decisions. A negative result may occur with  improper specimen collection/handling, submission of specimen other than nasopharyngeal swab, presence of viral mutation(s) within the areas targeted by this assay, and inadequate number of viral copies(<138 copies/mL). A negative result must be combined with clinical observations, patient history, and epidemiological information. The expected result is Negative.  Fact Sheet for Patients:  EntrepreneurPulse.com.au  Fact Sheet for Healthcare Providers:  IncredibleEmployment.be  This test is no t yet approved or cleared by the [place] FDA and  has been authorized for detection and/or diagnosis of SARS-CoV-2 by FDA under an Emergency Use Authorization (EUA). This EUA will remain  in effect (meaning this test can be used) for the duration of the COVID-19 declaration under Section 564(b)(1) of the Act, 21 U.S.C.section 360bbb-3(b)(1), unless the authorization is terminated  or revoked sooner.       Influenza A by PCR NEGATIVE NEGATIVE Final   Influenza B by PCR NEGATIVE NEGATIVE Final    Comment: (NOTE) The Xpert Xpress SARS-CoV-2/FLU/RSV plus assay is intended as an aid in the diagnosis of influenza from Nasopharyngeal swab specimens and should not be used as a sole basis for treatment. Nasal washings and aspirates are unacceptable for Xpert Xpress SARS-CoV-2/FLU/RSV testing.  Fact Sheet for Patients: EntrepreneurPulse.com.au  Fact Sheet for Healthcare  Providers: IncredibleEmployment.be  This test is not yet approved or cleared by the [place] FDA and has been authorized for detection and/or diagnosis of SARS-CoV-2 by FDA under an Emergency Use Authorization (EUA). This EUA will remain in effect (meaning this test can be used) for the  duration of the COVID-19 declaration under Section 564(b)(1) of the Act, 21 U.S.C. section 360bbb-3(b)(1), unless the authorization is terminated or revoked.  Performed at [facility], [place] [address], [place], [place] [address]          Radiology Studies: No results found.      Scheduled Meds: . acetaminophen  500 mg Oral BID  . amLODipine  5 mg Oral Daily  . cyanocobalamin  1,000 mcg Subcutaneous Daily  . epoetin alfa  40,000 Units Subcutaneous Q48H  . feeding supplement  237 mL Oral BID BM  . folic acid  2 mg Oral Daily  . hydrocerin   Topical BID  . levETIRAcetam  500 mg Oral BID  . OLANZapine  2.5 mg Oral QHS  . pantoprazole  40 mg Oral Q0600  . senna-docusate  1 tablet Oral QHS  . sertraline  100 mg Oral Daily   Continuous Infusions: . sodium chloride       LOS: 3 days    Time spent: 35 minutes    Irine Seal, MD [facility]   To contact the attending provider between 7A-7P or the covering provider during after hours 7P-7A, please log into the web site [email or web address] and access using universal [place] password for that web site. If you do not have the password, please call the hospital operator.  07/16/2020, 12:33 PM

## 2020-07-16 NOTE — Plan of Care (Signed)

## 2020-07-16 NOTE — Plan of Care (Signed)
  Problem: Education: Goal: Knowledge of General Education information will improve Description: Including pain rating scale, medication(s)/side effects and non-pharmacologic comfort measures Outcome: Progressing   Problem: Clinical Measurements: Goal: Ability to maintain clinical measurements within normal limits will improve Outcome: Progressing Goal: Will remain free from infection Outcome: Progressing   Problem: Activity: Goal: Risk for activity intolerance will decrease Outcome: Progressing   Problem: Coping: Goal: Level of anxiety will decrease Outcome: Progressing   Problem: Pain Managment: Goal: General experience of comfort will improve Outcome: Progressing   

## 2020-07-17 DIAGNOSIS — D500 Iron deficiency anemia secondary to blood loss (chronic): Secondary | ICD-10-CM | POA: Diagnosis not present

## 2020-07-17 DIAGNOSIS — N1831 Chronic kidney disease, stage 3a: Secondary | ICD-10-CM | POA: Diagnosis not present

## 2020-07-17 DIAGNOSIS — S32020D Wedge compression fracture of second lumbar vertebra, subsequent encounter for fracture with routine healing: Secondary | ICD-10-CM | POA: Diagnosis not present

## 2020-07-17 DIAGNOSIS — G40909 Epilepsy, unspecified, not intractable, without status epilepticus: Secondary | ICD-10-CM | POA: Diagnosis not present

## 2020-07-17 LAB — HEMOGLOBIN AND HEMATOCRIT, BLOOD
HCT: 19.3 % — ABNORMAL LOW (ref 36.0–46.0)
Hemoglobin: 5.4 g/dL — CL (ref 12.0–15.0)

## 2020-07-17 NOTE — Progress Notes (Signed)
PROGRESS NOTE    Shawna Lin  NWG:[id number] DOB: 10/11/1928 DOA: 07/12/2020 PCP: System, Provider Not In    Chief Complaint  Patient presents with  . Fall    Brief Narrative:  HPI per Dr. Truitt Leep is a 87 y.o. female with medical history significant of dementia, hypertension, seizure disorder, prolonged QTC, vitamin D deficiency who presented from the facility after witnessed mechanical fall.  As per ED providers documentation who spoke to the facility staff, patient was seen ambulating without her walker and before staff could get to the patient, she will was seen falling and hitting her head on the door.    She was found to have acute L2 vertebral body compression fracture with approximately 50% height loss and 2 mm of focal central repulsion without spinal stenosis.   ED provider ordered blood transfusion but apparently patient is a Jehovah's Witness and the niece/[name] refused blood transfusion for the patient as per ED providers documentation.   Assessment & Plan:   Principal Problem:   L2 vertebral fracture (HCC) Active Problems:   HTN (hypertension)   Dementia (HCC)   Seizure disorder ([place])   Anemia   Fall   CKD (chronic kidney disease), stage III ([place])   AKI (acute kidney injury) ([place])   1. acute L2 compression fracture secondary to mechanical fall CT of the L-spine done on admission showed L2 vertebral body compression fracture with approximately 50% height loss, 2 mm of focal central repulsion without spinal stenosis.  CT head CT C-spine with no acute abnormalities.  Urinalysis negative.  COVID-19 PCR negative.  Continue scheduled Tylenol.  Neurosurgery consulted and recommended lumbar brace, pain management and conservative treatment, PT/OT, outpatient follow-up.   2.  Anemia/low vitamin B12 levels Patient noted to have a hemoglobin of 5.9 on admission..  Last hemoglobin February 2021 was 9.5.  FOBT negative.  Patient noted to be a Jehovah's Witness and  refusing blood products at this time and per admitting physician per patient's niece/[name].  TSH within normal limits.  Vitamin B12 of 196.  Anemia panel with iron of 34, TIBC of 4 7, folate of 15.3.  Ferritin level at 32.  Hemoglobin currently at 5.3.  IV fluids have been saline locked.  Patient seen in consultation by hematology, Dr. Marin Olp who reviewed blood smear and noted patient to have significant microcytic and hypochromic red blood cells, normal white cell morphology, no immature myeloid cells, couple of nucleated red blood cells.  Hematology has started patient on Procrit every other day for 6 doses.  Continue vitamin B12 supplementation, folic acid.  Status post IV iron.  Hematology: would not expect her blood counts are improving for another week at the earliest. -avoiding blood draws for now  3.  Acute kidney injury on chronic kidney disease stage IIIa Creatinine on presentation was 1.46.  2021 was 1.04.  Below 1.48.  IV fluids have been saline lock.  Lasix on hold.  4.  Dementia Stable.  Continue home regimen sertraline, olanzapine.  Palliative care consulted for goals of care- full treatment sans blood products  5.  Hypertension Lasix on hold.  Continue Norvasc 5 mg daily.  Follow.   6.  History of seizures Keppra.      DVT prophylaxis: SCDs Code Status: Full Disposition:   Status is: Inpatient    Dispo: The patient is from: Memory care unit/[facility]              Anticipated d/c is to: SNF  Anticipated d/c date is: 07/18/2020              Patient currently with a L2 compression fracture, being evaluated by PT, pain being managed, patient anemic, not stable for discharge.       Consultants:   Neurosurgery: Ferne Reus, [place] 07/12/2020  Hematology: Dr. Marin Olp 07/14/2020  Palliative care: Dr. Domingo Cocking 07/13/2020  Procedures:   CT L-spine 07/12/2020  CT head CT C-spine 07/12/2020  Plain films of the pelvis and right hip  07/12/2020  Antimicrobials:   None   Subjective: No complaints  Objective: Vitals:   07/16/20 1329 07/16/20 2200 07/17/20 0550 07/17/20 1350  BP: (!) 132/55 (!) 155/72 (!) 165/69 (!) 145/57  Pulse: (!) 57 67 66 63  Resp: 16 18 18 19   Temp: 98.5 F (36.9 C) 98.4 F (36.9 C) 97.6 F (36.4 C) 97.6 F (36.4 C)  TempSrc:   Oral   SpO2: 94% 99% 100% 100%  Weight:      Height:        Intake/Output Summary (Last 24 hours) at 07/17/2020 1625 Last data filed at 07/17/2020 1536 Gross per 24 hour  Intake 830 ml  Output 1300 ml  Net -470 ml   Filed Weights   07/13/20 0100  Weight: 75.2 kg    Examination:  General exam: in bed, appears comfortable rrr No increased work of breathing Pleasant and cooperative   Data Reviewed: I have personally reviewed following labs and imaging studies  CBC: Recent Labs  Lab 07/12/20 1415 07/12/20 1723 07/13/20 0307 07/14/20 0335 07/15/20 0335 07/16/20 0317 07/17/20 0302  WBC 6.7 7.2 8.8 6.1 5.7 6.1  --   NEUTROABS 4.2 4.6  --   --  3.2  --   --   HGB 5.9* 5.7* 5.7* 5.3* 5.3* 5.2* 5.4*  HCT 20.4* 20.0* 20.1* 18.7* 18.1* 17.7* 19.3*  MCV 82.3 81.6 83.8 84.2 81.2 81.6  --   PLT 180 205 186 146* 143* 143*  --     Basic Metabolic Panel: Recent Labs  Lab 07/12/20 1415 07/13/20 0307 07/14/20 0335 07/15/20 0335 07/16/20 0317  NA 143 142 141 142 142  K 4.3 4.8 3.8 3.8 4.2  CL 106 108 109 109 108  CO2 26 24 22 25 26   GLUCOSE 107* 91 82 84 80  BUN 21 20 22 19 23   CREATININE 1.46* 1.48* 1.47* 1.48* 1.42*  CALCIUM 9.7 9.5 9.3 9.2 9.1  MG  --   --  1.9  --   --   PHOS  --   --   --  3.2  --     GFR: Estimated Creatinine Clearance: 23.7 mL/min (A) (by C-G formula based on SCr of 1.42 mg/dL (H)).  Liver Function Tests: Recent Labs  Lab 07/13/20 0307 07/15/20 0335  AST 26  --   ALT 14  --   ALKPHOS 129*  --   BILITOT 0.3  --   PROT 6.2*  --   ALBUMIN 3.9 3.6    CBG: No results for input(s): GLUCAP in the last 168  hours.   Recent Results (from the past 240 hour(s))  Resp Panel by RT-PCR (Flu A&B, Covid) Nasopharyngeal Swab     Status: None   Collection Time: 07/12/20  5:23 PM   Specimen: Nasopharyngeal Swab; Nasopharyngeal(NP) swabs in vial transport medium  Result Value Ref Range Status   SARS Coronavirus 2 by RT PCR NEGATIVE NEGATIVE Final    Comment: (NOTE) SARS-CoV-2 target nucleic acids are NOT  DETECTED.  The SARS-CoV-2 RNA is generally detectable in upper respiratory specimens during the acute phase of infection. The lowest concentration of SARS-CoV-2 viral copies this assay can detect is 138 copies/mL. A negative result does not preclude SARS-Cov-2 infection and should not be used as the sole basis for treatment or other patient management decisions. A negative result may occur with  improper specimen collection/handling, submission of specimen other than nasopharyngeal swab, presence of viral mutation(s) within the areas targeted by this assay, and inadequate number of viral copies(<138 copies/mL). A negative result must be combined with clinical observations, patient history, and epidemiological information. The expected result is Negative.  Fact Sheet for Patients:  EntrepreneurPulse.com.au  Fact Sheet for Healthcare Providers:  IncredibleEmployment.be  This test is no t yet approved or cleared by the [place] FDA and  has been authorized for detection and/or diagnosis of SARS-CoV-2 by FDA under an Emergency Use Authorization (EUA). This EUA will remain  in effect (meaning this test can be used) for the duration of the COVID-19 declaration under Section 564(b)(1) of the Act, 21 U.S.C.section 360bbb-3(b)(1), unless the authorization is terminated  or revoked sooner.       Influenza A by PCR NEGATIVE NEGATIVE Final   Influenza B by PCR NEGATIVE NEGATIVE Final    Comment: (NOTE) The Xpert Xpress SARS-CoV-2/FLU/RSV plus assay is intended  as an aid in the diagnosis of influenza from Nasopharyngeal swab specimens and should not be used as a sole basis for treatment. Nasal washings and aspirates are unacceptable for Xpert Xpress SARS-CoV-2/FLU/RSV testing.  Fact Sheet for Patients: EntrepreneurPulse.com.au  Fact Sheet for Healthcare Providers: IncredibleEmployment.be  This test is not yet approved or cleared by the [place] FDA and has been authorized for detection and/or diagnosis of SARS-CoV-2 by FDA under an Emergency Use Authorization (EUA). This EUA will remain in effect (meaning this test can be used) for the duration of the COVID-19 declaration under Section 564(b)(1) of the Act, 21 U.S.C. section 360bbb-3(b)(1), unless the authorization is terminated or revoked.  Performed at [facility], [place] [address], [place], [place] [address]          Radiology Studies: No results found.      Scheduled Meds: . acetaminophen  500 mg Oral BID  . amLODipine  5 mg Oral Daily  . cyanocobalamin  1,000 mcg Subcutaneous Daily  . epoetin alfa  40,000 Units Subcutaneous Q48H  . feeding supplement  237 mL Oral BID BM  . folic acid  2 mg Oral Daily  . hydrocerin   Topical BID  . levETIRAcetam  500 mg Oral BID  . OLANZapine  2.5 mg Oral QHS  . pantoprazole  40 mg Oral Q0600  . senna-docusate  1 tablet Oral QHS  . sertraline  100 mg Oral Daily   Continuous Infusions:    LOS: 4 days    Time spent: 25 minutes    Geradine Girt, DO [facility]   To contact the attending provider between 7A-7P or the covering provider during after hours 7P-7A, please log into the web site [email or web address] and access using universal [place] password for that web site. If you do not have the password, please call the hospital operator.  07/17/2020, 4:25 PM

## 2020-07-18 DIAGNOSIS — S32020B Wedge compression fracture of second lumbar vertebra, initial encounter for open fracture: Secondary | ICD-10-CM

## 2020-07-18 DIAGNOSIS — S32020D Wedge compression fracture of second lumbar vertebra, subsequent encounter for fracture with routine healing: Secondary | ICD-10-CM | POA: Diagnosis not present

## 2020-07-18 DIAGNOSIS — N179 Acute kidney failure, unspecified: Secondary | ICD-10-CM | POA: Diagnosis not present

## 2020-07-18 DIAGNOSIS — D649 Anemia, unspecified: Secondary | ICD-10-CM | POA: Diagnosis not present

## 2020-07-18 DIAGNOSIS — G40909 Epilepsy, unspecified, not intractable, without status epilepticus: Secondary | ICD-10-CM | POA: Diagnosis not present

## 2020-07-18 LAB — ERYTHROPOIETIN: Erythropoietin: 593.6 m[IU]/mL — ABNORMAL HIGH (ref 2.6–18.5)

## 2020-07-18 MED ORDER — CLONIDINE HCL 0.1 MG PO TABS
0.2000 mg | ORAL_TABLET | Freq: Every day | ORAL | Status: DC
  Administered 2020-07-18 – 2020-07-23 (×6): 0.2 mg via ORAL
  Filled 2020-07-18 (×6): qty 2

## 2020-07-18 MED ORDER — AMLODIPINE BESYLATE 10 MG PO TABS
10.0000 mg | ORAL_TABLET | Freq: Every day | ORAL | Status: DC
  Administered 2020-07-18 – 2020-07-23 (×6): 10 mg via ORAL
  Filled 2020-07-18 (×6): qty 1

## 2020-07-18 NOTE — Plan of Care (Signed)
  Problem: Pain Managment: Goal: General experience of comfort will improve Outcome: Progressing   

## 2020-07-18 NOTE — Care Management Important Message (Signed)
Important Message  Patient Details IM Letter given to the Patient. Name: Shawna Lin MRN: [id number] Date of Birth: [birth year]   Medicare Important Message Given:  Yes     [name] 07/18/2020, 2:51 PM

## 2020-07-18 NOTE — TOC Progression Note (Signed)
Transition of Care [facility]) - Progression Note    Patient Details  Name: [name] MRN: [id number] Date of Birth: 06/10/1929  Transition of Care [facility]) CM/SW [place], [place] Phone Number: 07/18/2020, 10:31 AM  Clinical Narrative:    CSW reached out to SNF options in [place], left voicemail for admissions  [facility] [phone], left voicemail for the admission  [facility]-No beds available.      Expected Discharge Plan: [place] Barriers to Discharge: Continued Medical Work up  Expected Discharge Plan and Services Expected Discharge Plan: [place] In-house Referral: Clinical Social Work Discharge Planning Services: CM Consult Post Acute Care Choice: [place] arrangements for the past 2 months: [place] (Memory Care)                                       Social Determinants of Health (SDOH) Interventions    Readmission Risk Interventions No flowsheet data found.

## 2020-07-18 NOTE — Progress Notes (Signed)
Progress Note    Shawna Lin  BWI:[id number] DOB: 06/08/1929  DOA: 07/12/2020 PCP: System, Provider Not In      Brief Narrative:    Medical records reviewed and are as summarized below:  Shawna Lin is a 87 y.o. female with medical history significant ofdementia, hypertension, seizure disorder, prolonged QTC,vitamin D deficiency who presented from the facility after witnessed mechanical fall.  Apparently, patient was seen ambulating without her walker.  Unfortunately, she fell before staff could get to her.  She was found to have acute L2 vertebral body compression fracture.  She was seen in consultation by the neurosurgeon who recommended conservative management with LSO brace, pain control and physical therapy.  She also has severe anemia.  However, she refused blood transfusion because she is Jehovah Witness.     Assessment/Plan:   Principal Problem:   L2 vertebral fracture (HCC) Active Problems:   HTN (hypertension)   Dementia (HCC)   Seizure disorder (HCC)   Anemia   Fall   CKD (chronic kidney disease), stage III ([place])   AKI (acute kidney injury) ([place])    Body mass index is 33.48 kg/m.  (Obesity)        1. acute L2 compression fracture secondary to mechanical fall CT of the L-spine done on admission showed L2 vertebral body compression fracture with approximately 50% height loss, 2 mm of focal central repulsion without spinal stenosis.  CT head CT C-spine with no acute abnormalities.  Neurosurgery recommended conservative management with LSO brace, pain control and physical therapy.    2.  Anemia/low vitamin B12 levels Patient noted to have a hemoglobin of 5.9 on admission..  Last hemoglobin February 2021 was 9.5.  FOBT negative.  Patient noted to be a Jehovah's Witness and refusing blood products at this time and per admitting physician per patient's niece/[name].  TSH within normal limits.  Vitamin B12 of 196.  Anemia panel with iron of 34, TIBC  of 4 7, folate of 15.3.  Ferritin level at 32.  Patient seen in consultation by hematology, Dr. Marin Olp who reviewed blood smear and noted patient to have significant microcytic and hypochromic red blood cells, normal white cell morphology, no immature myeloid cells, couple of nucleated red blood cells.  She was started on Procrit and she has also received IV iron infusion.  Continue vitamin B12 supplementation, folic acid.    Per hematologist, blood count may take about a week to improve.   3.  Acute kidney injury on chronic kidney disease stage IIIa Creatinine is trending down.  Creatinine on presentation was 1.46.  2021 was 1.04.  Lasix is on hold.   4.  Dementia Stable.  Continue home regimen sertraline, olanzapine.  Palliative care consulted for goals of care- full treatment sans blood products  5.  Hypertension Lasix on hold.  Continue Norvasc 5 mg daily.   6.  History of seizures Continue Keppra   Awaiting placement to SNF            Diet Order            Diet Heart Room service appropriate? Yes; Fluid consistency: Thin  Diet effective now                    Consultants:  Hematologist  Neurosurgeon  Procedures:  None    Medications:   . acetaminophen  500 mg Oral BID  . amLODipine  10 mg Oral Daily  . cloNIDine  0.2 mg Oral Daily  .  cyanocobalamin  1,000 mcg Subcutaneous Daily  . epoetin alfa  40,000 Units Subcutaneous Q48H  . feeding supplement  237 mL Oral BID BM  . folic acid  2 mg Oral Daily  . hydrocerin   Topical BID  . levETIRAcetam  500 mg Oral BID  . OLANZapine  2.5 mg Oral QHS  . pantoprazole  40 mg Oral Q0600  . senna-docusate  1 tablet Oral QHS  . sertraline  100 mg Oral Daily   Continuous Infusions:   Anti-infectives (From admission, onward)   None             Family Communication/Anticipated D/C date and plan/Code Status   DVT prophylaxis: Place and maintain sequential compression device Start: 07/13/20  1926 SCDs Start: 07/12/20 1730     Code Status: Full Code  Family Communication: None Disposition Plan:    Status is: Inpatient  Remains inpatient appropriate because:Unsafe d/c plan   Dispo: The patient is from: Home              Anticipated d/c is to: SNF              Anticipated d/c date is: 1 day              Patient currently is not medically stable to d/c.           Subjective:   Back pain is not too bad.  No dizziness, palpitations or shortness of breath.  Objective:    Vitals:   07/17/20 1350 07/17/20 2100 07/18/20 0617 07/18/20 1241  BP: (!) 145/57 (!) 135/59 (!) 175/76 (!) 103/44  Pulse: 63 69 63 (!) 52  Resp: 19 18 18 16   Temp: 97.6 F (36.4 C) 99.1 F (37.3 C) 97.7 F (36.5 C) 98.6 F (37 C)  TempSrc:   Oral   SpO2: 100% 100% 100% 100%  Weight:      Height:       No data found.   Intake/Output Summary (Last 24 hours) at 07/18/2020 1452 Last data filed at 07/18/2020 6767 Gross per 24 hour  Intake 490 ml  Output 1350 ml  Net -860 ml   Filed Weights   07/13/20 0100  Weight: 75.2 kg    Exam:  GEN: NAD SKIN: No rash EYES: Pale but anicteric ENT: MMM CV: RRR PULM: CTA B ABD: soft, ND, NT, +BS CNS: AAO x 1 (person), non focal EXT: No edema or tenderness MSK: She's wearing a brace for L2 fracture   Data Reviewed:   I have personally reviewed following labs and imaging studies:  Labs: Labs show the following:   Basic Metabolic Panel: Recent Labs  Lab 07/12/20 1415 07/13/20 0307 07/14/20 0335 07/15/20 0335 07/16/20 0317  NA 143 142 141 142 142  K 4.3 4.8 3.8 3.8 4.2  CL 106 108 109 109 108  CO2 26 24 22 25 26   GLUCOSE 107* 91 82 84 80  BUN 21 20 22 19 23   CREATININE 1.46* 1.48* 1.47* 1.48* 1.42*  CALCIUM 9.7 9.5 9.3 9.2 9.1  MG  --   --  1.9  --   --   PHOS  --   --   --  3.2  --    GFR Estimated Creatinine Clearance: 23.7 mL/min (A) (by C-G formula based on SCr of 1.42 mg/dL (H)). Liver Function Tests: Recent  Labs  Lab 07/13/20 0307 07/15/20 0335  AST 26  --   ALT 14  --   ALKPHOS 129*  --  BILITOT 0.3  --   PROT 6.2*  --   ALBUMIN 3.9 3.6   No results for input(s): LIPASE, AMYLASE in the last 168 hours. No results for input(s): AMMONIA in the last 168 hours. Coagulation profile No results for input(s): INR, PROTIME in the last 168 hours.  CBC: Recent Labs  Lab 07/12/20 1415 07/12/20 1723 07/13/20 0307 07/14/20 0335 07/15/20 0335 07/16/20 0317 07/17/20 0302  WBC 6.7 7.2 8.8 6.1 5.7 6.1  --   NEUTROABS 4.2 4.6  --   --  3.2  --   --   HGB 5.9* 5.7* 5.7* 5.3* 5.3* 5.2* 5.4*  HCT 20.4* 20.0* 20.1* 18.7* 18.1* 17.7* 19.3*  MCV 82.3 81.6 83.8 84.2 81.2 81.6  --   PLT 180 205 186 146* 143* 143*  --    Cardiac Enzymes: No results for input(s): CKTOTAL, CKMB, CKMBINDEX, TROPONINI in the last 168 hours. BNP (last 3 results) No results for input(s): PROBNP in the last 8760 hours. CBG: No results for input(s): GLUCAP in the last 168 hours. D-Dimer: No results for input(s): DDIMER in the last 72 hours. Hgb A1c: No results for input(s): HGBA1C in the last 72 hours. Lipid Profile: No results for input(s): CHOL, HDL, LDLCALC, TRIG, CHOLHDL, LDLDIRECT in the last 72 hours. Thyroid function studies: No results for input(s): TSH, T4TOTAL, T3FREE, THYROIDAB in the last 72 hours.  Invalid input(s): FREET3 Anemia work up: No results for input(s): VITAMINB12, FOLATE, FERRITIN, TIBC, IRON, RETICCTPCT in the last 72 hours. Sepsis Labs: Recent Labs  Lab 07/13/20 0307 07/14/20 0335 07/15/20 0335 07/16/20 0317  WBC 8.8 6.1 5.7 6.1    Microbiology Recent Results (from the past 240 hour(s))  Resp Panel by RT-PCR (Flu A&B, Covid) Nasopharyngeal Swab     Status: None   Collection Time: 07/12/20  5:23 PM   Specimen: Nasopharyngeal Swab; Nasopharyngeal(NP) swabs in vial transport medium  Result Value Ref Range Status   SARS Coronavirus 2 by RT PCR NEGATIVE NEGATIVE Final    Comment:  (NOTE) SARS-CoV-2 target nucleic acids are NOT DETECTED.  The SARS-CoV-2 RNA is generally detectable in upper respiratory specimens during the acute phase of infection. The lowest concentration of SARS-CoV-2 viral copies this assay can detect is 138 copies/mL. A negative result does not preclude SARS-Cov-2 infection and should not be used as the sole basis for treatment or other patient management decisions. A negative result may occur with  improper specimen collection/handling, submission of specimen other than nasopharyngeal swab, presence of viral mutation(s) within the areas targeted by this assay, and inadequate number of viral copies(<138 copies/mL). A negative result must be combined with clinical observations, patient history, and epidemiological information. The expected result is Negative.  Fact Sheet for Patients:  EntrepreneurPulse.com.au  Fact Sheet for Healthcare Providers:  IncredibleEmployment.be  This test is no t yet approved or cleared by the [place] FDA and  has been authorized for detection and/or diagnosis of SARS-CoV-2 by FDA under an Emergency Use Authorization (EUA). This EUA will remain  in effect (meaning this test can be used) for the duration of the COVID-19 declaration under Section 564(b)(1) of the Act, 21 U.S.C.section 360bbb-3(b)(1), unless the authorization is terminated  or revoked sooner.       Influenza A by PCR NEGATIVE NEGATIVE Final   Influenza B by PCR NEGATIVE NEGATIVE Final    Comment: (NOTE) The Xpert Xpress SARS-CoV-2/FLU/RSV plus assay is intended as an aid in the diagnosis of influenza from Nasopharyngeal swab specimens and should  not be used as a sole basis for treatment. Nasal washings and aspirates are unacceptable for Xpert Xpress SARS-CoV-2/FLU/RSV testing.  Fact Sheet for Patients: EntrepreneurPulse.com.au  Fact Sheet for Healthcare  Providers: IncredibleEmployment.be  This test is not yet approved or cleared by the [place] FDA and has been authorized for detection and/or diagnosis of SARS-CoV-2 by FDA under an Emergency Use Authorization (EUA). This EUA will remain in effect (meaning this test can be used) for the duration of the COVID-19 declaration under Section 564(b)(1) of the Act, 21 U.S.C. section 360bbb-3(b)(1), unless the authorization is terminated or revoked.  Performed at [facility], [place] [address], [place], [place] [address]     Procedures and diagnostic studies:  No results found.             LOS: 5 days   Jeffory Snelgrove  [facility]   Pager on [email or web address]. If 7PM-7AM, please contact night-coverage at [email or web address]     07/18/2020, 2:52 PM

## 2020-07-18 NOTE — Progress Notes (Signed)
Overall, Shawna Lin is about the same.  Her blood pressure is going up.  Regarding have to be a bit more aggressive with respect to her blood pressure control.  The amlodipine can be increased up to 10 mg a day.  Her hemoglobin is 5.4.  She is on the Procrit every other day.  She is getting folic acid and vitamin B-12.  There is been no obvious bleeding.  Her blood pressure this morning is 175/76.  I probably would give her some clonidine to help bring that down a little bit.  Her appetite is okay.  Hard to say if there is any kind of pain from the fall and the L2 fracture.  Again, I would think that this week, her hemoglobin will start going up.  I know that she is getting outstanding care from all staff up on 3 [address].  Lattie Haw, MD  Psalm 31:24

## 2020-07-19 DIAGNOSIS — S32020D Wedge compression fracture of second lumbar vertebra, subsequent encounter for fracture with routine healing: Secondary | ICD-10-CM | POA: Diagnosis not present

## 2020-07-19 DIAGNOSIS — D649 Anemia, unspecified: Secondary | ICD-10-CM | POA: Diagnosis not present

## 2020-07-19 DIAGNOSIS — N1831 Chronic kidney disease, stage 3a: Secondary | ICD-10-CM | POA: Diagnosis not present

## 2020-07-19 DIAGNOSIS — N179 Acute kidney failure, unspecified: Secondary | ICD-10-CM | POA: Diagnosis not present

## 2020-07-19 LAB — CBC WITH DIFFERENTIAL/PLATELET
Abs Immature Granulocytes: 0.13 10*3/uL — ABNORMAL HIGH (ref 0.00–0.07)
Basophils Absolute: 0.1 10*3/uL (ref 0.0–0.1)
Basophils Relative: 1 %
Eosinophils Absolute: 0.3 10*3/uL (ref 0.0–0.5)
Eosinophils Relative: 4 %
HCT: 23.3 % — ABNORMAL LOW (ref 36.0–46.0)
Hemoglobin: 6.6 g/dL — CL (ref 12.0–15.0)
Immature Granulocytes: 2 %
Lymphocytes Relative: 24 %
Lymphs Abs: 1.5 10*3/uL (ref 0.7–4.0)
MCH: 24 pg — ABNORMAL LOW (ref 26.0–34.0)
MCHC: 28.3 g/dL — ABNORMAL LOW (ref 30.0–36.0)
MCV: 84.7 fL (ref 80.0–100.0)
Monocytes Absolute: 0.9 10*3/uL (ref 0.1–1.0)
Monocytes Relative: 15 %
Neutro Abs: 3.5 10*3/uL (ref 1.7–7.7)
Neutrophils Relative %: 54 %
Platelets: 184 10*3/uL (ref 150–400)
RBC: 2.75 MIL/uL — ABNORMAL LOW (ref 3.87–5.11)
RDW: 23.3 % — ABNORMAL HIGH (ref 11.5–15.5)
WBC: 6.4 10*3/uL (ref 4.0–10.5)
nRBC: 6.3 % — ABNORMAL HIGH (ref 0.0–0.2)

## 2020-07-19 LAB — RETICULOCYTES
Immature Retic Fract: 44 % — ABNORMAL HIGH (ref 2.3–15.9)
RBC.: 2.67 MIL/uL — ABNORMAL LOW (ref 3.87–5.11)
Retic Count, Absolute: 96.9 10*3/uL (ref 19.0–186.0)
Retic Ct Pct: 3.6 % — ABNORMAL HIGH (ref 0.4–3.1)

## 2020-07-19 MED ORDER — LORAZEPAM 2 MG/ML IJ SOLN: 0.5000 mg | Freq: Four times a day (QID) | INTRAMUSCULAR | Status: DC | PRN

## 2020-07-19 NOTE — TOC Progression Note (Signed)
Transition of Care [facility]) - Progression Note    Patient Details  Name: Shawna Lin MRN: [id number] Date of Birth: 11/04/1928  Transition of Care [facility] CM/SW [place], [place] Phone Number: 07/19/2020, 10:47 AM  Clinical Narrative:    SNF [place] options unable to accept the patient at this time due to facility at capacity and covid protocols. Niece Shawna Lin requested patient information be faxed to [facility] for review.  CSW notified Niece Shawna Lin the patient may be ready to d/c soon. Shawna Lin agreeable to a facility in [place] for temporary placement until she can find a permanent location for the patient in the [place] area. Niece Shawna Lin agreeable to SNF [facility]. Facility admission staff made aware.   Patient will need a updated covid test.     Expected Discharge Plan: Skilled Nursing Facility Barriers to Discharge: Continued Medical Work up  Expected Discharge Plan and Services Expected Discharge Plan: [place] In-house Referral: Clinical Social Work Discharge Planning Services: CM Consult Post Acute Care Choice: [place] Living arrangements for the past 2 months: [place] (Memory Care)                                       Social Determinants of Health (SDOH) Interventions    Readmission Risk Interventions No flowsheet data found.

## 2020-07-19 NOTE — Progress Notes (Signed)
CRITICAL VALUE ALERT  Critical Value:  Hemoglobin 6.6  Date & Time Notied:  07/19/2020, 6712  Provider Notified: Dr. Kathie Dike, MD  Orders Received/Actions taken: N/A   S[place]ittemore, RN

## 2020-07-19 NOTE — Plan of Care (Signed)
  Problem: Activity: Goal: Risk for activity intolerance will decrease Outcome: Progressing   Problem: Pain Managment: Goal: General experience of comfort will improve Outcome: Progressing   

## 2020-07-19 NOTE — Progress Notes (Signed)
Physical Therapy Treatment Patient Details Name: Shawna Lin MRN: [id number] DOB: [birth year] Today's Date: 07/19/2020    History of Present Illness 87 y.o. female with medical history significant of dementia, hypertension, seizure disorder, prolonged QTC, vitamin D deficiency who presented from the facility after witnessed mechanical fall.  Pt admitted for acute L2 compression fracture secondary to possible mechanical fall and anemia    PT Comments    Pt up in recliner. Min assist for sit to stand, pt stood for ~40 seconds and performed marching in standing with RW. Pt declined further standing and ambulation because the floor was too cold for her feet, no shoes were available in her room. Pt agreed to seated BUE/LE exercises.    Follow Up Recommendations  SNF     Equipment Recommendations  None recommended by PT    Recommendations for Other Services       Precautions / Restrictions Precautions Precautions: Fall Required Braces or Orthoses: Spinal Brace Spinal Brace: Applied in sitting position;Lumbar corset Restrictions Weight Bearing Restrictions: No    Mobility  Bed Mobility               General bed mobility comments: up in recliner  Transfers Overall transfer level: Needs assistance Equipment used: Rolling walker (2 wheeled) Transfers: Sit to/from Stand Sit to Stand: Min assist         General transfer comment: multimodal cues for technique, pt reliant on UE support, min A to power up, pt stood for ~40 seconds and marched in place with RW, she declined further standing and ambulation bc the floor was too cold on her feet  Ambulation/Gait                 Stairs             Wheelchair Mobility    Modified Rankin (Stroke Patients Only)       Balance Overall balance assessment: History of Falls;Needs assistance Sitting-balance support: No upper extremity supported;Feet supported Sitting balance-Leahy Scale: Fair     Standing balance  support: Bilateral upper extremity supported Standing balance-Leahy Scale: Poor Standing balance comment: reliant on UE support                            Cognition Arousal/Alertness: Awake/alert Behavior During Therapy: WFL for tasks assessed/performed Overall Cognitive Status: No family/caregiver present to determine baseline cognitive functioning Area of Impairment: Orientation;Safety/judgement                 Orientation Level: Disoriented to;Time;Situation       Safety/Judgement: Decreased awareness of safety;Decreased awareness of deficits     General Comments: hx dementia, pt following simple commands, pt resistant to standing more than 1x bc the floor was cold and her shoes were not in the room for her to wear, she agreed to seated exercises      Exercises Total Joint Exercises Marching in Standing: AROM;Both;10 reps;Standing General Exercises - Lower Extremity Ankle Circles/Pumps: AROM;Both;10 reps;Seated Long Arc Quad: AROM;Both;10 reps;Seated Hip Flexion/Marching: AROM;Both;10 reps;Seated  Shoulder flexion AROM both x 10 reps, seated    General Comments        Pertinent Vitals/Pain Faces Pain Scale: No hurt    Home Living                      Prior Function            PT Goals (current goals can now be  found in the care plan section) Acute Rehab PT Goals Patient Stated Goal: to eat lunch PT Goal Formulation: With patient Time For Goal Achievement: 07/27/20 Potential to Achieve Goals: Good Progress towards PT goals: Progressing toward goals    Frequency    Min 3X/week      PT Plan Current plan remains appropriate    Co-evaluation              AM-PAC PT "6 Clicks" Mobility   Outcome Measure  Help needed turning from your back to your side while in a flat bed without using bedrails?: A Little Help needed moving from lying on your back to sitting on the side of a flat bed without using bedrails?: A Little Help  needed moving to and from a bed to a chair (including a wheelchair)?: A Little Help needed standing up from a chair using your arms (e.g., wheelchair or bedside chair)?: A Little Help needed to walk in hospital room?: A Lot Help needed climbing 3-5 steps with a railing? : A Lot 6 Click Score: 16    End of Session Equipment Utilized During Treatment: Gait belt;Back brace Activity Tolerance: Patient tolerated treatment well;No increased pain Patient left: in chair;with call bell/phone within reach;with chair alarm set Nurse Communication: Mobility status PT Visit Diagnosis: Difficulty in walking, not elsewhere classified (R26.2);Muscle weakness (generalized) (M62.81)     Time: 5374-8270 PT Time Calculation (min) (ACUTE ONLY): 12 min  Charges:  $Therapeutic Activity: 8-22 mins                     Blondell Reveal Kistler PT 07/19/2020  Acute Rehabilitation Services Pager [phone] Office [phone]

## 2020-07-19 NOTE — Progress Notes (Signed)
Progress Note    Shawna Lin  YFV:[id number] DOB: 28-Nov-1928  DOA: 07/12/2020 PCP: System, Provider Not In      Brief Narrative:    Medical records reviewed and are as summarized below:  Shawna Lin is a 87 y.o. female with medical history significant ofdementia, hypertension, seizure disorder, prolonged QTC,vitamin D deficiency who presented from the facility after witnessed mechanical fall.  Apparently, patient was seen ambulating without her walker.  Unfortunately, she fell before staff could get to her.  She was found to have acute L2 vertebral body compression fracture.  She was seen in consultation by the neurosurgeon who recommended conservative management with LSO brace, pain control and physical therapy.  She also has severe anemia.  However, she refused blood transfusion because she is Jehovah Witness.     Assessment/Plan:   Principal Problem:   L2 vertebral fracture (HCC) Active Problems:   HTN (hypertension)   Dementia (HCC)   Seizure disorder (HCC)   Anemia   Fall   CKD (chronic kidney disease), stage III ([place])   AKI (acute kidney injury) ([place])    Body mass index is 33.48 kg/m.  (Obesity)    1. acute L2 compression fracture secondary to mechanical fall CT of the L-spine done on admission showed L2 vertebral body compression fracture with approximately 50% height loss, 2 mm of focal central repulsion without spinal stenosis.  CT head CT C-spine with no acute abnormalities.  Neurosurgery recommended conservative management with LSO brace, pain control and physical therapy.    2.  Anemia/low vitamin B12 levels Patient noted to have a hemoglobin of 5.9 on admission..  Last hemoglobin February 2021 was 9.5.  FOBT negative.  Patient noted to be a Jehovah's Witness and refusing blood products at this time and per admitting physician per patient's niece/[name].  TSH within normal limits.  Vitamin B12 of 196.  Anemia panel with iron of 34, TIBC of 4 7,  folate of 15.3.  Ferritin level at 32.  Patient seen in consultation by hematology, Dr. Marin Olp who reviewed blood smear and noted patient to have significant microcytic and hypochromic red blood cells, normal white cell morphology, no immature myeloid cells, couple of nucleated red blood cells.  She was started on Procrit and she has also received IV iron infusion.  Continue vitamin B12 supplementation, folic acid.    Per hematologist, blood count may take about a week to improve. Follow up hemoglobin improving to 6.6 on 1/4   3.  Acute kidney injury on chronic kidney disease stage IIIa Suspect related to severe anemia. Creatinine is trending down.  Creatinine on presentation was 1.46.  2021 was 1.04.  Lasix is on hold.   4.  Dementia Stable.  Continue home regimen sertraline, olanzapine.  Palliative care consulted for goals of care- full treatment sans blood products  5.  Hypertension Lasix on hold.  Continue Norvasc 5 mg daily.   6.  History of seizures Continue Keppra   Awaiting insurance authorization for [facility]. She is medically stable for discharge.     Diet Order            Diet Heart Room service appropriate? Yes; Fluid consistency: Thin  Diet effective now                    Consultants:  Hematologist  Neurosurgeon  Procedures:  None    Medications:   . acetaminophen  500 mg Oral BID  . amLODipine  10  mg Oral Daily  . cloNIDine  0.2 mg Oral Daily  . cyanocobalamin  1,000 mcg Subcutaneous Daily  . epoetin alfa  40,000 Units Subcutaneous Q48H  . feeding supplement  237 mL Oral BID BM  . folic acid  2 mg Oral Daily  . hydrocerin   Topical BID  . levETIRAcetam  500 mg Oral BID  . OLANZapine  2.5 mg Oral QHS  . pantoprazole  40 mg Oral Q0600  . senna-docusate  1 tablet Oral QHS  . sertraline  100 mg Oral Daily   Continuous Infusions:   Anti-infectives (From admission, onward)   None             Family  Communication/Anticipated D/C date and plan/Code Status   DVT prophylaxis: Place and maintain sequential compression device Start: 07/13/20 1926 SCDs Start: 07/12/20 1730     Code Status: Full Code  Family Communication: no family present Disposition Plan:    Status is: Inpatient  Remains inpatient appropriate because:Unsafe d/c plan   Dispo: The patient is from: Home              Anticipated d/c is to: SNF              Anticipated d/c date is: 1 day              Patient currently is medically stable to d/c.           Subjective:   Denies any back pain. No shortness of breath.  Objective:    Vitals:   07/17/20 2100 07/18/20 0617 07/18/20 1241 07/19/20 0126  BP: (!) 135/59 (!) 175/76 (!) 103/44 (!) 151/70  Pulse: 69 63 (!) 52 (!) 54  Resp: 18 18 16 17   Temp: 99.1 F (37.3 C) 97.7 F (36.5 C) 98.6 F (37 C) (!) 97.5 F (36.4 C)  TempSrc:  Oral  Oral  SpO2: 100% 100% 100% 100%  Weight:      Height:       No data found.   Intake/Output Summary (Last 24 hours) at 07/19/2020 1455 Last data filed at 07/19/2020 0928 Gross per 24 hour  Intake 440 ml  Output 600 ml  Net -160 ml   Filed Weights   07/13/20 0100  Weight: 75.2 kg    Exam:  General exam: Alert, awake, oriented x 2 (self and place) Respiratory system: Clear to auscultation. Respiratory effort normal. Cardiovascular system:RRR. No murmurs, rubs, gallops. Gastrointestinal system: Abdomen is nondistended, soft and nontender. No organomegaly or masses felt. Normal bowel sounds heard. Central nervous system: Alert and oriented. No focal neurological deficits. Extremities: No C/C/E, +pedal pulses Skin: No rashes, lesions or ulcers Psychiatry: pleasantly confused    Data Reviewed:   I have personally reviewed following labs and imaging studies:  Labs: Labs show the following:   Basic Metabolic Panel: Recent Labs  Lab 07/13/20 0307 07/14/20 0335 07/15/20 0335 07/16/20 0317  NA 142  141 142 142  K 4.8 3.8 3.8 4.2  CL 108 109 109 108  CO2 24 22 25 26   GLUCOSE 91 82 84 80  BUN 20 22 19 23   CREATININE 1.48* 1.47* 1.48* 1.42*  CALCIUM 9.5 9.3 9.2 9.1  MG  --  1.9  --   --   PHOS  --   --  3.2  --    GFR Estimated Creatinine Clearance: 23.7 mL/min (A) (by C-G formula based on SCr of 1.42 mg/dL (H)). Liver Function Tests: Recent Labs  Lab 07/13/20  2952 07/15/20 0335  AST 26  --   ALT 14  --   ALKPHOS 129*  --   BILITOT 0.3  --   PROT 6.2*  --   ALBUMIN 3.9 3.6   No results for input(s): LIPASE, AMYLASE in the last 168 hours. No results for input(s): AMMONIA in the last 168 hours. Coagulation profile No results for input(s): INR, PROTIME in the last 168 hours.  CBC: Recent Labs  Lab 07/12/20 1723 07/13/20 0307 07/14/20 0335 07/15/20 0335 07/16/20 0317 07/17/20 0302 07/19/20 0756  WBC 7.2 8.8 6.1 5.7 6.1  --  6.4  NEUTROABS 4.6  --   --  3.2  --   --  3.5  HGB 5.7* 5.7* 5.3* 5.3* 5.2* 5.4* 6.6*  HCT 20.0* 20.1* 18.7* 18.1* 17.7* 19.3* 23.3*  MCV 81.6 83.8 84.2 81.2 81.6  --  84.7  PLT 205 186 146* 143* 143*  --  184   Cardiac Enzymes: No results for input(s): CKTOTAL, CKMB, CKMBINDEX, TROPONINI in the last 168 hours. BNP (last 3 results) No results for input(s): PROBNP in the last 8760 hours. CBG: No results for input(s): GLUCAP in the last 168 hours. D-Dimer: No results for input(s): DDIMER in the last 72 hours. Hgb A1c: No results for input(s): HGBA1C in the last 72 hours. Lipid Profile: No results for input(s): CHOL, HDL, LDLCALC, TRIG, CHOLHDL, LDLDIRECT in the last 72 hours. Thyroid function studies: No results for input(s): TSH, T4TOTAL, T3FREE, THYROIDAB in the last 72 hours.  Invalid input(s): FREET3 Anemia work up: Recent Labs    07/19/20 0756  RETICCTPCT 3.6*   Sepsis Labs: Recent Labs  Lab 07/14/20 0335 07/15/20 0335 07/16/20 0317 07/19/20 0756  WBC 6.1 5.7 6.1 6.4    Microbiology Recent Results (from the past  240 hour(s))  Resp Panel by RT-PCR (Flu A&B, Covid) Nasopharyngeal Swab     Status: None   Collection Time: 07/12/20  5:23 PM   Specimen: Nasopharyngeal Swab; Nasopharyngeal(NP) swabs in vial transport medium  Result Value Ref Range Status   SARS Coronavirus 2 by RT PCR NEGATIVE NEGATIVE Final    Comment: (NOTE) SARS-CoV-2 target nucleic acids are NOT DETECTED.  The SARS-CoV-2 RNA is generally detectable in upper respiratory specimens during the acute phase of infection. The lowest concentration of SARS-CoV-2 viral copies this assay can detect is 138 copies/mL. A negative result does not preclude SARS-Cov-2 infection and should not be used as the sole basis for treatment or other patient management decisions. A negative result may occur with  improper specimen collection/handling, submission of specimen other than nasopharyngeal swab, presence of viral mutation(s) within the areas targeted by this assay, and inadequate number of viral copies(<138 copies/mL). A negative result must be combined with clinical observations, patient history, and epidemiological information. The expected result is Negative.  Fact Sheet for Patients:  EntrepreneurPulse.com.au  Fact Sheet for Healthcare Providers:  IncredibleEmployment.be  This test is no t yet approved or cleared by the [place] FDA and  has been authorized for detection and/or diagnosis of SARS-CoV-2 by FDA under an Emergency Use Authorization (EUA). This EUA will remain  in effect (meaning this test can be used) for the duration of the COVID-19 declaration under Section 564(b)(1) of the Act, 21 U.S.C.section 360bbb-3(b)(1), unless the authorization is terminated  or revoked sooner.       Influenza A by PCR NEGATIVE NEGATIVE Final   Influenza B by PCR NEGATIVE NEGATIVE Final    Comment: (NOTE) The Xpert Xpress  SARS-CoV-2/FLU/RSV plus assay is intended as an aid in the diagnosis of influenza  from Nasopharyngeal swab specimens and should not be used as a sole basis for treatment. Nasal washings and aspirates are unacceptable for Xpert Xpress SARS-CoV-2/FLU/RSV testing.  Fact Sheet for Patients: EntrepreneurPulse.com.au  Fact Sheet for Healthcare Providers: IncredibleEmployment.be  This test is not yet approved or cleared by the [place] FDA and has been authorized for detection and/or diagnosis of SARS-CoV-2 by FDA under an Emergency Use Authorization (EUA). This EUA will remain in effect (meaning this test can be used) for the duration of the COVID-19 declaration under Section 564(b)(1) of the Act, 21 U.S.C. section 360bbb-3(b)(1), unless the authorization is terminated or revoked.  Performed at [facility], [place] [address] [place], [place] [address]     Procedures and diagnostic studies:  No results found.             LOS: 6 days   [employer] on [email or web address]. If 7PM-7AM, please contact night-coverage at [email or web address]     07/19/2020, 2:55 PM

## 2020-07-20 DIAGNOSIS — S32020B Wedge compression fracture of second lumbar vertebra, initial encounter for open fracture: Secondary | ICD-10-CM | POA: Diagnosis not present

## 2020-07-20 LAB — RETICULOCYTES
Immature Retic Fract: 38.8 % — ABNORMAL HIGH (ref 2.3–15.9)
RBC.: 2.45 MIL/uL — ABNORMAL LOW (ref 3.87–5.11)
Retic Count, Absolute: 98.7 10*3/uL (ref 19.0–186.0)
Retic Ct Pct: 4 % — ABNORMAL HIGH (ref 0.4–3.1)

## 2020-07-20 LAB — CBC WITH DIFFERENTIAL/PLATELET
Abs Immature Granulocytes: 0.15 10*3/uL — ABNORMAL HIGH (ref 0.00–0.07)
Basophils Absolute: 0 10*3/uL (ref 0.0–0.1)
Basophils Relative: 1 %
Eosinophils Absolute: 0.2 10*3/uL (ref 0.0–0.5)
Eosinophils Relative: 4 %
HCT: 20.9 % — ABNORMAL LOW (ref 36.0–46.0)
Hemoglobin: 5.9 g/dL — CL (ref 12.0–15.0)
Immature Granulocytes: 2 %
Lymphocytes Relative: 26 %
Lymphs Abs: 1.7 10*3/uL (ref 0.7–4.0)
MCH: 24.1 pg — ABNORMAL LOW (ref 26.0–34.0)
MCHC: 28.2 g/dL — ABNORMAL LOW (ref 30.0–36.0)
MCV: 85.3 fL (ref 80.0–100.0)
Monocytes Absolute: 0.9 10*3/uL (ref 0.1–1.0)
Monocytes Relative: 14 %
Neutro Abs: 3.4 10*3/uL (ref 1.7–7.7)
Neutrophils Relative %: 53 %
Platelets: 164 10*3/uL (ref 150–400)
RBC: 2.45 MIL/uL — ABNORMAL LOW (ref 3.87–5.11)
RDW: 23.7 % — ABNORMAL HIGH (ref 11.5–15.5)
WBC: 6.3 10*3/uL (ref 4.0–10.5)
nRBC: 9.9 % — ABNORMAL HIGH (ref 0.0–0.2)

## 2020-07-20 LAB — SARS CORONAVIRUS 2 (TAT 6-24 HRS): SARS Coronavirus 2: NEGATIVE

## 2020-07-20 MED ORDER — SODIUM CHLORIDE 0.9 % IV SOLN
INTRAVENOUS | Status: DC | PRN
  Administered 2020-07-20: 250 mL via INTRAVENOUS

## 2020-07-20 MED ORDER — SODIUM CHLORIDE 0.9 % IV SOLN
510.0000 mg | Freq: Once | INTRAVENOUS | Status: AC
  Administered 2020-07-20: 510 mg via INTRAVENOUS
  Filled 2020-07-20: qty 510

## 2020-07-20 NOTE — Progress Notes (Signed)
Progress Note    Shawna Lin  WUJ:[id number] DOB: Aug 03, 1928  DOA: 07/12/2020 PCP: System, Provider Not In      Brief Narrative:    Medical records reviewed and are as summarized below:  Shawna Lin is a 87 y.o. female with medical history significant ofdementia, hypertension, seizure disorder, prolonged QTC,vitamin D deficiency who presented from the facility after witnessed mechanical fall.  Apparently, patient was seen ambulating without her walker.  Unfortunately, she fell before staff could get to her.  She was found to have acute L2 vertebral body compression fracture.  She was seen in consultation by the neurosurgeon who recommended conservative management with LSO brace, pain control and physical therapy.  She also has severe anemia.  However, she refused blood transfusion because she is Jehovah Witness.     Assessment/Plan:   Principal Problem:   L2 vertebral fracture (HCC) Active Problems:   HTN (hypertension)   Dementia (HCC)   Seizure disorder (HCC)   Anemia   Fall   CKD (chronic kidney disease), stage III ([place])   AKI (acute kidney injury) ([place])    Body mass index is 33.48 kg/m.  (Obesity)    1. acute L2 compression fracture secondary to mechanical fall CT of the L-spine done on admission showed L2 vertebral body compression fracture with approximately 50% height loss, 2 mm of focal central repulsion without spinal stenosis.  CT head CT C-spine with no acute abnormalities.  Neurosurgery recommended conservative management with LSO brace, pain control and physical therapy.    2.  Anemia/low vitamin B12 levels Patient noted to have a hemoglobin of 5.9 on admission..  Last hemoglobin February 2021 was 9.5.  FOBT negative.  Patient noted to be a Jehovah's Witness and refusing blood products at this time and per admitting physician per patient's niece/[name].  TSH within normal limits.  Vitamin B12 of 196.  Anemia panel with iron of 34, TIBC of 4 7,  folate of 15.3.  Ferritin level at 32.  Patient seen in consultation by hematology, Dr. Marin Olp who reviewed blood smear and noted patient to have significant microcytic and hypochromic red blood cells, normal white cell morphology, no immature myeloid cells, couple of nucleated red blood cells.  She was started on Procrit and she has also received IV iron infusion.  Continue vitamin B12 supplementation, folic acid.    Per hematologist, blood count may take about a week to improve. She is getting IV iron today, she is also getting EPO injection every 48 hours, not able to discharge her due to nursing home not able to do EPO injection every 48 hours   3.  Acute kidney injury on chronic kidney disease stage IIIa Suspect related to severe anemia. Creatinine is trending down.  Creatinine on presentation was 1.46.  2021 was 1.04.  Lasix is on hold.   4.  Dementia Stable.  Continue home regimen sertraline, olanzapine.   Discussed with niece [name] over the phone , she reported no aggressive procedures desired due to patient advanced age and progressive dementia , no EGD or colonoscopy for anemia work-up  3 agree with subcu injection of Procrit and IV iron , she agreed to talk to palliative care    5.  Hypertension Lasix on hold.  Continue Norvasc 5 mg daily.   6.  History of seizures Continue [facility] facility will not accept patient due to she is getting EPO injection every 48 hours     Diet Order  Diet Heart Room service appropriate? Yes; Fluid consistency: Thin  Diet effective now                    Consultants:  Hematologist  Neurosurgeon  Procedures:  None    Medications:   . acetaminophen  500 mg Oral BID  . amLODipine  10 mg Oral Daily  . cloNIDine  0.2 mg Oral Daily  . cyanocobalamin  1,000 mcg Subcutaneous Daily  . epoetin alfa  40,000 Units Subcutaneous Q48H  . feeding supplement  237 mL Oral BID BM  . folic acid  2 mg Oral  Daily  . hydrocerin   Topical BID  . levETIRAcetam  500 mg Oral BID  . OLANZapine  2.5 mg Oral QHS  . pantoprazole  40 mg Oral Q0600  . senna-docusate  1 tablet Oral QHS  . sertraline  100 mg Oral Daily   Continuous Infusions: . sodium chloride Stopped (07/20/20 1300)     Anti-infectives (From admission, onward)   None             Family Communication/Anticipated D/C date and plan/Code Status   DVT prophylaxis: Place and maintain sequential compression device Start: 07/13/20 1926 SCDs Start: 07/12/20 1730     Code Status: Full Code  Family Communication: Niece over the phone Disposition Plan:    Status is: Inpatient  Remains inpatient appropriate because:Unsafe d/c plan   Dispo: The patient is from: Home              Anticipated d/c is to: SNF              Anticipated d/c date is: TBD           Subjective:   Denies any back pain. No shortness of breath.  Pleasantly demented  Objective:    Vitals:   07/19/20 0126 07/19/20 2135 07/20/20 0517 07/20/20 1348  BP: (!) 151/70 (!) 100/48 (!) 123/51 (!) 103/54  Pulse: (!) 54 (!) 51 (!) 57 (!) 49  Resp: 17 16 16 18   Temp: (!) 97.5 F (36.4 C) 98.2 F (36.8 C) 98.1 F (36.7 C) 97.6 F (36.4 C)  TempSrc: Oral Oral Oral Oral  SpO2: 100% 100% 100% 100%  Weight:      Height:       No data found.   Intake/Output Summary (Last 24 hours) at 07/20/2020 1913 Last data filed at 07/20/2020 1850 Gross per 24 hour  Intake 497 ml  Output 50 ml  Net 447 ml   Filed Weights   07/13/20 0100  Weight: 75.2 kg    Exam:  General exam: Alert, awake, oriented x 2 (self and place) Respiratory system: Clear to auscultation. Respiratory effort normal. Cardiovascular system:RRR. No murmurs, rubs, gallops. Gastrointestinal system: Abdomen is nondistended, soft and nontender.  Normal bowel sounds heard. Central nervous system: Alert . No focal neurological deficits. Extremities: No C/C/E, +pedal pulses Skin: No  rashes, lesions or ulcers Psychiatry: pleasantly confused    Data Reviewed:   I have personally reviewed following labs and imaging studies:  Labs: Labs show the following:   Basic Metabolic Panel: Recent Labs  Lab 07/14/20 0335 07/15/20 0335 07/16/20 0317  NA 141 142 142  K 3.8 3.8 4.2  CL 109 109 108  CO2 22 25 26   GLUCOSE 82 84 80  BUN 22 19 23   CREATININE 1.47* 1.48* 1.42*  CALCIUM 9.3 9.2 9.1  MG 1.9  --   --   PHOS  --  3.2  --    GFR Estimated Creatinine Clearance: 23.7 mL/min (A) (by C-G formula based on SCr of 1.42 mg/dL (H)). Liver Function Tests: Recent Labs  Lab 07/15/20 0335  ALBUMIN 3.6   No results for input(s): LIPASE, AMYLASE in the last 168 hours. No results for input(s): AMMONIA in the last 168 hours. Coagulation profile No results for input(s): INR, PROTIME in the last 168 hours.  CBC: Recent Labs  Lab 07/14/20 0335 07/15/20 0335 07/16/20 0317 07/17/20 0302 07/19/20 0756 07/20/20 0449  WBC 6.1 5.7 6.1  --  6.4 6.3  NEUTROABS  --  3.2  --   --  3.5 3.4  HGB 5.3* 5.3* 5.2* 5.4* 6.6* 5.9*  HCT 18.7* 18.1* 17.7* 19.3* 23.3* 20.9*  MCV 84.2 81.2 81.6  --  84.7 85.3  PLT 146* 143* 143*  --  184 164   Cardiac Enzymes: No results for input(s): CKTOTAL, CKMB, CKMBINDEX, TROPONINI in the last 168 hours. BNP (last 3 results) No results for input(s): PROBNP in the last 8760 hours. CBG: No results for input(s): GLUCAP in the last 168 hours. D-Dimer: No results for input(s): DDIMER in the last 72 hours. Hgb A1c: No results for input(s): HGBA1C in the last 72 hours. Lipid Profile: No results for input(s): CHOL, HDL, LDLCALC, TRIG, CHOLHDL, LDLDIRECT in the last 72 hours. Thyroid function studies: No results for input(s): TSH, T4TOTAL, T3FREE, THYROIDAB in the last 72 hours.  Invalid input(s): FREET3 Anemia work up: Recent Labs    07/19/20 0756 07/20/20 0449  RETICCTPCT 3.6* 4.0*   Sepsis Labs: Recent Labs  Lab 07/15/20 0335  07/16/20 0317 07/19/20 0756 07/20/20 0449  WBC 5.7 6.1 6.4 6.3    Microbiology Recent Results (from the past 240 hour(s))  Resp Panel by RT-PCR (Flu A&B, Covid) Nasopharyngeal Swab     Status: None   Collection Time: 07/12/20  5:23 PM   Specimen: Nasopharyngeal Swab; Nasopharyngeal(NP) swabs in vial transport medium  Result Value Ref Range Status   SARS Coronavirus 2 by RT PCR NEGATIVE NEGATIVE Final    Comment: (NOTE) SARS-CoV-2 target nucleic acids are NOT DETECTED.  The SARS-CoV-2 RNA is generally detectable in upper respiratory specimens during the acute phase of infection. The lowest concentration of SARS-CoV-2 viral copies this assay can detect is 138 copies/mL. A negative result does not preclude SARS-Cov-2 infection and should not be used as the sole basis for treatment or other patient management decisions. A negative result may occur with  improper specimen collection/handling, submission of specimen other than nasopharyngeal swab, presence of viral mutation(s) within the areas targeted by this assay, and inadequate number of viral copies(<138 copies/mL). A negative result must be combined with clinical observations, patient history, and epidemiological information. The expected result is Negative.  Fact Sheet for Patients:  EntrepreneurPulse.com.au  Fact Sheet for Healthcare Providers:  IncredibleEmployment.be  This test is no t yet approved or cleared by the [place] FDA and  has been authorized for detection and/or diagnosis of SARS-CoV-2 by FDA under an Emergency Use Authorization (EUA). This EUA will remain  in effect (meaning this test can be used) for the duration of the COVID-19 declaration under Section 564(b)(1) of the Act, 21 U.S.C.section 360bbb-3(b)(1), unless the authorization is terminated  or revoked sooner.       Influenza A by PCR NEGATIVE NEGATIVE Final   Influenza B by PCR NEGATIVE NEGATIVE Final     Comment: (NOTE) The Xpert Xpress SARS-CoV-2/FLU/RSV plus assay is intended as an aid  in the diagnosis of influenza from Nasopharyngeal swab specimens and should not be used as a sole basis for treatment. Nasal washings and aspirates are unacceptable for Xpert Xpress SARS-CoV-2/FLU/RSV testing.  Fact Sheet for Patients: EntrepreneurPulse.com.au  Fact Sheet for Healthcare Providers: IncredibleEmployment.be  This test is not yet approved or cleared by the [place] FDA and has been authorized for detection and/or diagnosis of SARS-CoV-2 by FDA under an Emergency Use Authorization (EUA). This EUA will remain in effect (meaning this test can be used) for the duration of the COVID-19 declaration under Section 564(b)(1) of the Act, 21 U.S.C. section 360bbb-3(b)(1), unless the authorization is terminated or revoked.  Performed at [facility], [place] [address] [place], [place] [address]   SARS CORONAVIRUS 2 (TAT 6-24 HRS)     Status: None   Collection Time: 07/20/20 12:23 PM  Result Value Ref Range Status   SARS Coronavirus 2 NEGATIVE NEGATIVE Final    Comment: (NOTE) SARS-CoV-2 target nucleic acids are NOT DETECTED.  The SARS-CoV-2 RNA is generally detectable in upper and lower respiratory specimens during the acute phase of infection. Negative results do not preclude SARS-CoV-2 infection, do not rule out co-infections with other pathogens, and should not be used as the sole basis for treatment or other patient management decisions. Negative results must be combined with clinical observations, patient history, and epidemiological information. The expected result is Negative.  Fact Sheet for Patients: SugarRoll.be  Fact Sheet for Healthcare Providers: https://www.woods-mathews.com/  This test is not yet approved or cleared by the [place] FDA and  has been authorized for  detection and/or diagnosis of SARS-CoV-2 by FDA under an Emergency Use Authorization (EUA). This EUA will remain  in effect (meaning this test can be used) for the duration of the COVID-19 declaration under Se ction 564(b)(1) of the Act, 21 U.S.C. section 360bbb-3(b)(1), unless the authorization is terminated or revoked sooner.  Performed at [facility], [place] [address] [place], [place] [address]     Procedures and diagnostic studies:  No results found.             LOS: 7 days   [facility]   Pager on [email or web address]. If 7PM-7AM, please contact night-coverage at [email or web address]     07/20/2020, 7:13 PM

## 2020-07-20 NOTE — Progress Notes (Signed)
Yesterday, Ms. Fonte's hemoglobin was 6.6.  I thought that maybe we would be seeing a response to the Procrit that she is getting.  Today, her hemoglobin is 5.9.  There is been no bleeding.  I will go ahead and give her a dose of iron.  She is on L93 and folic acid.  I am not sure when she will be going back to nursing home or skilled nursing.  She seems to be doing pretty well overall.  Her appetite is okay.  She has had no nausea or vomiting.  Her total CBC shows a white count 6.3.  Hemoglobin 5.9.  Platelet count 164,000.  Her vital signs all look pretty stable.  Temperature 98.1.  Pulse 57.  Blood pressure 123/51.  Her blood pressure is a lot better now.  Her blood pressure medications were adjusted because of the elevated blood pressure due to the ESA administration.  I still suspect that she is going to respond.  Again, she has really only been on Procrit for a week.  She will continue to receive a dose every other day.  Hopefully the iron that she will get today will help.  I appreciate the wonderful care that she is getting from all the staff on [place], [place]  Psalm 55:22

## 2020-07-20 NOTE — TOC Progression Note (Addendum)
Transition of Care [facility]) - Progression Note    Patient Details  Name: Shawna Lin MRN: [id number] Date of Birth: 14-Apr-1929  Transition of Care [facility]) CM/SW [place], [place] Phone Number: 07/20/2020, 3:55 PM  Clinical Narrative:    [facility] unable to accept the patient for Rehab. CSW notified niece Shawna Lin.   CSW notified [facility].  Per physician request ,csw inquired if the SNF can provide Procrit injection. Per SNF Director of Nursing the facility cannot provide Procrit injection due the cost.   [facility] staff will continue to follow this patient.    Expected Discharge Plan: [place] Barriers to Discharge: Continued Medical Work up  Expected Discharge Plan and Services Expected Discharge Plan: [place] In-house Referral: Clinical Social Work Discharge Planning Services: CM Consult Post Acute Care Choice: [place] arrangements for the past 2 months: [place] (Memory Care)                                       Social Determinants of Health (SDOH) Interventions    Readmission Risk Interventions No flowsheet data found.

## 2020-07-20 NOTE — Plan of Care (Signed)
Care plan reviewed.

## 2020-07-20 NOTE — Progress Notes (Signed)
OT Cancellation Note  Patient Details Name: Shawna Lin MRN: [id number] DOB: [birth year]   Cancelled Treatment:    Reason Eval/Treat Not Completed: Medical issues which prohibited therapy: Pt currently with Hgb of 5.9. Per protocol, rehab to hold with Hgb under 7.0.  Will continue efforts.   Julien Girt 07/20/2020, 11:18 AM

## 2020-07-20 NOTE — Progress Notes (Signed)
CRITICAL VALUE ALERT  Critical Value:  Hbg: 5.9  Date & Time Notied:  07/20/2020  Provider Notified: 07/20/2020 @ 0520  Orders Received/Actions taken: No new orders at this time.

## 2020-07-21 DIAGNOSIS — S32020D Wedge compression fracture of second lumbar vertebra, subsequent encounter for fracture with routine healing: Secondary | ICD-10-CM | POA: Diagnosis not present

## 2020-07-21 LAB — CBC WITH DIFFERENTIAL/PLATELET
Abs Immature Granulocytes: 0.17 10*3/uL — ABNORMAL HIGH (ref 0.00–0.07)
Basophils Absolute: 0.1 10*3/uL (ref 0.0–0.1)
Basophils Relative: 1 %
Eosinophils Absolute: 0.2 10*3/uL (ref 0.0–0.5)
Eosinophils Relative: 3 %
HCT: 21.5 % — ABNORMAL LOW (ref 36.0–46.0)
Hemoglobin: 6.1 g/dL — CL (ref 12.0–15.0)
Immature Granulocytes: 2 %
Lymphocytes Relative: 27 %
Lymphs Abs: 2 10*3/uL (ref 0.7–4.0)
MCH: 24.5 pg — ABNORMAL LOW (ref 26.0–34.0)
MCHC: 28.4 g/dL — ABNORMAL LOW (ref 30.0–36.0)
MCV: 86.3 fL (ref 80.0–100.0)
Monocytes Absolute: 0.8 10*3/uL (ref 0.1–1.0)
Monocytes Relative: 12 %
Neutro Abs: 4 10*3/uL (ref 1.7–7.7)
Neutrophils Relative %: 55 %
Platelets: 174 10*3/uL (ref 150–400)
RBC: 2.49 MIL/uL — ABNORMAL LOW (ref 3.87–5.11)
RDW: 24.5 % — ABNORMAL HIGH (ref 11.5–15.5)
WBC: 7.2 10*3/uL (ref 4.0–10.5)
nRBC: 13.1 % — ABNORMAL HIGH (ref 0.0–0.2)

## 2020-07-21 LAB — RETICULOCYTES
Immature Retic Fract: 38.9 % — ABNORMAL HIGH (ref 2.3–15.9)
RBC.: 2.44 MIL/uL — ABNORMAL LOW (ref 3.87–5.11)
Retic Count, Absolute: 111 10*3/uL (ref 19.0–186.0)
Retic Ct Pct: 4.6 % — ABNORMAL HIGH (ref 0.4–3.1)

## 2020-07-21 NOTE — Progress Notes (Signed)
Physical Therapy Treatment Patient Details Name: Shawna Lin MRN: [id number] DOB: 04/17/1929 Today's Date: 07/21/2020    History of Present Illness 87 y.o. female with medical history significant of dementia, hypertension, seizure disorder, prolonged QTC, vitamin D deficiency who presented from the facility after witnessed mechanical fall.  Pt admitted for acute L2 compression fracture secondary to possible mechanical fall and anemia    PT Comments    Pt ambulated short distance with chair following for safety.  Pt denies dizziness, only reports fatigue and being cold.  Continue to recommend SNF upon d/c.  Follow Up Recommendations  SNF     Equipment Recommendations  None recommended by PT    Recommendations for Other Services       Precautions / Restrictions Precautions Precautions: Fall Required Braces or Orthoses: Spinal Brace Spinal Brace: Applied in sitting position;Lumbar corset  Required repositioning as brace with up around chest upon arrival to room   Mobility  Bed Mobility               General bed mobility comments: up in recliner  Transfers Overall transfer level: Needs assistance Equipment used: Rolling walker (2 wheeled) Transfers: Sit to/from Stand Sit to Stand: Min assist         General transfer comment: multimodal cues for technique, pt reliant on UE support, assist to rise  Ambulation/Gait Ambulation/Gait assistance: Min guard Gait Distance (Feet): 15 Feet Assistive device: Rolling walker (2 wheeled) Gait Pattern/deviations: Step-through pattern;Decreased stride length;Narrow base of support     General Gait Details: verbal cues for RW Positioning and encouragement to continue, recliner following for safety   Stairs             Wheelchair Mobility    Modified Rankin (Stroke Patients Only)       Balance                                            Cognition Arousal/Alertness: Awake/alert Behavior  During Therapy: WFL for tasks assessed/performed Overall Cognitive Status: No family/caregiver present to determine baseline cognitive functioning Area of Impairment: Orientation;Safety/judgement                 Orientation Level: Disoriented to;Place;Time;Situation       Safety/Judgement: Decreased awareness of safety;Decreased awareness of deficits     General Comments: hx dementia, pt cooperative and following simple commands      Exercises      General Comments        Pertinent Vitals/Pain Pain Assessment: Faces Faces Pain Scale: No hurt Pain Intervention(s): Monitored during session;Repositioned    Home Living                      Prior Function            PT Goals (current goals can now be found in the care plan section) Progress towards PT goals: Progressing toward goals    Frequency    Min 3X/week      PT Plan Current plan remains appropriate    Co-evaluation              AM-PAC PT "6 Clicks" Mobility   Outcome Measure  Help needed turning from your back to your side while in a flat bed without using bedrails?: A Little Help needed moving from lying on your back to sitting on the side of  a flat bed without using bedrails?: A Little Help needed moving to and from a bed to a chair (including a wheelchair)?: A Little Help needed standing up from a chair using your arms (e.g., wheelchair or bedside chair)?: A Little Help needed to walk in hospital room?: A Little Help needed climbing 3-5 steps with a railing? : A Lot 6 Click Score: 17    End of Session Equipment Utilized During Treatment: Gait belt;Back brace Activity Tolerance: Patient tolerated treatment well;No increased pain Patient left: in chair;with call bell/phone within reach;with chair alarm set Nurse Communication: Mobility status PT Visit Diagnosis: Difficulty in walking, not elsewhere classified (R26.2);Muscle weakness (generalized) (M62.81)     Time:  1027-2536 PT Time Calculation (min) (ACUTE ONLY): 15 min  Charges:  $Gait Training: 8-22 mins                     [name] PT, DPT Acute Rehabilitation Services Pager: [phone] Office: [phone]  Nohea Kras,KATHrine E 07/21/2020, 1:32 PM

## 2020-07-21 NOTE — Care Management Important Message (Signed)
Important Message  Patient Details IM Letter given to the Patient. Name: Shawna Lin MRN: [id number] Date of Birth: 07-13-1929   Medicare Important Message Given:  Yes     [name] 07/21/2020, 12:34 PM

## 2020-07-21 NOTE — Progress Notes (Signed)
Patient becoming increasingly agitated, trying to walk with out assistance or following commands. Placed patient back in chair with alarm on. Calmed down at moment.

## 2020-07-21 NOTE — Progress Notes (Signed)
Overall, everything is holding pretty steady.  Her hemoglobin is 6.1.  No she has some iron yesterday.  This is to be an issue with respect to her getting Procrit at the skilled nursing facility.  If not, then she will have to get Procrit at the office.  There is no obvious bleeding.  I still feel that the Procrit will get her blood count back up.  Once we get the hemoglobin above 7, then we can start pulling back on the Procrit a little bit.  I just feel bad then she will had to be in the hospital.  I know that she is getting great care from all the staff on 3 [address].  Lattie Haw, MD  1 John 3:18

## 2020-07-21 NOTE — Plan of Care (Signed)
  Problem: Education: Goal: Knowledge of General Education information will improve Description: Including pain rating scale, medication(s)/side effects and non-pharmacologic comfort measures Outcome: Progressing   Problem: Activity: Goal: Risk for activity intolerance will decrease Outcome: Progressing   Problem: Clinical Measurements: Goal: Diagnostic test results will improve Outcome: Progressing   

## 2020-07-21 NOTE — Progress Notes (Addendum)
PROGRESS NOTE    Shawna Lin  LZJ:[id number] DOB: [birth year] DOA: 07/12/2020 PCP: System, Provider Not In    Chief Complaint  Patient presents with  . Fall    Brief Narrative:  Medical records reviewed and are as summarized below:  Shawna Lin is a 87 y.o. female with medical history significant ofdementia, hypertension, seizure disorder, prolonged QTC,vitamin D deficiency who presented from the facility after witnessed mechanical fall.  Apparently, patient was seen ambulating without her walker.  Unfortunately, she fell before staff could get to her.  She was found to have acute L2 vertebral body compression fracture.  She was seen in consultation by the neurosurgeon who recommended conservative management with LSO brace, pain control and physical therapy.  She also has severe anemia.  However, she refused blood transfusion because she is Jehovah Witness.  Subjective:  She is pleasantly demented, knows she is in the hospital, not oriented to time She denies pain, sitting upright in chair  Assessment & Plan:   Principal Problem:   L2 vertebral fracture ([place]) Active Problems:   HTN (hypertension)   Dementia (HCC)   Seizure disorder ([place])   Anemia   Fall   CKD (chronic kidney disease), stage III ([place])   AKI (acute kidney injury) ([place])  1.acute L2 compression fracture secondary to mechanical fall CT of the L-spine done on admission showed L2 vertebral body compression fracture with approximately 50% height loss, 2 mm of focal central repulsion without spinal stenosis. CT head CT C-spine with no acute abnormalities.  Neurosurgery recommended conservative management with LSO brace, pain control and physical therapy and outpatient follow-up with neurosurgery Dr. Kathyrn Sheriff. She currently  denies pain    2. Anemia/low vitamin B12 levels hemoglobin nadir at 5.2 this hospitalization, Last hemoglobin February 2021 was 9.5.  FOBT negative. TSH within normal limits.  Vitamin B12 of 196. Anemia panel with iron of 34, TIBC of 4 7, folate of 15.3. Ferritin level at 32.  Patient noted to be a Jehovah's Witness and refusing blood products at this time and per admitting physician per patient's niece/[name].   hematology, Dr. Marin Olp consulted and reviewed blood smear and noted patient to have significant microcytic and hypochromic red blood cells, normal white cell morphology, no immature myeloid cells, couple of nucleated red blood cells.  She was started on epo every 48 hours and she has also received IV iron infusion.  Continue vitamin B12 supplementation, folic acid.    Per hematologist, blood count may take about a week to improve. She is medically stable but not able to discharge her due to nursing home does not do EPO injection every 48 hours  per Dr. Marin Olp may change EPO injection to weekly once hemoglobin above 7, Dr. Marin Olp continue to follow while patient is in the hospital, hgb today is 6.1   3. Acute kidney injury on chronic kidney disease stage IIIa Suspect related to severe anemia. Creatinine is trending down.  Creatinine on presentation was 1.46. 2021 was 1.04.  Lasix is on hold.   4. Dementia Stable. Continue home regimen sertraline, olanzapine.  Goals of care discussion  with niece [name] over the phone , she reported no aggressive procedures desired due to patient advanced age and progressive dementia , no EGD or colonoscopy for anemia work-up  She  agree with subcu injection of Procrit and IV iron , she agreed to talk to palliative care   [name] reported the patient received 1 dose of Moderna vaccine in November, she would like  patient to receive the second dose here if possible.  I have sent message to RN to explore options.   5. Hypertension Lasix on hold. Continue Norvasc 5 mg daily.   6. History of seizures Continue [place] facility will not accept patient due to she is getting EPO  injection every 48 hours      DVT prophylaxis: Place and maintain sequential compression device Start: 07/13/20 1926 SCDs Start: 07/12/20 1730   Code Status: Full Family Communication: Niece over the phone on January 5 Disposition:   Status is: Inpatient  Dispo: The patient is from: Home              Anticipated d/c is to: Skilled nursing facility placement              Anticipated d/c date is: To be determined, patient is stable to discharge, however skilled nursing facility want to every 48 hour epo subcu injection                 Consultants:   Neurosurgery  Hematology oncology Dr. Marin Olp  Palliative care  Procedures:   None  Antimicrobials:   None     Objective: Vitals:   07/20/20 0517 07/20/20 1348 07/20/20 2237 07/21/20 0548  BP: (!) 123/51 (!) 103/54 (!) 121/42 (!) 115/53  Pulse: (!) 57 (!) 49 61 (!) 55  Resp: 16 18 20 15   Temp: 98.1 F (36.7 C) 97.6 F (36.4 C)    TempSrc: Oral Oral    SpO2: 100% 100% 97% 99%  Weight:      Height:        Intake/Output Summary (Last 24 hours) at 07/21/2020 1407 Last data filed at 07/21/2020 1000 Gross per 24 hour  Intake 340 ml  Output --  Net 340 ml   Filed Weights   07/13/20 0100  Weight: 75.2 kg    Examination:  General exam: Frail , calm, NAD Respiratory system: Clear to auscultation. Respiratory effort normal. Cardiovascular system: S1 & S2 heard, RRR. No JVD, no murmur, No pedal edema. Gastrointestinal system: Abdomen is nondistended, soft and nontender. Normal bowel sounds heard. Central nervous system: Alert and pleasantly demented.  Extremities: Generalized weakness, moving all extremities Skin: No rashes, lesions or ulcers Psychiatry: Pleasantly demented    Data Reviewed: I have personally reviewed following labs and imaging studies  CBC: Recent Labs  Lab 07/15/20 0335 07/16/20 0317 07/17/20 0302 07/19/20 0756 07/20/20 0449 07/21/20 0422  WBC 5.7 6.1  --  6.4 6.3 7.2  NEUTROABS  3.2  --   --  3.5 3.4 4.0  HGB 5.3* 5.2* 5.4* 6.6* 5.9* 6.1*  HCT 18.1* 17.7* 19.3* 23.3* 20.9* 21.5*  MCV 81.2 81.6  --  84.7 85.3 86.3  PLT 143* 143*  --  184 164 034    Basic Metabolic Panel: Recent Labs  Lab 07/15/20 0335 07/16/20 0317  NA 142 142  K 3.8 4.2  CL 109 108  CO2 25 26  GLUCOSE 84 80  BUN 19 23  CREATININE 1.48* 1.42*  CALCIUM 9.2 9.1  PHOS 3.2  --     GFR: Estimated Creatinine Clearance: 23.7 mL/min (A) (by C-G formula based on SCr of 1.42 mg/dL (H)).  Liver Function Tests: Recent Labs  Lab 07/15/20 0335  ALBUMIN 3.6    CBG: No results for input(s): GLUCAP in the last 168 hours.   Recent Results (from the past 240 hour(s))  Resp Panel by RT-PCR (Flu A&B, Covid) Nasopharyngeal Swab  Status: None   Collection Time: 07/12/20  5:23 PM   Specimen: Nasopharyngeal Swab; Nasopharyngeal(NP) swabs in vial transport medium  Result Value Ref Range Status   SARS Coronavirus 2 by RT PCR NEGATIVE NEGATIVE Final    Comment: (NOTE) SARS-CoV-2 target nucleic acids are NOT DETECTED.  The SARS-CoV-2 RNA is generally detectable in upper respiratory specimens during the acute phase of infection. The lowest concentration of SARS-CoV-2 viral copies this assay can detect is 138 copies/mL. A negative result does not preclude SARS-Cov-2 infection and should not be used as the sole basis for treatment or other patient management decisions. A negative result may occur with  improper specimen collection/handling, submission of specimen other than nasopharyngeal swab, presence of viral mutation(s) within the areas targeted by this assay, and inadequate number of viral copies(<138 copies/mL). A negative result must be combined with clinical observations, patient history, and epidemiological information. The expected result is Negative.  Fact Sheet for Patients:  EntrepreneurPulse.com.au  Fact Sheet for Healthcare Providers:   IncredibleEmployment.be  This test is no t yet approved or cleared by the [place] FDA and  has been authorized for detection and/or diagnosis of SARS-CoV-2 by FDA under an Emergency Use Authorization (EUA). This EUA will remain  in effect (meaning this test can be used) for the duration of the COVID-19 declaration under Section 564(b)(1) of the Act, 21 U.S.C.section 360bbb-3(b)(1), unless the authorization is terminated  or revoked sooner.       Influenza A by PCR NEGATIVE NEGATIVE Final   Influenza B by PCR NEGATIVE NEGATIVE Final    Comment: (NOTE) The Xpert Xpress SARS-CoV-2/FLU/RSV plus assay is intended as an aid in the diagnosis of influenza from Nasopharyngeal swab specimens and should not be used as a sole basis for treatment. Nasal washings and aspirates are unacceptable for Xpert Xpress SARS-CoV-2/FLU/RSV testing.  Fact Sheet for Patients: EntrepreneurPulse.com.au  Fact Sheet for Healthcare Providers: IncredibleEmployment.be  This test is not yet approved or cleared by the [place] FDA and has been authorized for detection and/or diagnosis of SARS-CoV-2 by FDA under an Emergency Use Authorization (EUA). This EUA will remain in effect (meaning this test can be used) for the duration of the COVID-19 declaration under Section 564(b)(1) of the Act, 21 U.S.C. section 360bbb-3(b)(1), unless the authorization is terminated or revoked.  Performed at [facility], [place] [address] [place], [place] [address]   SARS CORONAVIRUS 2 (TAT 6-24 HRS)     Status: None   Collection Time: 07/20/20 12:23 PM  Result Value Ref Range Status   SARS Coronavirus 2 NEGATIVE NEGATIVE Final    Comment: (NOTE) SARS-CoV-2 target nucleic acids are NOT DETECTED.  The SARS-CoV-2 RNA is generally detectable in upper and lower respiratory specimens during the acute phase of infection. Negative results do not  preclude SARS-CoV-2 infection, do not rule out co-infections with other pathogens, and should not be used as the sole basis for treatment or other patient management decisions. Negative results must be combined with clinical observations, patient history, and epidemiological information. The expected result is Negative.  Fact Sheet for Patients: SugarRoll.be  Fact Sheet for Healthcare Providers: https://www.woods-mathews.com/  This test is not yet approved or cleared by the [place] FDA and  has been authorized for detection and/or diagnosis of SARS-CoV-2 by FDA under an Emergency Use Authorization (EUA). This EUA will remain  in effect (meaning this test can be used) for the duration of the COVID-19 declaration under Se ction 564(b)(1) of the Act,  21 U.S.C. section 360bbb-3(b)(1), unless the authorization is terminated or revoked sooner.  Performed at [facility], [place] [address] [place], [place] [address]          Radiology Studies: No results found.      Scheduled Meds: . acetaminophen  500 mg Oral BID  . amLODipine  10 mg Oral Daily  . cloNIDine  0.2 mg Oral Daily  . epoetin alfa  40,000 Units Subcutaneous Q48H  . feeding supplement  237 mL Oral BID BM  . folic acid  2 mg Oral Daily  . hydrocerin   Topical BID  . levETIRAcetam  500 mg Oral BID  . OLANZapine  2.5 mg Oral QHS  . pantoprazole  40 mg Oral Q0600  . senna-docusate  1 tablet Oral QHS  . sertraline  100 mg Oral Daily   Continuous Infusions: . sodium chloride Stopped (07/20/20 1300)     LOS: 8 days   Time spent: 30mins Greater than 50% of this time was spent in counseling, explanation of diagnosis, planning of further management, and coordination of care.  I have personally reviewed and interpreted on  07/21/2020 daily labs, I reviewed all nursing notes, pharmacy notes, consultant notes,  vitals, pertinent old records  I have discussed plan of  care as described above with RN , patient on 07/21/2020  Voice Recognition /Dragon dictation system was used to create this note, attempts have been made to correct errors. Please contact the author with questions and/or clarifications.   Florencia Reasons, MD PhD FACP [facility]  Available via Epic secure chat 7am-7pm for nonurgent issues Please page for urgent issues To page the attending provider between 7A-7P or the covering provider during after hours 7P-7A, please log into the web site [email or web address] and access using universal [place] password for that web site. If you do not have the password, please call the hospital operator.    07/21/2020, 2:07 PM

## 2020-07-22 DIAGNOSIS — R531 Weakness: Secondary | ICD-10-CM | POA: Diagnosis not present

## 2020-07-22 DIAGNOSIS — N1831 Chronic kidney disease, stage 3a: Secondary | ICD-10-CM | POA: Diagnosis not present

## 2020-07-22 DIAGNOSIS — W19XXXA Unspecified fall, initial encounter: Secondary | ICD-10-CM | POA: Diagnosis not present

## 2020-07-22 DIAGNOSIS — S32020D Wedge compression fracture of second lumbar vertebra, subsequent encounter for fracture with routine healing: Secondary | ICD-10-CM | POA: Diagnosis not present

## 2020-07-22 DIAGNOSIS — Z7189 Other specified counseling: Secondary | ICD-10-CM | POA: Diagnosis not present

## 2020-07-22 DIAGNOSIS — Z515 Encounter for palliative care: Secondary | ICD-10-CM

## 2020-07-22 DIAGNOSIS — S32020A Wedge compression fracture of second lumbar vertebra, initial encounter for closed fracture: Secondary | ICD-10-CM

## 2020-07-22 DIAGNOSIS — F0150 Vascular dementia without behavioral disturbance: Secondary | ICD-10-CM

## 2020-07-22 DIAGNOSIS — N179 Acute kidney failure, unspecified: Secondary | ICD-10-CM | POA: Diagnosis not present

## 2020-07-22 LAB — RETICULOCYTES
Immature Retic Fract: 43.7 % — ABNORMAL HIGH (ref 2.3–15.9)
RBC.: 2.75 MIL/uL — ABNORMAL LOW (ref 3.87–5.11)
Retic Count, Absolute: 159.5 10*3/uL (ref 19.0–186.0)
Retic Ct Pct: 5.8 % — ABNORMAL HIGH (ref 0.4–3.1)

## 2020-07-22 LAB — CBC WITH DIFFERENTIAL/PLATELET
Abs Immature Granulocytes: 0.25 10*3/uL — ABNORMAL HIGH (ref 0.00–0.07)
Basophils Absolute: 0.1 10*3/uL (ref 0.0–0.1)
Basophils Relative: 1 %
Eosinophils Absolute: 0.2 10*3/uL (ref 0.0–0.5)
Eosinophils Relative: 3 %
HCT: 24.2 % — ABNORMAL LOW (ref 36.0–46.0)
Hemoglobin: 6.7 g/dL — CL (ref 12.0–15.0)
Immature Granulocytes: 3 %
Lymphocytes Relative: 27 %
Lymphs Abs: 2.1 10*3/uL (ref 0.7–4.0)
MCH: 24.7 pg — ABNORMAL LOW (ref 26.0–34.0)
MCHC: 27.7 g/dL — ABNORMAL LOW (ref 30.0–36.0)
MCV: 89.3 fL (ref 80.0–100.0)
Monocytes Absolute: 0.8 10*3/uL (ref 0.1–1.0)
Monocytes Relative: 11 %
Neutro Abs: 4.2 10*3/uL (ref 1.7–7.7)
Neutrophils Relative %: 55 %
Platelets: 196 10*3/uL (ref 150–400)
RBC: 2.71 MIL/uL — ABNORMAL LOW (ref 3.87–5.11)
RDW: 26.1 % — ABNORMAL HIGH (ref 11.5–15.5)
WBC: 7.6 10*3/uL (ref 4.0–10.5)
nRBC: 15.1 % — ABNORMAL HIGH (ref 0.0–0.2)

## 2020-07-22 LAB — SARS CORONAVIRUS 2 (TAT 6-24 HRS): SARS Coronavirus 2: NEGATIVE

## 2020-07-22 MED ORDER — COVID-19 MRNA VACC (MODERNA) 50 MCG/0.25ML IM SUSP: 0.2500 mL | Freq: Once | INTRAMUSCULAR | Status: DC

## 2020-07-22 MED ORDER — COVID-19 MRNA VACC (MODERNA) 100 MCG/0.5ML IM SUSP
0.5000 mL | Freq: Once | INTRAMUSCULAR | Status: AC
  Administered 2020-07-22: 0.5 mL via INTRAMUSCULAR
  Filled 2020-07-22: qty 0.5

## 2020-07-22 MED ORDER — AMLODIPINE BESYLATE 10 MG PO TABS: 10.0000 mg | ORAL_TABLET | Freq: Every day | ORAL | Status: DC

## 2020-07-22 MED ORDER — VITAMIN B-12 1000 MCG PO TABS: 1000.0000 ug | ORAL_TABLET | Freq: Every day | ORAL | Status: DC

## 2020-07-22 MED ORDER — FUROSEMIDE 20 MG PO TABS: 20.0000 mg | ORAL_TABLET | Freq: Every day | ORAL | Status: DC | PRN

## 2020-07-22 MED ORDER — DARBEPOETIN ALFA 300 MCG/0.6ML IJ SOSY
300.0000 ug | PREFILLED_SYRINGE | Freq: Once | INTRAMUSCULAR | Status: AC
  Administered 2020-07-22: 300 ug via SUBCUTANEOUS
  Filled 2020-07-22: qty 0.6

## 2020-07-22 MED ORDER — SENNOSIDES-DOCUSATE SODIUM 8.6-50 MG PO TABS: 1.0000 | ORAL_TABLET | Freq: Every day | ORAL | Status: DC

## 2020-07-22 MED ORDER — FOLIC ACID 1 MG PO TABS: 2.0000 mg | ORAL_TABLET | Freq: Every day | ORAL | Status: DC

## 2020-07-22 NOTE — TOC Progression Note (Signed)
Transition of Care [facility]) - Progression Note    Patient Details  Name: Shawna Lin MRN: [id number] Date of Birth: 1928-12-15  Transition of Care [facility]) CM/SW [place], LCSW Phone Number: 07/22/2020, 4:32 PM  Clinical Narrative:    Patient covid test pending.  The facility will accept the patient tomorrow. Niece updated.  PTAR to transport. TOC weekend staff to coordinate discharge.   Expected Discharge Plan: Skilled Nursing Facility Barriers to Discharge: Other (comment) (covid test pending)  Expected Discharge Plan and Services Expected Discharge Plan: [place] In-house Referral: Clinical Social Work Discharge Planning Services: CM Consult Post Acute Care Choice: [place] arrangements for the past 2 months: [place] (Memory Care) Expected Discharge Date: 07/22/20                                     Social Determinants of Health (SDOH) Interventions    Readmission Risk Interventions No flowsheet data found.

## 2020-07-22 NOTE — Plan of Care (Signed)
Plan of care reviewed. 

## 2020-07-22 NOTE — Discharge Summary (Addendum)
Discharge Summary  Shawna Lin OAC:[id number] DOB: [birth year]  PCP: Shawna Lin, Shawna Lin  Admit date: 07/12/2020 Discharge date: 07/22/2020  Time spent: 31mins, more than 50% time spent on coordination of care.   Recommendations for Outpatient Follow-up:  1. F/u with SNF MD within a week  for hospital discharge follow up, repeat cbc/bmp at follow up 2. Need to follow up with hematology (Dr Shawna Lin)  Lin one week for weekly epo injection and iv iron infusion  3. F/u with neurosurgery Dr Shawna Lin 4. Recommend palliative care to follow patient at skilled nursing facility   Discharge Diagnoses:  Active Hospital Problems   Diagnosis Date Noted  . L2 vertebral fracture ([place]) 07/12/2020  . Palliative care by specialist   . Goals of care, counseling/discussion   . General weakness   . Anemia 07/12/2020  . Fall 07/12/2020  . CKD (chronic kidney disease), stage III ([place]) 07/12/2020  . AKI (acute kidney injury) ([place]) 07/12/2020  . Seizure disorder ([place]) 08/25/2019  . Dementia ([place]) 12/21/2015  . HTN (hypertension) 09/02/2015    Resolved Hospital Problems  No resolved problems to display.    Discharge Condition: stable  Diet recommendation:  Discharge Diet Orders (From admission, onward)    Start     Ordered   07/22/20 0000  Diet - low sodium heart healthy        07/22/20 1338            Filed Weights   07/13/20 0100  Weight: 75.2 kg    History of present illness:  Shawna Nesbittis a 87 y.o.femalewith medical history significant ofdementia, hypertension, seizure disorder, prolonged QTC,vitamin D deficiency who presented from the facility after witnessed mechanical fall. Apparently, patient was seen ambulating without her walker. Unfortunately, she fell before staff could get to her.  She was found to have acute L2 vertebral body compression fracture. She was seen Lin consultation by the neurosurgeon who recommended conservative management with LSO brace, pain  control and physical therapy.  She also has severe anemia. However, she refused blood transfusion because she is Jehovah Witness.  Hospital Course:  Principal Problem:   L2 vertebral fracture ([place]) Active Problems:   HTN (hypertension)   Dementia ([facility])   Seizure disorder ([facility])   Anemia   Fall   CKD (chronic kidney disease), stage III ([place])   AKI (acute kidney injury) ([place])   Palliative care by specialist   Goals of care, counseling/discussion   General weakness   1.acute L2 compression fracture secondary to mechanical fall CT of the L-spine done on admission showed L2 vertebral body compression fracture with approximately 50% height loss, 2 mm of focal central repulsion without spinal stenosis.  CT head CT C-spine with no acute abnormalities.  Neurosurgery recommended conservative management with LSO brace, pain control and physical therapy and outpatient follow-up with neurosurgery Dr. Kathyrn Lin. She currently  denies pain    2. Anemia of chronic disease /low vitamin B12 levels hemoglobin nadir at 5.2 this hospitalization, Last hemoglobin February 2021 was 9.5.  FOBT negative. TSH within normal limits. Vitamin B12 of 196. Anemia panel with iron of 34, TIBC of 4 7, folate of 15.3. Ferritin level at 32.  Patient noted to be a Jehovah's Witness and refusing blood products at this time and per admitting physician per patient's niece/Shawna Lin.  no EGD or colonoscopy for anemia work-up due to advanced age, niece Shawna Lin agrees to it  hematology, Dr. Marin Lin consulted and reviewed blood smear and noted patient to have significant microcytic  and hypochromic red blood cells, normal white cell morphology, no immature myeloid cells, couple of nucleated red blood cells. She was started on epo every 48 hours and she has also received IV iron infusion.  Continue vitamin B12 supplementation, folic acid.  hgb has improved to 6.7 today, hematology oncology Dr. Marin Lin changed EPO  injection from every 48 hours to weekly ,she received one dose of aranesp on 1/7, she is cleared  to discharge to rehab Patient is to continue to see Dr. Marin Lin weekly Lin the clinic to receive EPO injection and IV iron,  3. Acute kidney injury on chronic kidney disease stage IIIa Suspect related to severe anemia. Creatinine is trending down. Creatinine on presentation was 1.46. 2021 was 1.04. Lasix is on hold Lin the hospital  resumed lasix on prn basis at discharge    4. Dementia Stable. Continue home regimen sertraline, olanzapine. she agreed to talk to palliative care, palliative care to continue follow at skilled nursing facility  [name] reported the patient received 1 dose of Moderna vaccine Lin November, she would like patient to receive the second dose which is ordered  And given on 1/7 .    5. Hypertension Lasix on hold. Continue Norvasc 10 mg daily.   6. History of seizures Continue Keppra     DVT prophylaxis: Place and maintain sequential compression device Start: 07/13/20 1926 SCDs Start: 07/12/20 1730  Code Status: Full Family Communication: Niece over the phone on 1/5 and 1/7 Disposition: snf       Consultants:   Neurosurgery  Hematology oncology Dr. Marin Lin  Palliative care  Procedures:   None  Antimicrobials:   None   Discharge Exam: BP (!) 114/58 (BP Location: Right Arm)   Pulse (!) 46   Temp 97.8 F (36.6 C) (Oral)   Resp 20   Ht 4\' 11"  (1.499 m)   Wt 75.2 kg   SpO2 100%   BMI 33.48 kg/m   General: Pleasantly demented, NAD Cardiovascular: RRR Respiratory: Normal respiratory effort  Discharge Instructions You were cared for by a hospitalist during your hospital stay. If you have any questions about your discharge medications or the care you received while you were Lin the hospital after you are discharged, you can call the unit and asked to speak with the hospitalist on call if the hospitalist  that took care of you is not available. Once you are discharged, your primary care physician will handle any further medical issues. Please note that NO REFILLS for any discharge medications will be authorized once you are discharged, as it is imperative that you return to your primary care physician (or establish a relationship with a primary care physician if you do not have one) for your aftercare needs so that they can reassess your need for medications and monitor your lab values.  Discharge Instructions    Ambulatory referral to Hematology   Complete by: As directed    Need  to schedule anaresp injection and IV iron infusion on 1/14 then qweekly until further noticed by Dr Martha Clan   Diet - low sodium heart healthy   Complete by: As directed    Increase activity slowly   Complete by: As directed      Allergies as of 07/22/2020   No Known Allergies     Medication List    STOP taking these medications   Aspirin 81 MG EC tablet     TAKE these medications   acetaminophen 325 MG tablet Commonly known as: TYLENOL  Take 650 mg by mouth every 12 (twelve) hours as needed (knee pain).   amLODipine 10 MG tablet Commonly known as: NORVASC Take 1 tablet (10 mg total) by mouth daily. Start taking on: July 23, 2020 What changed:   medication strength  how much to take   cholecalciferol 25 MCG (1000 UNIT) tablet Commonly known as: VITAMIN D3 Take 1,000 Units by mouth daily.   Ensure Take 237 mLs by mouth 3 (three) times daily.   eucerin lotion Apply 1 mL topically Lin the morning and at bedtime. Apply 1 application to lower extremities twice daily   folic acid 1 MG tablet Commonly known as: FOLVITE Take 2 tablets (2 mg total) by mouth daily. Start taking on: July 23, 2020   furosemide 20 MG tablet Commonly known as: LASIX Take 1 tablet (20 mg total) by mouth daily as needed for fluid or edema. What changed:   when to take this  reasons to take this   levETIRAcetam  500 MG tablet Commonly known as: Keppra Take 1 tablet (500 mg total) by mouth 2 (two) times daily.   OLANZapine 2.5 MG tablet Commonly known as: ZYPREXA Take 2.5 mg by mouth at bedtime.   senna-docusate 8.6-50 MG tablet Commonly known as: Senokot-S Take 1 tablet by mouth at bedtime.   sertraline 100 MG tablet Commonly known as: ZOLOFT Take 100 mg by mouth daily.   vitamin B-12 1000 MCG tablet Commonly known as: CYANOCOBALAMIN Take 1 tablet (1,000 mcg total) by mouth daily.      No Known Allergies  Contact information for follow-up providers    Consuella Lose, MD Follow up Lin 3 week(s).   Specialty: Neurosurgery Why: L2 vertebral body compression fracture  Contact information: [address] [place] [address] [phone]        Volanda Napoleon, MD Follow up on 07/29/2020.   Specialty: Oncology Why: Anemia to get aranesp injection  and iv iron insufion Contact information: [place] [address] [phone]            Contact information for after-discharge care    Destination    [facility] Preferred SNF .   Service: Skilled Nursing Contact information: [address] [place] [phone]                   The results of significant diagnostics from this hospitalization (including imaging, microbiology, ancillary and laboratory) are listed below for reference.    Significant Diagnostic Studies: DG Chest 2 View  Result Date: 07/12/2020 CLINICAL DATA:  Recent fall.  Hypertension EXAM: CHEST - 2 VIEW COMPARISON:  Nov 22, 2019 FINDINGS: The lungs are clear. Heart is upper normal Lin size with pulmonary vascularity normal. No adenopathy. No pneumothorax. There is degenerative change Lin the lower thoracic spine and Lin each shoulder. IMPRESSION: Lungs clear.  Heart upper normal Lin size. Electronically Signed   By: Lowella Grip III M.D.   On: 07/12/2020 14:05    DG Knee 1-2 Views Right  Result Date: 07/12/2020 CLINICAL DATA:  Right knee pain after fall at nursing home. EXAM: RIGHT KNEE - 1-2 VIEW COMPARISON:  None. FINDINGS: No acute fracture or dislocation. There is patellar [place]. Moderate osteoarthritis with tricompartmental peripheral spurring, medial tibiofemoral joint space narrowing, and small knee joint effusion. Bones are diffusely under mineralized. Small quadriceps tendon enthesophyte. There are vascular calcifications. IMPRESSION: 1. No acute fracture or dislocation of the right knee. 2. Moderate  osteoarthritis and small joint effusion. Patella [place]. Electronically Signed   By: Keith Rake M.D.   On: 07/12/2020 19:50   DG Knee 2 Views Right  Addendum Date: 07/12/2020   ADDENDUM REPORT: 07/12/2020 14:11 CORRECT EXAM: PELVIS AND RIGHT HIP: 2+ VIEW Electronically Signed   By: Lowella Grip III M.D.   On: 07/12/2020 14:11   Result Date: 07/12/2020 CLINICAL DATA:  Pain following fall EXAM: RIGHT KNEE - 1-2 VIEW COMPARISON:  June 26, 2018 FINDINGS: Frontal pelvis as well as frontal and lateral right hip images were obtained. Bones osteoporotic. No acute fracture or dislocation. There is moderate symmetric narrowing of each hip joint. No erosive change. There is degenerative change Lin the lower lumbar spine. There are multiple foci of arterial vascular calcification Lin the pelvis and thigh regions. IMPRESSION: Symmetric narrowing of each hip joint. No acute fracture or dislocation. Bones osteoporotic. Multiple foci of arterial vascular calcification noted. Electronically Signed: By: Lowella Grip III M.D. On: 07/12/2020 14:07   CT Head Wo Contrast  Result Date: 07/12/2020 CLINICAL DATA:  [place] today with trauma to the head and neck. EXAM: CT HEAD WITHOUT CONTRAST CT CERVICAL SPINE WITHOUT CONTRAST TECHNIQUE: Multidetector CT imaging of the head and cervical spine was performed following the standard protocol without intravenous  contrast. Multiplanar CT image reconstructions of the cervical spine were also generated. COMPARISON:  08/27/2019 MRI. FINDINGS: CT HEAD FINDINGS Brain: Generalized atrophy, with temporal lobe predominance. Mild chronic small-vessel ischemic changes of the white matter. No sign of acute infarction, mass lesion, hemorrhage, hydrocephalus or extra-axial collection. Vascular: There is atherosclerotic calcification of the major vessels at the base of the brain. Skull: Chronic mild old appearance of the calvarium which could be due to demineralization, anemia or infiltrating marrow process. Lack of progressive change since January of this year argues against an aggressive process. History describes anemia and vitamin-D deficiency, which could relate to this finding. Sinuses/Orbits: Clear/normal Other: None CT CERVICAL SPINE FINDINGS Alignment: Curvature convex to the right. 2 mm degenerative anterolisthesis at C5-6. Skull base and vertebrae: No fracture. Mild appearance of the bone, similar to the calvarium, though not as pronounced. Soft tissues and spinal canal: No soft tissue neck lesion seen. Disc levels: Chronic degenerative spondylosis from C3-4 through C6-7. No apparent significant canal stenosis. Facet osteoarthritis most pronounced on the right at C5-6 and on the left at C2-3, C3-4 and C4-5. No severe bony foraminal stenosis. Upper chest: Negative Other: None IMPRESSION: HEAD CT: No acute or traumatic finding. Atrophy and chronic small-vessel ischemic changes. Atrophy is temporal lobe predominant. CERVICAL SPINE CT: No acute or traumatic finding. Chronic degenerative spondylosis and facet osteoarthritis. Electronically Signed   By: Nelson Chimes M.D.   On: 07/12/2020 13:48   CT Cervical Spine Wo Contrast  Result Date: 07/12/2020 CLINICAL DATA:  [place] today with trauma to the head and neck. EXAM: CT HEAD WITHOUT CONTRAST CT CERVICAL SPINE WITHOUT CONTRAST TECHNIQUE: Multidetector CT imaging of the head and  cervical spine was performed following the standard protocol without intravenous contrast. Multiplanar CT image reconstructions of the cervical spine were also generated. COMPARISON:  08/27/2019 MRI. FINDINGS: CT HEAD FINDINGS Brain: Generalized atrophy, with temporal lobe predominance. Mild chronic small-vessel ischemic changes of the white matter. No sign of acute infarction, mass lesion, hemorrhage, hydrocephalus or extra-axial collection. Vascular: There is atherosclerotic calcification of the major vessels at the base of the brain. Skull: Chronic mild old appearance of the calvarium which could be due to demineralization, anemia or  infiltrating marrow process. Lack of progressive change since January of this year argues against an aggressive process. History describes anemia and vitamin-D deficiency, which could relate to this finding. Sinuses/Orbits: Clear/normal Other: None CT CERVICAL SPINE FINDINGS Alignment: Curvature convex to the right. 2 mm degenerative anterolisthesis at C5-6. Skull base and vertebrae: No fracture. Mild appearance of the bone, similar to the calvarium, though not as pronounced. Soft tissues and spinal canal: No soft tissue neck lesion seen. Disc levels: Chronic degenerative spondylosis from C3-4 through C6-7. No apparent significant canal stenosis. Facet osteoarthritis most pronounced on the right at C5-6 and on the left at C2-3, C3-4 and C4-5. No severe bony foraminal stenosis. Upper chest: Negative Other: None IMPRESSION: HEAD CT: No acute or traumatic finding. Atrophy and chronic small-vessel ischemic changes. Atrophy is temporal lobe predominant. CERVICAL SPINE CT: No acute or traumatic finding. Chronic degenerative spondylosis and facet osteoarthritis. Electronically Signed   By: Nelson Chimes M.D.   On: 07/12/2020 13:48   CT Lumbar Spine Wo Contrast  Result Date: 07/12/2020 CLINICAL DATA:  Status post fall, low back pain EXAM: CT LUMBAR SPINE WITHOUT CONTRAST TECHNIQUE:  Multidetector CT imaging of the lumbar spine was performed without intravenous contrast administration. Multiplanar CT image reconstructions were also generated. COMPARISON:  None. FINDINGS: Segmentation: 5 lumbar type vertebrae. Alignment: Grade 1 anterolisthesis of L5 on S1 secondary to bilateral L5 pars interarticularis defects. Osseous fusion across the L5-S1 disc space. Vertebrae: Severe osteopenia. L2 vertebral body compression fracture with approximately 50% height loss and 2 mm of focal central retropulsion without spinal stenosis. No aggressive osseous lesion. Paraspinal and other soft tissues: No acute paraspinal abnormality. Abdominal aortic atherosclerosis. Disc levels: Degenerative disease with disc height loss at L2-3, L3-4, and L4-5. Osseous fusion of the L5-S1 disc spaces. T12-L1: Broad-based disc osteophyte complex. No foraminal or central canal stenosis. L1-2: Minimal broad-based disc bulge. No foraminal or central canal stenosis. Mild bilateral facet arthropathy. L2-3: Mild broad-based disc bulge. Mild bilateral facet arthropathy. No foraminal or central canal stenosis. L3-4: Broad-based disc bulge. Mild bilateral facet arthropathy. Mild left foraminal stenosis. No right foraminal stenosis. No central canal stenosis. L4-5: Broad-based disc osteophyte complex. Moderate bilateral facet arthropathy. No definite foraminal or central canal stenosis. IMPRESSION: 1. Acute L2 vertebral body compression fracture with approximately 50% height loss and 2 mm of focal central retropulsion without spinal stenosis. 2. Diffuse lumbar spine spondylosis as described above. 3. Aortic Atherosclerosis (ICD10-I70.0). Electronically Signed   By: Kathreen Devoid   On: 07/12/2020 13:54   DG Hip Unilat W or Wo Pelvis 2-3 Views Right  Addendum Date: 07/13/2020   ADDENDUM REPORT: 07/13/2020 13:39 ADDENDUM: CORRECT EXAM: PELVIS AND RIGHT HIP: 2+ VIEW Electronically Signed   By: Lowella Grip III M.D.   On: 07/13/2020  13:39   Addendum Date: 07/13/2020   ADDENDUM REPORT: 07/12/2020 14:11 CORRECT EXAM: PELVIS AND RIGHT HIP: 2+ VIEW Electronically Signed   By: Lowella Grip III M.D.   On: 07/12/2020 14:11   Result Date: 07/13/2020 CLINICAL DATA:  Pain following fall EXAM: RIGHT KNEE - 1-2 VIEW COMPARISON:  June 26, 2018 FINDINGS: Frontal pelvis as well as frontal and lateral right hip images were obtained. Bones osteoporotic. No acute fracture or dislocation. There is moderate symmetric narrowing of each hip joint. No erosive change. There is degenerative change Lin the lower lumbar spine. There are multiple foci of arterial vascular calcification Lin the pelvis and thigh regions. IMPRESSION: Symmetric narrowing of each hip joint. No acute fracture  or dislocation. Bones osteoporotic. Multiple foci of arterial vascular calcification noted. Electronically Signed: By: Lowella Grip III M.D. On: 07/12/2020 14:07    Microbiology: Recent Results (from the past 240 hour(s))  Resp Panel by RT-PCR (Flu A&B, Covid) Nasopharyngeal Swab     Status: None   Collection Time: 07/12/20  5:23 PM   Specimen: Nasopharyngeal Swab; Nasopharyngeal(NP) swabs Lin vial transport medium  Result Value Ref Range Status   SARS Coronavirus 2 by RT PCR NEGATIVE NEGATIVE Final    Comment: (NOTE) SARS-CoV-2 target nucleic acids are NOT DETECTED.  The SARS-CoV-2 RNA is generally detectable Lin upper respiratory specimens during the acute phase of infection. The lowest concentration of SARS-CoV-2 viral copies this assay can detect is 138 copies/mL. A negative result does not preclude SARS-Cov-2 infection and should not be used as the sole basis for treatment or other patient management decisions. A negative result may occur with  improper specimen collection/handling, submission of specimen other than nasopharyngeal swab, presence of viral mutation(s) within the areas targeted by this assay, and inadequate number of  viral copies(<138 copies/mL). A negative result must be combined with clinical observations, patient history, and epidemiological information. The expected result is Negative.  Fact Sheet for Patients:  EntrepreneurPulse.com.au  Fact Sheet for Healthcare Providers:  IncredibleEmployment.be  This test is no t yet approved or cleared by the [place] FDA and  has been authorized for detection and/or diagnosis of SARS-CoV-2 by FDA under an Emergency Use Authorization (EUA). This EUA will remain  Lin effect (meaning this test can be used) for the duration of the COVID-19 declaration under Section 564(b)(1) of the Act, 21 U.S.C.section 360bbb-3(b)(1), unless the authorization is terminated  or revoked sooner.       Influenza A by PCR NEGATIVE NEGATIVE Final   Influenza B by PCR NEGATIVE NEGATIVE Final    Comment: (NOTE) The Xpert Xpress SARS-CoV-2/FLU/RSV plus assay is intended as an aid Lin the diagnosis of influenza from Nasopharyngeal swab specimens and should not be used as a sole basis for treatment. Nasal washings and aspirates are unacceptable for Xpert Xpress SARS-CoV-2/FLU/RSV testing.  Fact Sheet for Patients: EntrepreneurPulse.com.au  Fact Sheet for Healthcare Providers: IncredibleEmployment.be  This test is not yet approved or cleared by the [place] FDA and has been authorized for detection and/or diagnosis of SARS-CoV-2 by FDA under an Emergency Use Authorization (EUA). This EUA will remain Lin effect (meaning this test can be used) for the duration of the COVID-19 declaration under Section 564(b)(1) of the Act, 21 U.S.C. section 360bbb-3(b)(1), unless the authorization is terminated or revoked.  Performed at [facility], [place] [address] [place], [place] [address]   SARS CORONAVIRUS 2 (TAT 6-24 HRS)     Status: None   Collection Time: 07/20/20 12:23 PM  Result  Value Ref Range Status   SARS Coronavirus 2 NEGATIVE NEGATIVE Final    Comment: (NOTE) SARS-CoV-2 target nucleic acids are NOT DETECTED.  The SARS-CoV-2 RNA is generally detectable Lin upper and lower respiratory specimens during the acute phase of infection. Negative results do not preclude SARS-CoV-2 infection, do not rule out co-infections with other pathogens, and should not be used as the sole basis for treatment or other patient management decisions. Negative results must be combined with clinical observations, patient history, and epidemiological information. The expected result is Negative.  Fact Sheet for Patients: SugarRoll.be  Fact Sheet for Healthcare Providers: https://www.woods-mathews.com/  This test is not yet approved or cleared by the [place]  FDA and  has been authorized for detection and/or diagnosis of SARS-CoV-2 by FDA under an Emergency Use Authorization (EUA). This EUA will remain  Lin effect (meaning this test can be used) for the duration of the COVID-19 declaration under Se ction 564(b)(1) of the Act, 21 U.S.C. section 360bbb-3(b)(1), unless the authorization is terminated or revoked sooner.  Performed at [facility], [place] [address] [place], [place] [address]      Labs: Basic Metabolic Panel: Recent Labs  Lab 07/16/20 0317  NA 142  K 4.2  CL 108  CO2 26  GLUCOSE 80  BUN 23  CREATININE 1.42*  CALCIUM 9.1   Liver Function Tests: No results for input(s): AST, ALT, ALKPHOS, BILITOT, PROT, ALBUMIN Lin the last 168 hours. No results for input(s): LIPASE, AMYLASE Lin the last 168 hours. No results for input(s): AMMONIA Lin the last 168 hours. CBC: Recent Labs  Lab 07/16/20 0317 07/17/20 0302 07/19/20 0756 07/20/20 0449 07/21/20 0422 07/22/20 0403  WBC 6.1  --  6.4 6.3 7.2 7.6  NEUTROABS  --   --  3.5 3.4 4.0 4.2  HGB 5.2* 5.4* 6.6* 5.9* 6.1* 6.7*  HCT 17.7* 19.3* 23.3* 20.9* 21.5* 24.2*   MCV 81.6  --  84.7 85.3 86.3 89.3  PLT 143*  --  184 164 174 196   Cardiac Enzymes: No results for input(s): CKTOTAL, CKMB, CKMBINDEX, TROPONINI Lin the last 168 hours. BNP: BNP (last 3 results) No results for input(s): BNP Lin the last 8760 hours.  ProBNP (last 3 results) No results for input(s): PROBNP Lin the last 8760 hours.  CBG: No results for input(s): GLUCAP Lin the last 168 hours.     Signed:  Florencia Reasons MD, PhD, FACP  [facility] 07/22/2020, 2:54 PM

## 2020-07-22 NOTE — Progress Notes (Signed)
Daily Progress Note   Patient Name: Shawna Lin       Date: 07/22/2020 DOB: September 14, 1928  Age: 87 y.o. MRN#: [id number] Attending Physician: Shawna Reasons, MD Primary Care Physician: System, Provider Not In Admit Date: 07/12/2020  Reason for Consultation/Follow-up: Establishing goals of care  Subjective: Patient initially says that she lives in [place], however, she is also able to state correctly that she is at [facility], she knows she is in the hospital because she fell. She has finished about 70% of her lunch tray, she denies pain.   Length of Stay: 9  Current Medications: Scheduled Meds:  . acetaminophen  500 mg Oral BID  . amLODipine  10 mg Oral Daily  . cloNIDine  0.2 mg Oral Daily  . COVID-19 mRNA vaccine (Moderna)  0.5 mL Intramuscular Once  . darbepoetin (ARANESP) injection - NON-DIALYSIS  300 mcg Subcutaneous Once  . feeding supplement  237 mL Oral BID BM  . folic acid  2 mg Oral Daily  . hydrocerin   Topical BID  . levETIRAcetam  500 mg Oral BID  . OLANZapine  2.5 mg Oral QHS  . pantoprazole  40 mg Oral Q0600  . senna-docusate  1 tablet Oral QHS  . sertraline  100 mg Oral Daily    Continuous Infusions: . sodium chloride Stopped (07/20/20 1300)    PRN Meds: sodium chloride, LORazepam, morphine injection, oxyCODONE  Physical Exam         Awake alert Sitting up in chair Has baseline dementia, how ever, is able to answer few yes/no type questions appropriately.  S1 S2  Abdomen not distended Trace edema  Vital Signs: BP (!) 105/56 (BP Location: Left Arm)   Pulse (!) 53   Temp 98 F (36.7 C) (Oral)   Resp 15   Ht 4\' 11"  (1.499 m)   Wt 75.2 kg   SpO2 97%   BMI 33.48 kg/m  SpO2: SpO2: 97 % O2 Device: O2 Device: Room Air O2 Flow Rate:     Intake/output summary:   Intake/Output Summary (Last 24 hours) at 07/22/2020 1338 Last data filed at 07/22/2020 0820 Gross per 24 hour  Intake 260 ml  Output 3 ml  Net 257 ml   LBM: Last BM Date: 07/21/20 Baseline Weight: Weight: 75.2 kg Most recent weight: Weight: 75.2  kg      PPS 50% Palliative Assessment/Data:    Flowsheet Rows   Flowsheet Row Most Recent Value  Intake Tab   Referral Department Hospitalist  Unit at Time of Referral Med/Surg Unit  Palliative Care Primary Diagnosis Neurology  Date Notified 07/12/20  Palliative Care Type New Palliative care  Reason for referral Clarify Goals of Care  Date of Admission 07/12/20  Date first seen by Palliative Care 07/13/20  # of days Palliative referral response time 1 Day(s)  # of days IP prior to Palliative referral 0  Clinical Assessment   Palliative Performance Scale Score 50%  Psychosocial & Spiritual Assessment   Palliative Care Outcomes   Patient/Family meeting held? Yes  Who was at the meeting? Niece/HCPOA via phone      Patient Active Problem List   Diagnosis Date Noted  . L2 vertebral fracture ([place]) 07/12/2020  . Anemia 07/12/2020  . Fall 07/12/2020  . CKD (chronic kidney disease), stage III ([place]) 07/12/2020  . AKI (acute kidney injury) ([place]) 07/12/2020  . Syncope 08/26/2019  . Recurrent syncope 08/25/2019  . QT prolongation 08/25/2019  . Seizure disorder ([place]) 08/25/2019  . Pain due to onychomycosis of toenails of both feet 01/07/2019  . Dementia ([place]) 12/21/2015  . No blood products 12/21/2015  . 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 Memory loss 09/02/2015  . HTN (hypertension) 09/02/2015    Palliative Care Assessment & Plan   Patient Profile:  [name] is an 87 year old female with past medical history of dementia, hypertension, seizure disorder, prolonged QT, vitamin D deficiency who presented to the ED following a witnessed mechanical fall at her assisted living facility.  Per chart, she was seen  ambulating without her walker but fell before staff could get to her and hit her head on the door.  She did not lose consciousness.  She has been acting different since the fall and was brought to the ED for evaluation.  She was also complaining of low back and knee pain.  Work-up in the ED revealed hemoglobin of 5.9, creatinine of 1.46, and L2 vertebral body compression fracture with 50% height loss.  She was evaluated by neurosurgery with recommendation for lumbar brace.  Anemia on presentation without clear etiology and recommendation was for transfusion of packed red blood cells.  In discussion with her surrogate Shawna Lin, her niece) it was noted that she is [name] and therefore receiving blood products is against her religious beliefs.  Palliative consulted for goals of care.  Assessment: Mechanical fall, acute L2 compression fracture Anemia, low vitamin B12 levels Acute kidney injury with underlying slightly stage III chronic kidney disease Dementia  Recommendations/Plan:  Full code/full scope  Continue current mode of care  Skilled nursing facility for rehab, recommend palliative follow-up over there.  Discussed with bedside RN as well as TOC, no new palliative recommendations.  Goals of Care and Additional Recommendations:  Limitations on Scope of Treatment: Full Scope Treatment  Code Status:    Code Status Orders  (From admission, onward)         Start     Ordered   07/12/20 1730  Full code  Continuous        07/12/20 1733        Code Status History    Date Active Date Inactive Code Status Order ID Comments User Context   08/25/2019 2113 08/28/2019 1908 Full Code [id number]  Shela Leff, MD ED   12/21/2015 2154 12/23/2015 1719 Full Code [id number]  Dout[place],  Nyoka Lint, MD Inpatient   Advance Care Planning Activity       Prognosis:   Unable to determine  Discharge Planning:  [place] for rehab with Palliative care service  follow-up  Care plan was discussed with  IDT  Thank you for allowing the Palliative Medicine Team to assist in the care of this patient.   Time In: 1300 Time Out: 1325 Total Time 25 Prolonged Time Billed  no       Greater than 50%  of this time was spent counseling and coordinating care related to the above assessment and plan.  Loistine Chance, MD  Please contact Palliative Medicine Team phone at [phone] for questions and concerns.

## 2020-07-22 NOTE — NC FL2 (Signed)
[place] LEVEL OF CARE SCREENING TOOL     IDENTIFICATION  Patient Name: Shawna Lin Birthdate: [birth year] Sex: female Admission Date (Current Location): 07/12/2020  [facility] and [place] Number:  Herbalist and Address:  [facility],  [address] [place], [place]      Provider Number: [id number]  Attending Physician Name and Address:  Florencia Reasons, MD  Relative Name and Phone Number:  Carlyle Basques Niece   [phone]    Current Level of Care: Hospital Recommended Level of Care: [place] Prior Approval Number:    Date Approved/Denied:   PASRR Number:   [id number]  Discharge Plan: SNF    Current Diagnoses: Patient Active Problem List   Diagnosis Date Noted  . Palliative care by specialist   . Goals of care, counseling/discussion   . General weakness   . L2 vertebral fracture ([place]) 07/12/2020  . Anemia 07/12/2020  . Fall 07/12/2020  . CKD (chronic kidney disease), stage III ([place]) 07/12/2020  . AKI (acute kidney injury) ([place]) 07/12/2020  . Syncope 08/26/2019  . Recurrent syncope 08/25/2019  . QT prolongation 08/25/2019  . Seizure disorder ([place]) 08/25/2019  . Pain due to onychomycosis of toenails of both feet 01/07/2019  . Dementia ([place]) 12/21/2015  . No blood products 12/21/2015  . 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 Memory loss 09/02/2015  . HTN (hypertension) 09/02/2015    Orientation RESPIRATION BLADDER Height & Weight     Self  Normal Continent Weight: 165 lb 12.6 oz (75.2 kg) Height:  4\' 11"  (149.9 cm)  BEHAVIORAL SYMPTOMS/MOOD NEUROLOGICAL BOWEL NUTRITION STATUS      Continent Diet (Heart Healthy)  AMBULATORY STATUS COMMUNICATION OF NEEDS Skin   Extensive Assist Verbally Normal (L2 compression fracture)                       Personal Care Assistance Level of Assistance  Bathing,Feeding,Dressing Bathing Assistance: Maximum assistance Feeding assistance: Independent Dressing Assistance: Maximum  assistance     Functional Limitations Info  Sight,Speech,Hearing Sight Info: Adequate Hearing Info: Adequate Speech Info: Adequate    SPECIAL CARE FACTORS FREQUENCY  PT (By licensed PT),OT (By licensed OT)     PT Frequency: 5x/week OT Frequency: 5x/week            Contractures Contractures Info: Not present    Additional Factors Info  Code Status,Allergies,Psychotropic Code Status Info: Fullcode Allergies Info: Allergies: No Known Allergies Psychotropic Info: Zyprexa and Zoloft         Current Medications (07/22/2020):  This is the current hospital active medication list Current Facility-Administered Medications  Medication Dose Route Frequency Provider Last Rate Last Admin  . 0.9 %  sodium chloride infusion   Intravenous PRN Florencia Reasons, MD   Stopped at 07/20/20 1300  . acetaminophen (TYLENOL) tablet 500 mg  500 mg Oral BID Eugenie Filler, MD   500 mg at 07/22/20 1027  . amLODipine (NORVASC) tablet 10 mg  10 mg Oral Daily Volanda Napoleon, MD   10 mg at 07/22/20 1027  . cloNIDine (CATAPRES) tablet 0.2 mg  0.2 mg Oral Daily Volanda Napoleon, MD   0.2 mg at 07/22/20 1027  . COVID-19 mRNA vaccine (Moderna) injection 0.5 mL  0.5 mL Intramuscular Once Florencia Reasons, MD      . Darbepoetin Alfa (ARANESP) injection 300 mcg  300 mcg Subcutaneous Once Volanda Napoleon, MD      . feeding supplement (ENSURE ENLIVE / ENSURE  PLUS) liquid 237 mL  237 mL Oral BID BM Starla Link, Kshitiz, MD   237 mL at 07/21/20 0826  . folic acid (FOLVITE) tablet 2 mg  2 mg Oral Daily Volanda Napoleon, MD   2 mg at 07/22/20 1027  . hydrocerin (EUCERIN) cream   Topical BID Eugenie Filler, MD   Given at 07/22/20 1028  . levETIRAcetam (KEPPRA) tablet 500 mg  500 mg Oral BID Aline August, MD   500 mg at 07/22/20 1027  . LORazepam (ATIVAN) injection 0.5 mg  0.5 mg Intravenous Q6H PRN Kathie Dike, MD      . morphine 2 MG/ML injection 1 mg  1 mg Intravenous Q2H PRN Starla Link, Kshitiz, MD      . OLANZapine (ZYPREXA)  tablet 2.5 mg  2.5 mg Oral QHS Alekh, Kshitiz, MD   2.5 mg at 07/21/20 2243  . oxyCODONE (Oxy IR/ROXICODONE) immediate release tablet 5 mg  5 mg Oral Q4H PRN Aline August, MD   5 mg at 07/18/20 0213  . pantoprazole (PROTONIX) EC tablet 40 mg  40 mg Oral Q0600 Eugenie Filler, MD   40 mg at 07/21/20 0544  . senna-docusate (Senokot-S) tablet 1 tablet  1 tablet Oral QHS Eugenie Filler, MD   1 tablet at 07/21/20 2243  . sertraline (ZOLOFT) tablet 100 mg  100 mg Oral Daily Aline August, MD   100 mg at 07/22/20 1026     Discharge Medications: Please see discharge summary for a list of discharge medications.  Relevant Imaging Results:  Relevant Lab Results:   Additional Information ssn:[id number]  Lia Hopping, LCSW

## 2020-07-22 NOTE — Progress Notes (Signed)
The hemoglobin is 6.7 now.  What I really like is the fact that the MCV is starting to come up.  I think this indicates that we are restoring her iron levels.  I do think that we can pull back on the Procrit.  We actually might be able to switch over to Aranesp.  This is longer acting.  We can probably go once a week with Aranesp.  I does need to make sure that she gets the proper dose.  Hopefully, she will be able to go to the skilled nursing facility now.  Her white cell count is 7.6.  Platelet count 196,000.  Her dementia is becoming a little more of an issue with changes in her emotions.  Again, I think we can get her out of the hospital, this might help her overall state of mind.  I feel optimistic that we are finally making some progress.  If we can get her to the office weekly for injections, this would certainly help the "cause."  I would also make sure that she gets iron weekly.  Lattie Haw, MD  [place] 1:16

## 2020-07-22 NOTE — TOC Progression Note (Signed)
Transition of Care [facility]) - Progression Note    Patient Details  Name: Shawna Lin MRN: [id number] Date of Birth: July 14, 1929  Transition of Care [facility]) CM/SW [place], LCSW Phone Number: 07/22/2020, 2:09 PM  Clinical Narrative:    CSW notified patient niece Shawna Lin cannot accept the patient due not being fully vaccinated.  Patient will need to be transported to [place] office for weekly injections. Shawna Lin Long-Dr. Jonette Lin)  CSW made referral to SNF's in the area.  Niece agreeable to [facility] in [place], [place]. The facility admission coordinator Shawna Lin confirm they will transport the patient to her appointments.  Niece Shawna Lin has requested the patient be vaccinated here. Physician and nurse to coordinate.  Covid test completed on 1/5. Facility is requesting another covid test Physician ordered.    Expected Discharge Plan: Skilled Nursing Facility Barriers to Discharge: Other (comment) (covid test pending)  Expected Discharge Plan and Services Expected Discharge Plan: [place] In-house Referral: Clinical Social Work Discharge Planning Services: CM Consult Post Acute Care Choice: [place] arrangements for the past 2 months: [place] (Memory Care) Expected Discharge Date: 07/22/20                                     Social Determinants of Health (SDOH) Interventions    Readmission Risk Interventions No flowsheet data found.

## 2020-07-23 LAB — CBC WITH DIFFERENTIAL/PLATELET
Abs Immature Granulocytes: 0.17 10*3/uL — ABNORMAL HIGH (ref 0.00–0.07)
Basophils Absolute: 0 10*3/uL (ref 0.0–0.1)
Basophils Relative: 1 %
Eosinophils Absolute: 0.2 10*3/uL (ref 0.0–0.5)
Eosinophils Relative: 2 %
HCT: 27.6 % — ABNORMAL LOW (ref 36.0–46.0)
Hemoglobin: 7.7 g/dL — ABNORMAL LOW (ref 12.0–15.0)
Immature Granulocytes: 2 %
Lymphocytes Relative: 27 %
Lymphs Abs: 2.1 10*3/uL (ref 0.7–4.0)
MCH: 25.2 pg — ABNORMAL LOW (ref 26.0–34.0)
MCHC: 27.9 g/dL — ABNORMAL LOW (ref 30.0–36.0)
MCV: 90.2 fL (ref 80.0–100.0)
Monocytes Absolute: 0.7 10*3/uL (ref 0.1–1.0)
Monocytes Relative: 9 %
Neutro Abs: 4.5 10*3/uL (ref 1.7–7.7)
Neutrophils Relative %: 59 %
Platelets: 223 10*3/uL (ref 150–400)
RBC: 3.06 MIL/uL — ABNORMAL LOW (ref 3.87–5.11)
RDW: 28.1 % — ABNORMAL HIGH (ref 11.5–15.5)
WBC: 7.6 10*3/uL (ref 4.0–10.5)
nRBC: 15.3 % — ABNORMAL HIGH (ref 0.0–0.2)

## 2020-07-23 LAB — COMPREHENSIVE METABOLIC PANEL
ALT: 24 U/L (ref 0–44)
AST: 36 U/L (ref 15–41)
Albumin: 4.2 g/dL (ref 3.5–5.0)
Alkaline Phosphatase: 135 U/L — ABNORMAL HIGH (ref 38–126)
Anion gap: 11 (ref 5–15)
BUN: 27 mg/dL — ABNORMAL HIGH (ref 8–23)
CO2: 26 mmol/L (ref 22–32)
Calcium: 10.6 mg/dL — ABNORMAL HIGH (ref 8.9–10.3)
Chloride: 104 mmol/L (ref 98–111)
Creatinine, Ser: 1.56 mg/dL — ABNORMAL HIGH (ref 0.44–1.00)
GFR, Estimated: 32 mL/min — ABNORMAL LOW (ref >60)
Glucose, Bld: 87 mg/dL (ref 70–99)
Potassium: 4 mmol/L (ref 3.5–5.1)
Sodium: 141 mmol/L (ref 135–145)
Total Bilirubin: 0.5 mg/dL (ref 0.3–1.2)
Total Protein: 6.8 g/dL (ref 6.5–8.1)

## 2020-07-23 LAB — RETICULOCYTES
Immature Retic Fract: 41 % — ABNORMAL HIGH (ref 2.3–15.9)
RBC.: 2.96 MIL/uL — ABNORMAL LOW (ref 3.87–5.11)
Retic Count, Absolute: 206.9 10*3/uL — ABNORMAL HIGH (ref 19.0–186.0)
Retic Ct Pct: 7 % — ABNORMAL HIGH (ref 0.4–3.1)

## 2020-07-23 NOTE — TOC Transition Note (Addendum)
Transition of Care [facility]) - CM/SW Discharge Note   Patient Details  Name: Shawna Lin MRN: [id number] Date of Birth: 05-31-1929  [facility]) CM/SW Contact:  Ross Ludwig, LCSW Phone Number: 07/23/2020, 11:25 AM   Clinical Narrative:     Patient to be d/c'ed today to [facility] [place] room 701.  Patient and family agreeable to plans will transport via ems RN to call report [phone].  CSW notified patient's niece [name] that patient is discharging today.  Palliative team recommended palliative follow patient outpatient at SNF.  CSW contacted [facility] to give referral for palliative to follow.   Final next level of care: [place] Barriers to Discharge: No Barriers Identified   Patient Goals and CMS Choice Patient states their goals for this hospitalization and ongoing recovery are:: Plan to go to SNF for short term rehab, then return back home. CMS Medicare.gov Compare Post Acute Care list provided to:: Patient Represenative (must comment) Choice offered to / list presented to : Adult [facility] / Guardian  Discharge Placement PASRR number recieved: 07/14/20 Existing PASRR number confirmed : 07/22/20          Patient chooses bed at: [facility] [place] Patient to be transferred to facility by: [facility] Name of family member notified: [name] Patient and family notified of of transfer: 07/23/20  Discharge Plan and Services In-house Referral: Clinical Social Work Discharge Planning Services: CM Consult Post Acute Care Choice: [place]                               Social Determinants of Health (SDOH) Interventions     Readmission Risk Interventions No flowsheet data found.

## 2020-07-23 NOTE — Progress Notes (Signed)
Patient is stable, uneventful overnight, she is stable to discharge to snf.

## 2020-07-23 NOTE — TOC Transition Note (Deleted)
Transition of Care [facility]) - CM/SW Discharge Note   Patient Details  Name: Shawna Lin MRN: [id number] Date of Birth: 05-28-1929  Transition of Care [facility]) CM/SW Contact:  Ross Ludwig, LCSW Phone Number: 07/23/2020, 11:11 AM   Clinical Narrative:    Patient to be d/c'ed today to [facility] [place] room 701.  Patient and family agreeable to plans will transport via ems RN to call report [phone].  CSW notified patient's niece [name] that patient is discharging today.   Final next level of care: [place] Barriers to Discharge: No Barriers Identified   Patient Goals and CMS Choice Patient states their goals for this hospitalization and ongoing recovery are:: Plan to go to SNF for short term rehab, then return back home. CMS Medicare.gov Compare Post Acute Care list provided to:: Patient Represenative (must comment) Choice offered to / list presented to : Adult [facility] / Guardian  Discharge Placement PASRR number recieved: 07/14/20            Patient chooses bed at: [facility] [place] Patient to be transferred to facility by: [place] Name of family member notified: Niece Carlyle Basques Patient and family notified of of transfer: 07/22/20  Discharge Plan and Services In-house Referral: Clinical Social Work Discharge Planning Services: CM Consult Post Acute Care Choice: [place]                               Social Determinants of Health (SDOH) Interventions     Readmission Risk Interventions No flowsheet data found.

## 2020-07-23 NOTE — Progress Notes (Signed)
As expected, the hemoglobin start to come up nicely.  The hemoglobin today is 7.7.  Her MCV is also coming up.  Is 1.  It sounds like she will be going to skilled nursing today.  We will see how we can do her treatments as an outpatient.  We should be able to get her in next week.  We will do Aranesp on her.  We will also do iron.  I am just glad that she is coming up nicely with her blood count.  She is not bleeding.  Her reticulocyte count is also improving.  This tells me that her bone marrow is responding well to the iron, V44, folic acid and the Aranesp.  I would keep her on folic acid at 2 mg a day.  I do not think that she will need any vitamin B-12.  Her blood pressure is doing a lot better.  I know that it was going up because of the ESA.  We will have to see about set her up as an outpatient.  My office is not at the [facility].  We need to make sure that the nursing facility knows this.  Lattie Haw, MD  Michaelyn Barter 6:9

## 2020-07-23 NOTE — Progress Notes (Signed)
Called report to [facility] transferred and put on hold for longer than 5 minutes with no answer. Bethann Punches RN

## 2020-07-23 NOTE — Progress Notes (Signed)
[facility]) Outpatient Palliative Care  Referral received for outpatient palliative care services.  ACC will follow up with patient and family at facility.  Venia Carbon RN, BSN, [facility] Liaison

## 2020-07-25 ENCOUNTER — Telehealth: Payer: Self-pay | Admitting: Hematology & Oncology

## 2020-07-25 NOTE — Telephone Encounter (Signed)
Spoke with niece regarding appointments scheduled per 1/10 staff message

## 2020-07-26 ENCOUNTER — Other Ambulatory Visit: Payer: Self-pay | Admitting: Family

## 2020-07-26 DIAGNOSIS — D509 Iron deficiency anemia, unspecified: Secondary | ICD-10-CM

## 2020-07-27 ENCOUNTER — Other Ambulatory Visit: Payer: Self-pay | Admitting: Hematology & Oncology

## 2020-07-28 ENCOUNTER — Other Ambulatory Visit: Payer: Self-pay | Admitting: Family

## 2020-07-28 DIAGNOSIS — D649 Anemia, unspecified: Secondary | ICD-10-CM

## 2020-07-29 ENCOUNTER — Ambulatory Visit: Payer: Medicare Other

## 2020-07-29 ENCOUNTER — Encounter: Payer: Self-pay | Admitting: Family

## 2020-07-29 ENCOUNTER — Other Ambulatory Visit: Payer: Self-pay

## 2020-07-29 ENCOUNTER — Inpatient Hospital Stay (HOSPITAL_BASED_OUTPATIENT_CLINIC_OR_DEPARTMENT_OTHER): Payer: Medicare Other | Admitting: Family

## 2020-07-29 ENCOUNTER — Telehealth: Payer: Self-pay

## 2020-07-29 ENCOUNTER — Inpatient Hospital Stay: Payer: Medicare Other | Attending: Family

## 2020-07-29 ENCOUNTER — Telehealth: Payer: Self-pay | Admitting: Family

## 2020-07-29 VITALS — BP 187/74 | HR 66 | Resp 20 | Wt 158.8 lb

## 2020-07-29 DIAGNOSIS — D509 Iron deficiency anemia, unspecified: Secondary | ICD-10-CM | POA: Diagnosis present

## 2020-07-29 DIAGNOSIS — D631 Anemia in chronic kidney disease: Secondary | ICD-10-CM | POA: Diagnosis not present

## 2020-07-29 DIAGNOSIS — Z79899 Other long term (current) drug therapy: Secondary | ICD-10-CM | POA: Insufficient documentation

## 2020-07-29 DIAGNOSIS — N183 Chronic kidney disease, stage 3 unspecified: Secondary | ICD-10-CM | POA: Diagnosis not present

## 2020-07-29 DIAGNOSIS — D649 Anemia, unspecified: Secondary | ICD-10-CM

## 2020-07-29 LAB — CMP (CANCER CENTER ONLY)
ALT: 14 U/L (ref 0–44)
AST: 23 U/L (ref 15–41)
Albumin: 4.4 g/dL (ref 3.5–5.0)
Alkaline Phosphatase: 135 U/L — ABNORMAL HIGH (ref 38–126)
Anion gap: 10 (ref 5–15)
BUN: 27 mg/dL — ABNORMAL HIGH (ref 8–23)
CO2: 27 mmol/L (ref 22–32)
Calcium: 10.6 mg/dL — ABNORMAL HIGH (ref 8.9–10.3)
Chloride: 109 mmol/L (ref 98–111)
Creatinine: 3.35 mg/dL (ref 0.44–1.00)
GFR, Estimated: 13 mL/min — ABNORMAL LOW (ref >60)
Glucose, Bld: 94 mg/dL (ref 70–99)
Potassium: 3.8 mmol/L (ref 3.5–5.1)
Sodium: 146 mmol/L — ABNORMAL HIGH (ref 135–145)
Total Bilirubin: 0.5 mg/dL (ref 0.3–1.2)
Total Protein: 6.7 g/dL (ref 6.5–8.1)

## 2020-07-29 LAB — CBC WITH DIFFERENTIAL (CANCER CENTER ONLY)
Abs Immature Granulocytes: 0.07 10*3/uL (ref 0.00–0.07)
Basophils Absolute: 0.1 10*3/uL (ref 0.0–0.1)
Basophils Relative: 1 %
Eosinophils Absolute: 0.1 10*3/uL (ref 0.0–0.5)
Eosinophils Relative: 2 %
HCT: 32.2 % — ABNORMAL LOW (ref 36.0–46.0)
Hemoglobin: 9 g/dL — ABNORMAL LOW (ref 12.0–15.0)
Immature Granulocytes: 1 %
Lymphocytes Relative: 27 %
Lymphs Abs: 1.3 10*3/uL (ref 0.7–4.0)
MCH: 24.9 pg — ABNORMAL LOW (ref 26.0–34.0)
MCHC: 28 g/dL — ABNORMAL LOW (ref 30.0–36.0)
MCV: 89 fL (ref 80.0–100.0)
Monocytes Absolute: 0.5 10*3/uL (ref 0.1–1.0)
Monocytes Relative: 11 %
Neutro Abs: 2.8 10*3/uL (ref 1.7–7.7)
Neutrophils Relative %: 58 %
Platelet Count: 188 10*3/uL (ref 150–400)
RBC: 3.62 MIL/uL — ABNORMAL LOW (ref 3.87–5.11)
RDW: 28.4 % — ABNORMAL HIGH (ref 11.5–15.5)
WBC Count: 4.9 10*3/uL (ref 4.0–10.5)
nRBC: 10.2 % — ABNORMAL HIGH (ref 0.0–0.2)

## 2020-07-29 LAB — RETICULOCYTES
Immature Retic Fract: 28.6 % — ABNORMAL HIGH (ref 2.3–15.9)
RBC.: 3.45 MIL/uL — ABNORMAL LOW (ref 3.87–5.11)
Retic Count, Absolute: 230 10*3/uL — ABNORMAL HIGH (ref 19.0–186.0)
Retic Ct Pct: 6.7 % — ABNORMAL HIGH (ref 0.4–3.1)

## 2020-07-29 LAB — SAMPLE TO BLOOD BANK

## 2020-07-29 LAB — IRON AND TIBC
Iron: 76 ug/dL (ref 41–142)
Saturation Ratios: 26 % (ref 21–57)
TIBC: 290 ug/dL (ref 236–444)
UIBC: 214 ug/dL (ref 120–384)

## 2020-07-29 LAB — FERRITIN: Ferritin: 1025 ng/mL — ABNORMAL HIGH (ref 11–307)

## 2020-07-29 LAB — SAVE SMEAR(SSMR), FOR PROVIDER SLIDE REVIEW

## 2020-07-29 LAB — LACTATE DEHYDROGENASE: LDH: 303 U/L — ABNORMAL HIGH (ref 98–192)

## 2020-07-29 NOTE — Progress Notes (Signed)
Hematology and Oncology Follow Up Visit  Shawna Lin [id number] Apr 13, 1929 87 y.o. 07/29/2020   Principle Diagnosis:  Anemia secondary to chronic kidney disease stage III Iron deficiency anemia   Current Therapy:   Aranesp 300 mcg SQ to maintain Hgb > 11 IV iron as indicated   Of Note: Patient is Jehovah's Witness - No blood products   Interim History:  Shawna Lin is here today for follow-up after hospital discharge. She had a fall at her living facility and sustained an L2 vertebral fracture. She was also found to be severely anemia at that times.  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 She has received Iv iron as well as Aranesp regularly and has had a nice response. Hgb at start was 5.4 and is now up to 9.0.  Hgb Creatinine is quite elevated today at 3.35. She was 1.56 at discharge from hospital.  She denies fluid retention or changes in bladder habits.  No fever, chills, n/v, cough, rash, dizziness, SOB, chest pain, palpitations, abdominal pain or changes in bowel habits.  No swelling, tenderness, numbness or tingling in her extremities at this time.  No new falls or syncope to report.  She states that her lower back pain is much better and is wearing a support brace today.  No blood loss noted. No abnormal bruising or petechiae.  She states that she has a very good appetite and is staying well hydrated. Her weight is stable at 158 lbs.   ECOG Performance Status: 2 - Symptomatic, <50% confined to bed  Medications:  Allergies as of 07/29/2020   No Known Allergies     Medication List       Accurate as of July 29, 2020  9:52 AM. If you have any questions, ask your nurse or doctor.        acetaminophen 325 MG tablet Commonly known as: TYLENOL Take 650 mg by mouth every 12 (twelve) hours as needed (knee pain).   amLODipine 10 MG tablet Commonly known as: NORVASC Take 1 tablet (10 mg total) by mouth daily.   cholecalciferol 25 MCG (1000 UNIT)  tablet Commonly known as: VITAMIN D3 Take 1,000 Units by mouth daily.   Ensure Take 237 mLs by mouth 3 (three) times daily.   eucerin lotion Apply 1 mL topically in the morning and at bedtime. Apply 1 application to lower extremities twice daily   folic acid 1 MG tablet Commonly known as: FOLVITE Take 2 tablets (2 mg total) by mouth daily.   furosemide 20 MG tablet Commonly known as: LASIX Take 1 tablet (20 mg total) by mouth daily as needed for fluid or edema.   levETIRAcetam 500 MG tablet Commonly known as: Keppra Take 1 tablet (500 mg total) by mouth 2 (two) times daily.   OLANZapine 2.5 MG tablet Commonly known as: ZYPREXA Take 2.5 mg by mouth at bedtime.   senna-docusate 8.6-50 MG tablet Commonly known as: Senokot-S Take 1 tablet by mouth at bedtime.   sertraline 100 MG tablet Commonly known as: ZOLOFT Take 100 mg by mouth daily.   vitamin B-12 1000 MCG tablet Commonly known as: CYANOCOBALAMIN Take 1 tablet (1,000 mcg total) by mouth daily.       Allergies: No Known Allergies  Past Medical History, Surgical history, Social history, and Family History were reviewed and updated.  Review of Systems: All other 10 point review of systems is negative.   Physical Exam:  vitals were not taken for this visit.  Wt Readings from Last 3 Encounters:  07/13/20 165 lb 12.6 oz (75.2 kg)  10/15/19 168 lb (76.2 kg)  08/28/19 171 lb (77.6 kg)    Ocular: Sclerae unicteric, pupils equal, round and reactive to light Ear-nose-throat: Oropharynx clear, dentition fair Lymphatic: No cervical or supraclavicular adenopathy Lungs no rales or rhonchi, good excursion bilaterally Heart regular rate and rhythm, no murmur appreciated Abd soft, nontender, positive bowel sounds MSK no focal spinal tenderness, no joint edema Neuro: non-focal, well-oriented, appropriate affect Breasts: Deferred   Lab Results  Component Value Date   WBC 7.6 07/23/2020   HGB 7.7 (L) 07/23/2020    HCT 27.6 (L) 07/23/2020   MCV 90.2 07/23/2020   PLT 223 07/23/2020   Lab Results  Component Value Date   FERRITIN 32 07/14/2020   IRON 34 07/13/2020   TIBC 407 07/13/2020   UIBC 373 07/13/2020   IRONPCTSAT 8 (L) 07/13/2020   Lab Results  Component Value Date   RETICCTPCT 7.0 (H) 07/23/2020   RBC 3.06 (L) 07/23/2020   RBC 2.96 (L) 07/23/2020   No results found for: KPAFRELGTCHN, LAMBDASER, KAPLAMBRATIO No results found for: IGGSERUM, IGA, IGMSERUM No results found for: Odetta Pink, SPEI   Chemistry      Component Value Date/Time   NA 141 07/23/2020 0815   K 4.0 07/23/2020 0815   CL 104 07/23/2020 0815   CO2 26 07/23/2020 0815   BUN 27 (H) 07/23/2020 0815   CREATININE 1.56 (H) 07/23/2020 0815      Component Value Date/Time   CALCIUM 10.6 (H) 07/23/2020 0815   ALKPHOS 135 (H) 07/23/2020 0815   AST 36 07/23/2020 0815   ALT 24 07/23/2020 0815   BILITOT 0.5 07/23/2020 0815       Impression and Plan: Shawna Lin is a very pleasant 87 yo African American female with multifactorial anemia.  No Aranesp given today due to elevated BP at 187/74.  I did recommend on her forms that she follow-up with her PCP regarding the elevated Creatinine. She states that she does not see a kidney specialist.  Labs were sent with paperwork from facility.  Iron studies are pending. We will replace if needed.  Follow-up in 3 weeks.  They were encouraged to contact our office with any questions or concerns.   [name], NP 1/14/20229:52 AM

## 2020-07-29 NOTE — Telephone Encounter (Signed)
I was able to speak with Ms. Thibodaux's niece and update her on today's visit as well as lab work and recommendations for PCP follow-up regarding Creatinine level and elevated BP. All questions were answered and she was appreciative of the call.

## 2020-07-29 NOTE — Telephone Encounter (Signed)
appts made and printed per 07/29/20 los   aom

## 2020-08-02 ENCOUNTER — Other Ambulatory Visit: Payer: Self-pay

## 2020-08-02 ENCOUNTER — Non-Acute Institutional Stay: Payer: Medicare Other | Admitting: Primary Care

## 2020-08-02 LAB — HGB FRACTIONATION CASCADE
Hgb A2: 1.9 % (ref 1.8–3.2)
Hgb A: 98.1 % (ref 96.4–98.8)
Hgb F: 0 % (ref 0.0–2.0)
Hgb S: 0 %

## 2020-08-02 LAB — ERYTHROPOIETIN: Erythropoietin: 446.3 m[IU]/mL — ABNORMAL HIGH (ref 2.6–18.5)

## 2020-08-03 ENCOUNTER — Non-Acute Institutional Stay: Payer: Medicare Other | Admitting: Primary Care

## 2020-08-03 ENCOUNTER — Other Ambulatory Visit: Payer: Self-pay

## 2020-08-03 DIAGNOSIS — F0390 Unspecified dementia without behavioral disturbance: Secondary | ICD-10-CM

## 2020-08-03 DIAGNOSIS — N183 Chronic kidney disease, stage 3 unspecified: Secondary | ICD-10-CM

## 2020-08-03 NOTE — Progress Notes (Signed)
[facility] Palliative Care Consult Note Telephone: [phone]  Fax: [phone]     Date of encounter: 08/03/20 PATIENT NAME: Shawna [facility] [address] [place] [address] [phone] (home)  DOB: 1929-05-27 MRN: [id number]  PRIMARY CARE PROVIDER:    [name]., MD [address] [place] [address] [phone]  REFERRING PROVIDER:   [name]., MD [place]  [id number] [phone]  RESPONSIBLE PARTY:   Extended Emergency Contact Information Primary Emergency Contact: Shawna Lin Mobile Phone: [phone] Relation: Niece Secondary Emergency Contact: brown, paulette Mobile Phone: [phone] Relation: Niece Interpreter needed? No  I met face to face with patient in facility. Palliative Care was asked to follow this patient by consultation request of Hester, Ronnette Hila., MD to help address advance care planning and goals of care. This is the initial visit.   ASSESSMENT AND RECOMMENDATIONS:   1. Advance Care Planning/Goals of Care: Goals include to maximize quality of life and symptom management. Our advance care planning conversation included a discussion about:     Exploration of personal, cultural or spiritual beliefs that might influence medical decisions   Identification and preparation of a healthcare agent - [name] Reached POA Shawna Lin who had no concerns. They will f/u with finding glasses (may be at ALF) and dentures, or getting new appts. The plan is for her to gain strength and return her her ALF.  2. Symptom Management:   I met with Shawna Lin in her skilled facility room. She was in a chair after breakfast. She states she feels pretty good but that she needs help walking and that her glasses have been lost. I will reach out to family but recommend that she has her glasses to decrease risk of falls into increase the productivity of a rehab by decreasing sensory deprivation. Staff  endorses that she eats about 50 to 75% of food.   Weights not available  at the snf but historically 165 lbs, now 158 lbs. She denies any pain or discomfort. Please offer supplements as this is a 4%  Loss.  Anemia has been noted with hbg down to 6.7, 6.1 in past 2 weeks. Pt is however a Jehovah Witness and does not take transfusions.  3. Follow up Palliative Care Visit: Palliative care will continue to follow for goals of care clarification and symptom management. Return 4 weeks or prn.  4. Family /Caregiver/Community Supports:  Niece is POA, has been living in ALF. Plans to return.  5. Cognitive / Functional decline: A and O x 1, dependent in most adls.  I spent 35 minutes providing this consultation,  from 0930 to 1005. More than 50% of the time in this consultation was spent coordinating communication.   CODE STATUS: TBD  PPS: 30%  HOSPICE ELIGIBILITY/DIAGNOSIS: TBD  Subjective:  CHIEF COMPLAINT: immobility  HISTORY OF PRESENT ILLNESS:  Shawna Lin is a 87 y.o. year old female  with h/o living in ALF,   Had fall and injury of L 2 (fracture) 07/12/20. Has binder for pain. Has h/o seizure disorder, syncope, osteopenia, dementia, CKD 3 and anemia. Fell at ALF and sustained fx of L2. Now at [facility] for rehab. Has lumbar  binder for pain and stability.  We are asked to consult around advance care planning and complex medical decision making.    Review and summarization of old Epic records shows or history from other than patient. Review or lab tests, radiology,  or medicine-  CBC, hgb, review of CT of spine 12/28 Review of case with family member Shawna Lin.  History obtained from review of EMR, discussion with primary team, and  interview with family, caregiver  and/or Shawna Lin. Records reviewed and summarized above.   CURRENT PROBLEM LIST:  Patient Active Problem List   Diagnosis Date Noted  . IDA (iron deficiency anemia) 07/26/2020  . Palliative care by specialist   . Goals of  care, counseling/discussion   . General weakness   . L2 vertebral fracture ([place]) 07/12/2020  . Anemia 07/12/2020  . Fall 07/12/2020  . CKD (chronic kidney disease), stage III ([place]) 07/12/2020  . AKI (acute kidney injury) ([place]) 07/12/2020  . Syncope 08/26/2019  . Recurrent syncope 08/25/2019  . QT prolongation 08/25/2019  . Seizure disorder ([place]) 08/25/2019  . Pain due to onychomycosis of toenails of both feet 01/07/2019  . Dementia ([place]) 12/21/2015  . No blood products 12/21/2015  . 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 Memory loss 09/02/2015  . HTN (hypertension) 09/02/2015   PAST MEDICAL HISTORY:  Active Ambulatory Problems    Diagnosis Date Noted  . Memory loss 09/02/2015  . HTN (hypertension) 09/02/2015  . Dementia ([place]) 12/21/2015  . No blood products 12/21/2015  . 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 Pain due to onychomycosis of toenails of both feet 01/07/2019  . Recurrent syncope 08/25/2019  . QT prolongation 08/25/2019  . Seizure disorder ([place]) 08/25/2019  . Syncope 08/26/2019  . L2 vertebral fracture ([place]) 07/12/2020  . Anemia 07/12/2020  . Fall 07/12/2020  . CKD (chronic kidney disease), stage III ([place]) 07/12/2020  . AKI (acute kidney injury) ([place]) 07/12/2020  . Palliative care by specialist   . Goals of care, counseling/discussion   . General weakness   . IDA (iron deficiency anemia) 07/26/2020   Resolved Ambulatory Problems    Diagnosis Date Noted  . No Resolved Ambulatory Problems   Past Medical History:  Diagnosis Date  . Hypertension   . Iron deficiency anemia   . Vitamin D deficiency    SOCIAL HX:  Social History   Tobacco Use  . Smoking status: Never Smoker  . Smokeless tobacco: Never Used  Substance Use Topics  . Alcohol use: No    Alcohol/week: 0.0 standard drinks   FAMILY HX:  Family History  Problem Relation Age of Onset  . Heart attack Father   . Hypertension Mother   . Colon cancer Brother   . Hypertension Brother   . Diabetes Sister   .  Kidney failure Sister   . Hypertension Sister   . Dementia Neg Hx       ALLERGIES: No Known Allergies   PERTINENT MEDICATIONS:  Outpatient Encounter Medications as of 08/03/2020  Medication Sig  . acetaminophen (TYLENOL) 325 MG tablet Take 650 mg by mouth every 12 (twelve) hours as needed (knee pain).   . ADULT ASPIRIN LOW STRENGTH PO Take by mouth.  Marland Kitchen albuterol (ACCUNEB) 0.63 MG/3ML nebulizer solution Take 1 ampule by nebulization every 6 (six) hours as needed for Wheezing.  Marland Kitchen amLODipine (NORVASC) 10 MG tablet Take 1 tablet (10 mg total) by mouth daily.  . cholecalciferol (VITAMIN D3) 25 MCG (1000 UNIT) tablet Take 1,000 Units by mouth daily.  . Emollient (EUCERIN) lotion Apply 1 mL topically in the morning and at bedtime. Apply 1 application to lower extremities twice daily  . Ensure (ENSURE) Take 237 mLs by mouth 3 (three) times daily.  . folic acid (FOLVITE) 1 MG tablet Take 2 tablets (2  mg total) by mouth daily.  . furosemide (LASIX) 20 MG tablet Take 1 tablet (20 mg total) by mouth daily as needed for fluid or edema.  . levETIRAcetam (KEPPRA) 500 MG tablet Take 1 tablet (500 mg total) by mouth 2 (two) times daily.  Marland Kitchen OLANZapine (ZYPREXA) 2.5 MG tablet Take 2.5 mg by mouth at bedtime.  . senna-docusate (SENOKOT-S) 8.6-50 MG tablet Take 1 tablet by mouth at bedtime.  . sertraline (ZOLOFT) 100 MG tablet Take 100 mg by mouth daily.  . vitamin B-12 (CYANOCOBALAMIN) 1000 MCG tablet Take 1 tablet (1,000 mcg total) by mouth daily.   No facility-administered encounter medications on file as of 08/03/2020.    Objective: ROS  General: NAD EYES: denies vision changes, wears glasses, does not have  ENMT: denies dysphagia Cardiovascular: denies chest pain Pulmonary:  Denies cough,  increased SOB Abdomen: endorses good appetite, denies constipation, endorses incontinence of bowel at times  GU: denies dysuria, endorses incontinence of urine at times MSK:  endorses ROM limitations, no falls  reported at SNF, x 1 at ALF 3 weeks ago. Skin: denies rashes or wounds Neurological: endorses weakness, denies pain, denies insomnia Psych: Endorses positive mood Heme/lymph/immuno: denies bruises, abnormal bleeding  Physical Exam: Current and past weights:158 lbs Constitutional: NAD General: frail appearing, WNWD EYES: anicteric sclera,lids intact, no discharge  ENMT: intact hearing,oral mucous membranes moist, missing upper, implants in lower mandible. CV: S1S2, murmur, RRR, 1+LE edema Pulmonary: LCTA, no increased work of breathing, no cough Abdomen: intake 50%,no ascites GU: deferred MSK: moderate  sarcopenia, decreased ROM in all extremities, no contractures of LE,  In w/c. Skin: warm and dry, no rashes or wounds on visible skin Neuro: Generalized weakness, severe cognitive impairment Psych: non-anxious affect, A and O x 1 Hem/lymph/immuno: no widespread bruising   Thank you for the opportunity to participate in the care of Shawna. Pundt.  The palliative care team will continue to follow. Please call our office at [phone] if we can be of additional assistance.  Jason Coop, NP , DNP, MPH, AGPCNP-BC, ACHPN  COVID-19 PATIENT SCREENING TOOL  Person answering questions: ____________staff______ _____   1.  Is the patient or any family member in the home showing any signs or symptoms regarding respiratory infection?               Person with Symptom- __________NA_________________  a. Fever                                                                          Yes___ No___          ___________________  b. Shortness of breath                                                    Yes___ No___          ___________________ c. Cough/congestion  Yes___  No___         ___________________ d. Body aches/pains                                                         Yes___ No___        ____________________ e. Gastrointestinal symptoms  (diarrhea, nausea)           Yes___ No___        ____________________  2. Within the past 14 days, has anyone living in the home had any contact with someone with or under investigation for COVID-19?    Yes___ No_X_   Person __________________

## 2020-08-05 LAB — ALPHA-THALASSEMIA GENOTYPR

## 2020-08-15 ENCOUNTER — Telehealth: Payer: Self-pay | Admitting: Hematology & Oncology

## 2020-08-15 ENCOUNTER — Other Ambulatory Visit: Payer: Self-pay

## 2020-08-15 DIAGNOSIS — N184 Chronic kidney disease, stage 4 (severe): Secondary | ICD-10-CM | POA: Insufficient documentation

## 2020-08-15 DIAGNOSIS — R944 Abnormal results of kidney function studies: Secondary | ICD-10-CM | POA: Diagnosis present

## 2020-08-15 DIAGNOSIS — E860 Dehydration: Secondary | ICD-10-CM | POA: Insufficient documentation

## 2020-08-15 DIAGNOSIS — F0390 Unspecified dementia without behavioral disturbance: Secondary | ICD-10-CM | POA: Insufficient documentation

## 2020-08-15 DIAGNOSIS — Z79899 Other long term (current) drug therapy: Secondary | ICD-10-CM | POA: Diagnosis not present

## 2020-08-15 DIAGNOSIS — I129 Hypertensive chronic kidney disease with stage 1 through stage 4 chronic kidney disease, or unspecified chronic kidney disease: Secondary | ICD-10-CM | POA: Insufficient documentation

## 2020-08-15 LAB — CBC
HCT: 36.4 % (ref 36.0–46.0)
Hemoglobin: 10.8 g/dL — ABNORMAL LOW (ref 12.0–15.0)
MCH: 25.1 pg — ABNORMAL LOW (ref 26.0–34.0)
MCHC: 29.7 g/dL — ABNORMAL LOW (ref 30.0–36.0)
MCV: 84.5 fL (ref 80.0–100.0)
Platelets: 106 10*3/uL — ABNORMAL LOW (ref 150–400)
RBC: 4.31 MIL/uL (ref 3.87–5.11)
RDW: 23.1 % — ABNORMAL HIGH (ref 11.5–15.5)
WBC: 5 10*3/uL (ref 4.0–10.5)
nRBC: 0.4 % — ABNORMAL HIGH (ref 0.0–0.2)

## 2020-08-15 LAB — COMPREHENSIVE METABOLIC PANEL
ALT: 9 U/L (ref 0–44)
AST: 22 U/L (ref 15–41)
Albumin: 4.1 g/dL (ref 3.5–5.0)
Alkaline Phosphatase: 108 U/L (ref 38–126)
Anion gap: 16 — ABNORMAL HIGH (ref 5–15)
BUN: 44 mg/dL — ABNORMAL HIGH (ref 8–23)
CO2: 19 mmol/L — ABNORMAL LOW (ref 22–32)
Calcium: 10.6 mg/dL — ABNORMAL HIGH (ref 8.9–10.3)
Chloride: 110 mmol/L (ref 98–111)
Creatinine, Ser: 3.7 mg/dL — ABNORMAL HIGH (ref 0.44–1.00)
GFR, Estimated: 11 mL/min — ABNORMAL LOW (ref >60)
Glucose, Bld: 102 mg/dL — ABNORMAL HIGH (ref 70–99)
Potassium: 3.6 mmol/L (ref 3.5–5.1)
Sodium: 145 mmol/L (ref 135–145)
Total Bilirubin: 0.7 mg/dL (ref 0.3–1.2)
Total Protein: 6.8 g/dL (ref 6.5–8.1)

## 2020-08-15 NOTE — ED Triage Notes (Signed)
Pt here by [facility] from [facility] resources for abnormal creatinine level that was done today. Pt has hx of dementia but staff was unable to tell EMS if her mentation is normal at this time. Pt alert to self only unable to tell me why she is here. States she is not in pain and has no complaints at this time.

## 2020-08-15 NOTE — Telephone Encounter (Signed)
I called and advised niece of 1/31 scheduling message.  She understood the message .  She stated that her Elenor Legato was currently in a [place] at present and will call us back to reschedule her appointments from 2/4 w/ Sarah C.

## 2020-08-16 ENCOUNTER — Emergency Department
Admission: EM | Admit: 2020-08-16 | Discharge: 2020-08-16 | Disposition: A | Discharge status: Home / Self Care | Payer: Medicare Other | Attending: Emergency Medicine | Admitting: Emergency Medicine

## 2020-08-16 ENCOUNTER — Emergency Department: Payer: Medicare Other

## 2020-08-16 DIAGNOSIS — F0390 Unspecified dementia without behavioral disturbance: Secondary | ICD-10-CM

## 2020-08-16 DIAGNOSIS — N184 Chronic kidney disease, stage 4 (severe): Secondary | ICD-10-CM

## 2020-08-16 DIAGNOSIS — E860 Dehydration: Secondary | ICD-10-CM

## 2020-08-16 LAB — TROPONIN I (HIGH SENSITIVITY): Troponin I (High Sensitivity): 34 ng/L — ABNORMAL HIGH (ref <18)

## 2020-08-16 MED ORDER — SODIUM CHLORIDE 0.9 % IV BOLUS
1000.0000 mL | Freq: Once | INTRAVENOUS | Status: AC
  Administered 2020-08-16: 1000 mL via INTRAVENOUS

## 2020-08-16 NOTE — ED Notes (Signed)
Called legal guardian, Carlyle Basques, back. She understands reason for discharge and agrees. Called [facility] at [phone]. Spoke with Faith. Informed her that will be discharged back to them.

## 2020-08-16 NOTE — ED Notes (Addendum)
Called legal guardian, Carlyle Basques, at [phone]  to discuss discharge back to [facility]. She expressed dissatisfaction with the plan to have pt discharged after IV rehydration to correct AKI because "they have been rehydrating her for a week" at [facility] and pt still has elevated CR level. Told her will have EDP discuss rationale for discharge with her as she was not comfortable with the explanation provided by this nurse.

## 2020-08-16 NOTE — ED Notes (Signed)
Pt unable to sign for discharge due to advanced dementia.

## 2020-08-16 NOTE — ED Provider Notes (Signed)
[facility] Emergency Department Provider Note  ____________________________________________  Time seen: Approximately 6:01 AM  I have reviewed the triage vital signs and the nursing notes.   HISTORY  Chief Complaint Abnormal Lab    Level 5 Caveat: Portions of the History and Physical including HPI and review of systems are unable to be completely obtained due to patient dementia  HPI Shawna Lin is a 87 y.o. female with a history of hypertension, dementia, CKD who was sent to the ED from [facility] due to elevated creatinine level.  No further history is available, no acute symptoms related.  Patient states that she feels fine.   Denies any focal pain.     Past Medical History:  Diagnosis Date  . Hypertension   . Iron deficiency anemia   . Memory loss   . Vitamin D deficiency      Patient Active Problem List   Diagnosis Date Noted  . IDA (iron deficiency anemia) 07/26/2020  . Palliative care by specialist   . Goals of care, counseling/discussion   . General weakness   . L2 vertebral fracture ([place]) 07/12/2020  . Anemia 07/12/2020  . Fall 07/12/2020  . CKD (chronic kidney disease), stage III ([place]) 07/12/2020  . AKI (acute kidney injury) ([place]) 07/12/2020  . Syncope 08/26/2019  . Recurrent syncope 08/25/2019  . QT prolongation 08/25/2019  . Seizure disorder ([place]) 08/25/2019  . Pain due to onychomycosis of toenails of both feet 01/07/2019  . Dementia ([place]) 12/21/2015  . No blood products 12/21/2015  . 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 Memory loss 09/02/2015  . HTN (hypertension) 09/02/2015     Past Surgical History:  Procedure Laterality Date  . UTERINE FIBROID SURGERY       Prior to Admission medications   Medication Sig Start Date End Date Taking? Authorizing Provider  acetaminophen (TYLENOL) 325 MG tablet Take 650 mg by mouth every 12 (twelve) hours as needed (knee pain).     [provider]  ADULT ASPIRIN LOW STRENGTH  PO Take by mouth.    [provider]  albuterol (ACCUNEB) 0.63 MG/3ML nebulizer solution Take 1 ampule by nebulization every 6 (six) hours as needed for Wheezing.    [provider]  amLODipine (NORVASC) 10 MG tablet Take 1 tablet (10 mg total) by mouth daily. 07/23/20   Florencia Reasons, MD  cholecalciferol (VITAMIN D3) 25 MCG (1000 UNIT) tablet Take 1,000 Units by mouth daily.    [provider]  Emollient (EUCERIN) lotion Apply 1 mL topically in the morning and at bedtime. Apply 1 application to lower extremities twice daily    [provider]  Ensure (ENSURE) Take 237 mLs by mouth 3 (three) times daily.    [provider]  folic acid (FOLVITE) 1 MG tablet Take 2 tablets (2 mg total) by mouth daily. 07/23/20   Florencia Reasons, MD  furosemide (LASIX) 20 MG tablet Take 1 tablet (20 mg total) by mouth daily as needed for fluid or edema. 07/22/20   Florencia Reasons, MD  levETIRAcetam (KEPPRA) 500 MG tablet Take 1 tablet (500 mg total) by mouth 2 (two) times daily. 10/15/19   Melvenia Beam, MD  OLANZapine (ZYPREXA) 2.5 MG tablet Take 2.5 mg by mouth at bedtime.    [provider]  senna-docusate (SENOKOT-S) 8.6-50 MG tablet Take 1 tablet by mouth at bedtime. 07/22/20   Florencia Reasons, MD  sertraline (ZOLOFT) 100 MG tablet Take 100 mg by mouth daily.    [provider]  vitamin B-12 (CYANOCOBALAMIN) 1000 MCG tablet Take 1 tablet (1,000 mcg total) by mouth daily. 07/22/20   Florencia Reasons, MD     Allergies Patient has no known allergies.   Family History  Problem Relation Age of Onset  . Heart attack Father   . Hypertension Mother   . Colon cancer Brother   . Hypertension Brother   . Diabetes Sister   . Kidney failure Sister   . Hypertension Sister   . Dementia Neg Hx     Social History Social History   Tobacco Use  . Smoking status: Never Smoker  . Smokeless tobacco: Never Used  Vaping Use  . Vaping Use: Never used  Substance Use Topics  . Alcohol use: No     Alcohol/week: 0.0 standard drinks  . Drug use: No    Review of Systems Level 5 Caveat: Portions of the History and Physical including HPI and review of systems are unable to be completely obtained due to patient being a poor historian   Constitutional:   No known fever.  ENT:   No rhinorrhea. Cardiovascular:   No chest pain or syncope. Respiratory:   No dyspnea or cough. Gastrointestinal:   Negative for abdominal pain, vomiting and diarrhea.  Musculoskeletal:   Negative for focal pain or swelling ____________________________________________   PHYSICAL EXAM:  VITAL SIGNS: ED Triage Vitals [08/15/20 2234]  Enc Vitals Group     BP (!) 181/89     Pulse Rate 64     Resp 18     Temp (!) 96.6 F (35.9 C)     Temp Source Oral     SpO2 99 %     Weight 158 lb 11.7 oz (72 kg)     Height      Head Circumference      [facility]      Pain Score 0     Pain Loc      Pain Edu?      Excl. in [place]?     Vital signs reviewed, nursing assessments reviewed.   Constitutional:   Awake and alert, not oriented.. Non-toxic appearance. Eyes:   Conjunctivae are normal. EOMI. PERRL. ENT      Head:   Normocephalic and atraumatic.      Nose:   No congestion/rhinnorhea.       Mouth/Throat:   Dry mucous membranes, no pharyngeal erythema. No peritonsillar mass.       Neck:   No meningismus. Full ROM. Hematological/Lymphatic/Immunilogical:   No cervical lymphadenopathy. Cardiovascular:   RRR. Symmetric bilateral radial and DP pulses.  No murmurs. Cap refill less than 2 seconds. Respiratory:   Normal respiratory effort without tachypnea/retractions. Breath sounds are clear and equal bilaterally. No wheezes/rales/rhonchi. Gastrointestinal:   Soft and nontender. Non distended. There is no CVA tenderness.  No rebound, rigidity, or guarding. Musculoskeletal:   Normal range of motion in all extremities. No joint effusions.  No lower extremity tenderness.  No edema. Neurologic:   Very limited language  expression.  Motor grossly intact. No acute focal neurologic deficits are appreciated.  Skin:    Skin is warm, dry and intact. No rash noted.  No petechiae, purpura, or bullae.  ____________________________________________    LABS (pertinent positives/negatives) (all labs ordered are listed, but only abnormal results are displayed) Labs Reviewed  CBC - Abnormal; Notable for the following components:      Result Value   Hemoglobin 10.8 (*)    MCH 25.1 (*)    MCHC  29.7 (*)    RDW 23.1 (*)    Platelets 106 (*)    nRBC 0.4 (*)    All other components within normal limits  COMPREHENSIVE METABOLIC PANEL - Abnormal; Notable for the following components:   CO2 19 (*)    Glucose, Bld 102 (*)    BUN 44 (*)    Creatinine, Ser 3.70 (*)    Calcium 10.6 (*)    GFR, Estimated 11 (*)    Anion gap 16 (*)    All other components within normal limits  TROPONIN I (HIGH SENSITIVITY) - Abnormal; Notable for the following components:   Troponin I (High Sensitivity) 34 (*)    All other components within normal limits  URINALYSIS, COMPLETE (UACMP) WITH MICROSCOPIC   ____________________________________________   EKG    ____________________________________________    RADIOLOGY  CT Renal Stone Study  Result Date: 08/16/2020 CLINICAL DATA:  Acute renal failure EXAM: CT ABDOMEN AND PELVIS WITHOUT CONTRAST TECHNIQUE: Multidetector CT imaging of the abdomen and pelvis was performed following the standard protocol without IV contrast. COMPARISON:  None. FINDINGS: Lower chest: No acute finding. Coronary atherosclerosis. Mild scarring in the right lower lobe. Hepatobiliary: Simple central hepatic cyst.High-density in the dependent gallbladder could be calculi or sludge. No evidence of cholecystitis. No bile duct dilatation. Pancreas: Unremarkable. Spleen: Unremarkable. Adrenals/Urinary Tract: Negative adrenals. No hydronephrosis or stone. Unremarkable bladder. Stomach/Bowel:  No obstruction. No  appendicitis. Vascular/Lymphatic: No acute vascular abnormality. Diffuse atheromatous calcification. No mass or adenopathy. Reproductive:Hysterectomy. Other: No ascites or pneumoperitoneum. Musculoskeletal: No acute abnormalities. Small simple lipoma in the lower left pectoralis musculature. Severe spinal degeneration. There are chronic L5 pars defects at L5-S1 with anterolisthesis and ankylosis. Advanced foraminal impingement is seen bilaterally at L3-4 and on the right at L4-5. Nonacute L1 superior endplate fracture with moderate height loss that is similar to CT 07/12/2020. Scarring affecting the rectus abdominal muscles, greater on the right. IMPRESSION: 1. Normal appearance of the kidneys and bladder. 2. Probable gallbladder sludge or calculi. Negative for cholecystitis. 3. Aortic Atherosclerosis (ICD10-I70.0). Electronically Signed   By: Monte Fantasia M.D.   On: 08/16/2020 05:49    ____________________________________________   PROCEDURES Procedures  ____________________________________________    CLINICAL IMPRESSION / ASSESSMENT AND PLAN / ED COURSE  Medications ordered in the ED: Medications  sodium chloride 0.9 % bolus 1,000 mL (has no administration in time range)    Pertinent labs & imaging results that were available during my care of the patient were reviewed by me and considered in my medical decision making (see chart for details).   Shawna Lin was evaluated in Emergency Department on 08/16/2020 for the symptoms described in the history of present illness. She was evaluated in the context of the global COVID-19 pandemic, which necessitated consideration that the patient might be at risk for infection with the SARS-CoV-2 virus that causes COVID-19. Institutional protocols and algorithms that pertain to the evaluation of patients at risk for COVID-19 are in a state of rapid change based on information released by regulatory bodies including the CDC and federal and state  organizations. These policies and algorithms were followed during the patient's care in the ED.   Patient sent to ED for abnormal creatinine level.  Previous baseline appears to be about 1.5.  On January 14 it was 3.3.  On January 19 it was 2.3.  Today is 3.7.  Clinically she appears dehydrated.  Will give IV fluids.  This is less of an acute change and more of a  subacute issue which has shown itself to be stable over the past several weeks.  No focal complaints, exam is nonfocal otherwise.  Vital signs unremarkable, she will be stable for outpatient follow-up with primary care and nephrology.      ____________________________________________   FINAL CLINICAL IMPRESSION(S) / ED DIAGNOSES    Final diagnoses:  CKD (chronic kidney disease) stage 4, GFR 15-29 ml/min ([place])  Dehydration     ED Discharge Orders    None      Portions of this note were generated with dragon dictation software. Dictation errors may occur despite best attempts at proofreading.   Carrie Mew, MD 08/16/20 [phone]

## 2020-08-16 NOTE — ED Notes (Signed)
Dr Quentin Cornwall speaking on phone with legal guardian listed in last note.

## 2020-08-16 NOTE — ED Notes (Signed)
[facility]  CALLED  FOR  TRANSPORT  TO  [facility] 

## 2020-08-16 NOTE — ED Notes (Signed)
TRX Medical necessity and other discharge paperwork handed to ED secretary.

## 2020-08-16 NOTE — Discharge Instructions (Addendum)
Stop taking lasix (Furosemide) until re-evaluated by PCP and/or Neprhology.  Please call to make an appointment to follow up with nephrology.

## 2020-08-18 ENCOUNTER — Telehealth: Payer: Self-pay

## 2020-08-18 NOTE — Telephone Encounter (Signed)
Per pts niece the correct number to call and r/s her appt due to sarah being out of the office is [phone] for Shawna Lin, I left her a vm with the newappt    Covey Baller

## 2020-08-19 ENCOUNTER — Ambulatory Visit: Payer: Medicare Other

## 2020-08-19 ENCOUNTER — Ambulatory Visit: Payer: Medicare Other | Admitting: Family

## 2020-08-19 ENCOUNTER — Other Ambulatory Visit: Payer: Medicare Other

## 2020-08-26 ENCOUNTER — Non-Acute Institutional Stay: Payer: Medicare Other | Admitting: Primary Care

## 2020-08-26 ENCOUNTER — Other Ambulatory Visit: Payer: Self-pay

## 2020-08-29 ENCOUNTER — Non-Acute Institutional Stay: Payer: Medicare Other | Admitting: Primary Care

## 2020-08-29 ENCOUNTER — Other Ambulatory Visit: Payer: Self-pay

## 2020-08-29 DIAGNOSIS — N183 Chronic kidney disease, stage 3 unspecified: Secondary | ICD-10-CM

## 2020-08-29 DIAGNOSIS — Z515 Encounter for palliative care: Secondary | ICD-10-CM

## 2020-08-29 DIAGNOSIS — F0390 Unspecified dementia without behavioral disturbance: Secondary | ICD-10-CM

## 2020-08-29 DIAGNOSIS — G40909 Epilepsy, unspecified, not intractable, without status epilepticus: Secondary | ICD-10-CM

## 2020-08-29 DIAGNOSIS — R9431 Abnormal electrocardiogram [ECG] [EKG]: Secondary | ICD-10-CM

## 2020-08-29 NOTE — Progress Notes (Signed)
[facility] Palliative Care Consult Note Telephone: [phone]  Fax: [phone]    Date of encounter: 08/29/20 PATIENT NAME: Shawna Lin [facility] [address] [place] [address] [phone] (home)  DOB: [birth year] MRN: [id number]  PRIMARY CARE PROVIDER:    [name]., MD,  [address] [place] [address] [phone]  REFERRING PROVIDER:   [name]., MD [address] [place],  [place] [address] [phone]  RESPONSIBLE PARTY:   Extended Emergency Contact Information Primary Emergency Contact: Carlyle Basques Mobile Phone: [phone] Relation: Niece Secondary Emergency Contact: brown, paulette Mobile Phone: [phone] Relation: Niece Interpreter needed? No  I met face to face with patient and family in facility. Palliative Care was asked to follow this patient by consultation request of Hester, Ronnette Hila., MD to help address advance care planning and goals of care. This is a follow up  visit.   ASSESSMENT AND RECOMMENDATIONS:   1. Advance Care Planning/Goals of Care: Goals include to maximize quality of life and symptom management. Our advance care planning conversation included a discussion about:     The value and importance of advance care planning   Exploration of personal, cultural or spiritual beliefs that might influence medical decisions   Exploration of goals of care in the event of a sudden injury or illness   Identification of a healthcare agent - niece  Review  of an advance directive document- DNR, most not yet on file  2. Symptom Management:   I met  with the patient and her niece in her room. We were discussing her upcoming disposition. Niece has been working on assisted living but she feels that it may be more prudent to move her closer to [place] which is where she lives. She also feels patient may need a higher level of care and I concur. we discussed her recent decrease in PO and need for  continued Iv replenishment. Patient has also pulled out all of her accesses  put in recently. I asked her if she was telling us that she did not want them. She looked at me but did not answer the question. She has a tray in front of her and  is eating very little. I provided education for Shawna Lin about end of life symptoms and syndromes.   We discussed the body's shutting down due to gradual organ failure. One organ may fail but the rest will follow due to the loss of the function of 1 vital organ. I offered Hospice services which would be reasonable given patient's decline. Her GFR is < 15 and she has been noted by nephrology to not be a candidate for dialysis. She has continued decreased l intake and ensuing dehydration.   Niece  wanted to discuss some of this disposition with the facility social worker. I will follow up in a week or two to ascertain the plan. Social work feels she may be nearing the end of her rehabilitation window.   3. Follow up Palliative Care Visit: Palliative care will continue to follow for goals of care clarification and symptom management. Return 2 weeks or prn.  4. Family /Caregiver/Community Supports: Niece is poa but lives in [place]. Looking at [facility] or LTC, possibly in [place].  5. Cognitive / Functional decline: A and O x 1, eating less, sleeping more. Dependent in all adls.  I spent 35 minutes providing this consultation,  from 1500 to 1535. More than 50% of the time in this  consultation was spent in counseling and care coordination.  CODE STATUS: FULL CODE  PPS: 30%  HOSPICE ELIGIBILITY/DIAGNOSIS: yes/ debility, ESRD  Subjective:  CHIEF COMPLAINT: debility, cognitive changes, CKD 5  HISTORY OF PRESENT ILLNESS:  Shawna Lin is a 87 y.o. year old female  with H/o CKD 5, fx of back, protein calorie malnutrition, cognitive impairment. Here for rehab following lumbar vertebral fx. She has declined while here, intake is now very poor ( < 25%) and she is getting  less interactive. She is sleeping more and PO fluid intake is minimal, leading to dehydration. She is not a dialysis candidate per nephrology note .   We are asked to consult around advance care planning and complex medical decision making.    Review and summarization of old Epic records shows or history from other than patient.  Review or lab tests, radiology,  or medicine. Lab reports, GFR Review of case with family member. Shawna Lin  History obtained from review of EMR, discussion with primary team, and  interview with family, caregiver  and/or Shawna Lin. Records reviewed and summarized above.   CURRENT PROBLEM LIST:  Patient Active Problem List   Diagnosis Date Noted  . IDA (iron deficiency anemia) 07/26/2020  . Palliative care by specialist   . Goals of care, counseling/discussion   . General weakness   . L2 vertebral fracture ([place]) 07/12/2020  . Anemia 07/12/2020  . Fall 07/12/2020  . CKD (chronic kidney disease), stage III ([place]) 07/12/2020  . AKI (acute kidney injury) ([place]) 07/12/2020  . Syncope 08/26/2019  . Recurrent syncope 08/25/2019  . QT prolongation 08/25/2019  . Seizure disorder ([place]) 08/25/2019  . Pain due to onychomycosis of toenails of both feet 01/07/2019  . Dementia ([place]) 12/21/2015  . No blood products 12/21/2015  . 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 Memory loss 09/02/2015  . HTN (hypertension) 09/02/2015   PAST MEDICAL HISTORY:  Active Ambulatory Problems    Diagnosis Date Noted  . Memory loss 09/02/2015  . HTN (hypertension) 09/02/2015  . Dementia ([place]) 12/21/2015  . No blood products 12/21/2015  . 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 Pain due to onychomycosis of toenails of both feet 01/07/2019  . Recurrent syncope 08/25/2019  . QT prolongation 08/25/2019  . Seizure disorder ([place]) 08/25/2019  . Syncope 08/26/2019  . L2 vertebral fracture ([place]) 07/12/2020  . Anemia 07/12/2020  . Fall 07/12/2020  . CKD (chronic kidney disease), stage III ([place]) 07/12/2020   . AKI (acute kidney injury) ([place]) 07/12/2020  . Palliative care by specialist   . Goals of care, counseling/discussion   . General weakness   . IDA (iron deficiency anemia) 07/26/2020   Resolved Ambulatory Problems    Diagnosis Date Noted  . No Resolved Ambulatory Problems   Past Medical History:  Diagnosis Date  . Hypertension   . Iron deficiency anemia   . Vitamin D deficiency    SOCIAL HX:  Social History   Tobacco Use  . Smoking status: Never Smoker  . Smokeless tobacco: Never Used  Substance Use Topics  . Alcohol use: No    Alcohol/week: 0.0 standard drinks   FAMILY HX:  Family History  Problem Relation Age of Onset  . Heart attack Father   . Hypertension Mother   . Colon cancer Brother   . Hypertension Brother   . Diabetes Sister   . Kidney failure Sister   . Hypertension Sister   . Dementia Neg Hx       ALLERGIES: No Known Allergies  PERTINENT MEDICATIONS:  Outpatient Encounter Medications as of 08/29/2020  Medication Sig  . acetaminophen (TYLENOL) 325 MG tablet Take 650 mg by mouth every 12 (twelve) hours as needed (knee pain).   . ADULT ASPIRIN LOW STRENGTH PO Take by mouth.  Marland Kitchen albuterol (ACCUNEB) 0.63 MG/3ML nebulizer solution Take 1 ampule by nebulization every 6 (six) hours as needed for Wheezing.  Marland Kitchen amLODipine (NORVASC) 10 MG tablet Take 1 tablet (10 mg total) by mouth daily.  . cholecalciferol (VITAMIN D3) 25 MCG (1000 UNIT) tablet Take 1,000 Units by mouth daily.  . Emollient (EUCERIN) lotion Apply 1 mL topically in the morning and at bedtime. Apply 1 application to lower extremities twice daily  . Ensure (ENSURE) Take 237 mLs by mouth 3 (three) times daily.  . folic acid (FOLVITE) 1 MG tablet Take 2 tablets (2 mg total) by mouth daily.  . furosemide (LASIX) 20 MG tablet Take 1 tablet (20 mg total) by mouth daily as needed for fluid or edema.  . levETIRAcetam (KEPPRA) 500 MG tablet Take 1 tablet (500 mg total) by mouth 2 (two) times daily.  Marland Kitchen  OLANZapine (ZYPREXA) 2.5 MG tablet Take 2.5 mg by mouth at bedtime.  . senna-docusate (SENOKOT-S) 8.6-50 MG tablet Take 1 tablet by mouth at bedtime.  . sertraline (ZOLOFT) 100 MG tablet Take 100 mg by mouth daily.  . vitamin B-12 (CYANOCOBALAMIN) 1000 MCG tablet Take 1 tablet (1,000 mcg total) by mouth daily.   No facility-administered encounter medications on file as of 08/29/2020.    Objective: ROS/self and niece  General: NAD EYES: denies vision changes ENMT: endorses mild dysphagia Cardiovascular: denies chest pain Pulmonary: denies cough, denies increased SOB Abdomen: endorses poor appetite, occconstipation, endorses incontinence of bowel GU: denies dysuria, endorses incontinence of urine MSK:  endorses ROM limitations, no falls reported Skin: denies rashes or wounds Neurological: endorses increased  weakness, denies pain, denies insomnia Psych: Endorses withdrawn mood Heme/lymph/immuno: denies bruises, abnormal bleeding  Physical Exam: Current and past weights:current weight 155 lbs, 165 lbs 4 months ago, 10 lb loss Constitutional:  NAD General: frail appearing, thin EYES: anicteric sclera, lids intact, no discharge  ENMT: intact hearing,oral mucous membranes dry, lower teeth only. CV:  no LE edema Pulmonary: no increased work of breathing, no cough, no audible wheezes, room air Abdomen: intake <25%, no ascites GU: deferred MSK: mod sarcopenia, decreased ROM in all extremities, no contractures of LE, non ambulatory Skin: warm and dry, no rashes or wounds on visible skin Neuro: Generalized weakness, ++cognitive impairment Psych: non-anxious affect, A and O x 1, withdrawn Hem/lymph/immuno: no widespread bruising   Thank you for the opportunity to participate in the care of Shawna Lin.  The palliative care team will continue to follow. Please call our office at [phone] if we can be of additional assistance.  Jason Coop, NP , DNP, MPH, AGPCNP-BC,  ACHPN   COVID-19 PATIENT SCREENING TOOL  Person answering questions: _______staff____________   1.  Is the patient or any family member in the home showing any signs or symptoms regarding respiratory infection?                  Person with Symptom  ______________na___________ a. Fever/chills/headache  Yes___ No__X_            b. Shortness of breath                                                            Yes___ No__X_           c. Cough/congestion                                               Yes___  No__X_          d. Muscle/Body aches/pains                                                   Yes___ No__X_         e. Gastrointestinal symptoms (diarrhea,nausea)             Yes___ No__X_         f. Sudden loss of smell or taste      Yes___ No__X_        2. Within the past 10 days, has anyone living in the home had any contact with someone with or under investigation for COVID-19?    Yes___ No__X__   Person __________________  

## 2020-09-01 ENCOUNTER — Inpatient Hospital Stay: Payer: Medicare Other | Admitting: Family

## 2020-09-01 ENCOUNTER — Inpatient Hospital Stay: Payer: Medicare Other | Attending: Family

## 2020-09-01 ENCOUNTER — Inpatient Hospital Stay: Payer: Medicare Other

## 2020-09-07 ENCOUNTER — Telehealth: Payer: Self-pay | Admitting: Primary Care

## 2020-09-07 ENCOUNTER — Other Ambulatory Visit: Payer: Self-pay

## 2020-09-07 ENCOUNTER — Inpatient Hospital Stay
Admission: EM | Admit: 2020-09-07 | Discharge: 2020-09-09 | DRG: 871 | Disposition: A | Discharge status: Hospice | Payer: Medicare Other | Attending: Internal Medicine | Admitting: Internal Medicine

## 2020-09-07 ENCOUNTER — Emergency Department: Payer: Medicare Other

## 2020-09-07 ENCOUNTER — Encounter: Payer: Self-pay | Admitting: Emergency Medicine

## 2020-09-07 DIAGNOSIS — N1832 Chronic kidney disease, stage 3b: Secondary | ICD-10-CM | POA: Diagnosis present

## 2020-09-07 DIAGNOSIS — Z79899 Other long term (current) drug therapy: Secondary | ICD-10-CM

## 2020-09-07 DIAGNOSIS — G9341 Metabolic encephalopathy: Secondary | ICD-10-CM | POA: Diagnosis present

## 2020-09-07 DIAGNOSIS — R4189 Other symptoms and signs involving cognitive functions and awareness: Secondary | ICD-10-CM

## 2020-09-07 DIAGNOSIS — Z66 Do not resuscitate: Secondary | ICD-10-CM | POA: Diagnosis present

## 2020-09-07 DIAGNOSIS — F0390 Unspecified dementia without behavioral disturbance: Secondary | ICD-10-CM | POA: Diagnosis present

## 2020-09-07 DIAGNOSIS — G40909 Epilepsy, unspecified, not intractable, without status epilepticus: Secondary | ICD-10-CM | POA: Diagnosis present

## 2020-09-07 DIAGNOSIS — R1310 Dysphagia, unspecified: Secondary | ICD-10-CM | POA: Diagnosis present

## 2020-09-07 DIAGNOSIS — Z8249 Family history of ischemic heart disease and other diseases of the circulatory system: Secondary | ICD-10-CM

## 2020-09-07 DIAGNOSIS — Z7982 Long term (current) use of aspirin: Secondary | ICD-10-CM

## 2020-09-07 DIAGNOSIS — A419 Sepsis, unspecified organism: Secondary | ICD-10-CM | POA: Diagnosis not present

## 2020-09-07 DIAGNOSIS — N179 Acute kidney failure, unspecified: Secondary | ICD-10-CM | POA: Diagnosis not present

## 2020-09-07 DIAGNOSIS — E872 Acidosis: Secondary | ICD-10-CM | POA: Diagnosis present

## 2020-09-07 DIAGNOSIS — R6520 Severe sepsis without septic shock: Secondary | ICD-10-CM

## 2020-09-07 DIAGNOSIS — I959 Hypotension, unspecified: Secondary | ICD-10-CM

## 2020-09-07 DIAGNOSIS — R4182 Altered mental status, unspecified: Secondary | ICD-10-CM

## 2020-09-07 DIAGNOSIS — D649 Anemia, unspecified: Secondary | ICD-10-CM | POA: Diagnosis present

## 2020-09-07 DIAGNOSIS — R778 Other specified abnormalities of plasma proteins: Secondary | ICD-10-CM | POA: Diagnosis present

## 2020-09-07 DIAGNOSIS — R6521 Severe sepsis with septic shock: Secondary | ICD-10-CM | POA: Diagnosis present

## 2020-09-07 DIAGNOSIS — Z20822 Contact with and (suspected) exposure to covid-19: Secondary | ICD-10-CM | POA: Diagnosis present

## 2020-09-07 DIAGNOSIS — E875 Hyperkalemia: Secondary | ICD-10-CM | POA: Diagnosis present

## 2020-09-07 DIAGNOSIS — Z515 Encounter for palliative care: Secondary | ICD-10-CM

## 2020-09-07 DIAGNOSIS — E860 Dehydration: Secondary | ICD-10-CM | POA: Diagnosis present

## 2020-09-07 DIAGNOSIS — I10 Essential (primary) hypertension: Secondary | ICD-10-CM | POA: Diagnosis present

## 2020-09-07 DIAGNOSIS — E8729 Other acidosis: Secondary | ICD-10-CM

## 2020-09-07 DIAGNOSIS — I129 Hypertensive chronic kidney disease with stage 1 through stage 4 chronic kidney disease, or unspecified chronic kidney disease: Secondary | ICD-10-CM | POA: Diagnosis present

## 2020-09-07 DIAGNOSIS — E870 Hyperosmolality and hypernatremia: Secondary | ICD-10-CM | POA: Diagnosis present

## 2020-09-07 DIAGNOSIS — D631 Anemia in chronic kidney disease: Secondary | ICD-10-CM | POA: Diagnosis present

## 2020-09-07 HISTORY — DX: Disorder of kidney and ureter, unspecified: N28.9

## 2020-09-07 LAB — HEPATIC FUNCTION PANEL
ALT: 10 U/L (ref 0–44)
AST: 29 U/L (ref 15–41)
Albumin: 3.8 g/dL (ref 3.5–5.0)
Alkaline Phosphatase: 91 U/L (ref 38–126)
Bilirubin, Direct: 0.1 mg/dL (ref 0.0–0.2)
Indirect Bilirubin: 1.3 mg/dL — ABNORMAL HIGH (ref 0.3–0.9)
Total Bilirubin: 1.4 mg/dL — ABNORMAL HIGH (ref 0.3–1.2)
Total Protein: 6.3 g/dL — ABNORMAL LOW (ref 6.5–8.1)

## 2020-09-07 LAB — LACTIC ACID, PLASMA
Lactic Acid, Venous: 4.9 mmol/L (ref 0.5–1.9)
Lactic Acid, Venous: 2.7 mmol/L (ref 0.5–1.9)

## 2020-09-07 LAB — RESP PANEL BY RT-PCR (FLU A&B, COVID) ARPGX2
Influenza A by PCR: NEGATIVE
Influenza B by PCR: NEGATIVE
SARS Coronavirus 2 by RT PCR: NEGATIVE

## 2020-09-07 LAB — CBC WITH DIFFERENTIAL/PLATELET
Abs Immature Granulocytes: 0.09 10*3/uL — ABNORMAL HIGH (ref 0.00–0.07)
Basophils Absolute: 0 10*3/uL (ref 0.0–0.1)
Basophils Relative: 0 %
Eosinophils Absolute: 0 10*3/uL (ref 0.0–0.5)
Eosinophils Relative: 0 %
HCT: 38.2 % (ref 36.0–46.0)
Hemoglobin: 11 g/dL — ABNORMAL LOW (ref 12.0–15.0)
Immature Granulocytes: 1 %
Lymphocytes Relative: 21 %
Lymphs Abs: 2.1 10*3/uL (ref 0.7–4.0)
MCH: 25.1 pg — ABNORMAL LOW (ref 26.0–34.0)
MCHC: 28.8 g/dL — ABNORMAL LOW (ref 30.0–36.0)
MCV: 87.2 fL (ref 80.0–100.0)
Monocytes Absolute: 0.4 10*3/uL (ref 0.1–1.0)
Monocytes Relative: 4 %
Neutro Abs: 7.5 10*3/uL (ref 1.7–7.7)
Neutrophils Relative %: 74 %
Platelets: 131 10*3/uL — ABNORMAL LOW (ref 150–400)
RBC: 4.38 MIL/uL (ref 3.87–5.11)
RDW: 22.9 % — ABNORMAL HIGH (ref 11.5–15.5)
Smear Review: NORMAL
WBC: 10.1 10*3/uL (ref 4.0–10.5)
nRBC: 0.3 % — ABNORMAL HIGH (ref 0.0–0.2)

## 2020-09-07 LAB — BASIC METABOLIC PANEL
Anion gap: 16 — ABNORMAL HIGH (ref 5–15)
BUN: 37 mg/dL — ABNORMAL HIGH (ref 8–23)
CO2: 18 mmol/L — ABNORMAL LOW (ref 22–32)
Calcium: 13.7 mg/dL (ref 8.9–10.3)
Chloride: 115 mmol/L — ABNORMAL HIGH (ref 98–111)
Creatinine, Ser: 3.28 mg/dL — ABNORMAL HIGH (ref 0.44–1.00)
GFR, Estimated: 13 mL/min — ABNORMAL LOW (ref >60)
Glucose, Bld: 171 mg/dL — ABNORMAL HIGH (ref 70–99)
Potassium: 3.7 mmol/L (ref 3.5–5.1)
Sodium: 149 mmol/L — ABNORMAL HIGH (ref 135–145)

## 2020-09-07 LAB — BLOOD GAS, VENOUS
Acid-base deficit: 2.9 mmol/L — ABNORMAL HIGH (ref 0.0–2.0)
Bicarbonate: 24.4 mmol/L (ref 20.0–28.0)
O2 Saturation: 56.4 %
Patient temperature: 37
pCO2, Ven: 52 mmHg (ref 44.0–60.0)
pH, Ven: 7.28 (ref 7.250–7.430)
pO2, Ven: 34 mmHg (ref 32.0–45.0)

## 2020-09-07 LAB — TROPONIN I (HIGH SENSITIVITY)
Troponin I (High Sensitivity): 88 ng/L — ABNORMAL HIGH (ref <18)
Troponin I (High Sensitivity): 118 ng/L (ref <18)

## 2020-09-07 LAB — MAGNESIUM: Magnesium: 1.8 mg/dL (ref 1.7–2.4)

## 2020-09-07 MED ORDER — SODIUM CHLORIDE 0.9 % IV BOLUS
500.0000 mL | Freq: Once | INTRAVENOUS | Status: AC
  Administered 2020-09-07: 500 mL via INTRAVENOUS

## 2020-09-07 MED ORDER — SODIUM CHLORIDE 0.9 % IV BOLUS
1000.0000 mL | Freq: Once | INTRAVENOUS | Status: AC
  Administered 2020-09-07: 1000 mL via INTRAVENOUS

## 2020-09-07 MED ORDER — METRONIDAZOLE IN NACL 5-0.79 MG/ML-% IV SOLN
500.0000 mg | Freq: Once | INTRAVENOUS | Status: AC
  Administered 2020-09-07: 500 mg via INTRAVENOUS
  Filled 2020-09-07: qty 100

## 2020-09-07 MED ORDER — CALCIUM GLUCONATE-NACL 1-0.675 GM/50ML-% IV SOLN
1.0000 g | Freq: Once | INTRAVENOUS | Status: AC
  Administered 2020-09-07: 1000 mg via INTRAVENOUS
  Filled 2020-09-07: qty 50

## 2020-09-07 MED ORDER — SODIUM CHLORIDE 0.9 % IV BOLUS: 1000.0000 mL | Freq: Once | INTRAVENOUS | Status: DC

## 2020-09-07 MED ORDER — VANCOMYCIN HCL IN DEXTROSE 1-5 GM/200ML-% IV SOLN
1000.0000 mg | Freq: Once | INTRAVENOUS | Status: AC
  Administered 2020-09-07: 1000 mg via INTRAVENOUS
  Filled 2020-09-07: qty 200

## 2020-09-07 MED ORDER — SODIUM CHLORIDE 0.9 % IV SOLN
2.0000 g | Freq: Once | INTRAVENOUS | Status: AC
  Administered 2020-09-07: 2 g via INTRAVENOUS
  Filled 2020-09-07: qty 2

## 2020-09-07 NOTE — ED Provider Notes (Signed)
[facility] Emergency Department Provider Note  ____________________________________________   Event Date/Time   First MD Initiated Contact with Patient 09/07/20 1912     (approximate)  I have reviewed the triage vital signs and the nursing notes.   HISTORY  Chief Complaint unresponsive    HPI Chrisy Jackson is a 87 y.o. female with hypertension, dementia, CKD who comes in for unresponsiveness.  Patient had worsening CKD but it sounds like she is not a candidate for hemodialysis due to her frailty.  They reached out to her about hospice but at that time was not able to get a hold of the legal guardian.  EMS was called out due to unresponsiveness and when they got there patient's blood pressure was in the 30s.  Normal sugar.  Patient was being bagged due to low respiratory rate.  According to the nurse patient had some right eye gaze deviation initially but upon my assessment this is gone away  Unable to get full HPI due to patient's altered mental status          Past Medical History:  Diagnosis Date  . Hypertension   . Iron deficiency anemia   . Memory loss   . Renal disorder   . Vitamin D deficiency     Patient Active Problem List   Diagnosis Date Noted  . IDA (iron deficiency anemia) 07/26/2020  . Palliative care by specialist   . Goals of care, counseling/discussion   . General weakness   . L2 vertebral fracture ([place]) 07/12/2020  . Anemia 07/12/2020  . Fall 07/12/2020  . CKD (chronic kidney disease), stage III ([place]) 07/12/2020  . AKI (acute kidney injury) ([place]) 07/12/2020  . Syncope 08/26/2019  . Recurrent syncope 08/25/2019  . QT prolongation 08/25/2019  . Seizure disorder ([place]) 08/25/2019  . Pain due to onychomycosis of toenails of both feet 01/07/2019  . Dementia ([place]) 12/21/2015  . No blood products 12/21/2015  . 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 Memory loss 09/02/2015  . HTN (hypertension) 09/02/2015    Past Surgical History:   Procedure Laterality Date  . UTERINE FIBROID SURGERY      Prior to Admission medications   Medication Sig Start Date End Date Taking? Authorizing Provider  acetaminophen (TYLENOL) 325 MG tablet Take 650 mg by mouth every 12 (twelve) hours as needed (knee pain).     [provider]  ADULT ASPIRIN LOW STRENGTH PO Take by mouth.    [provider]  albuterol (ACCUNEB) 0.63 MG/3ML nebulizer solution Take 1 ampule by nebulization every 6 (six) hours as needed for Wheezing.    [provider]  amLODipine (NORVASC) 10 MG tablet Take 1 tablet (10 mg total) by mouth daily. 07/23/20   Florencia Reasons, MD  cholecalciferol (VITAMIN D3) 25 MCG (1000 UNIT) tablet Take 1,000 Units by mouth daily.    [provider]  Emollient (EUCERIN) lotion Apply 1 mL topically in the morning and at bedtime. Apply 1 application to lower extremities twice daily    [provider]  Ensure (ENSURE) Take 237 mLs by mouth 3 (three) times daily.    [provider]  folic acid (FOLVITE) 1 MG tablet Take 2 tablets (2 mg total) by mouth daily. 07/23/20   Florencia Reasons, MD  furosemide (LASIX) 20 MG tablet Take 1 tablet (20 mg total) by mouth daily as needed for fluid or edema. 07/22/20   Florencia Reasons, MD  levETIRAcetam (KEPPRA) 500 MG tablet Take 1 tablet (500 mg total)  by mouth 2 (two) times daily. 10/15/19   Melvenia Beam, MD  OLANZapine (ZYPREXA) 2.5 MG tablet Take 2.5 mg by mouth at bedtime.    [provider]  senna-docusate (SENOKOT-S) 8.6-50 MG tablet Take 1 tablet by mouth at bedtime. 07/22/20   Florencia Reasons, MD  sertraline (ZOLOFT) 100 MG tablet Take 100 mg by mouth daily.    [provider]  vitamin B-12 (CYANOCOBALAMIN) 1000 MCG tablet Take 1 tablet (1,000 mcg total) by mouth daily. 07/22/20   Florencia Reasons, MD    Allergies Patient has no known allergies.  Family History  Problem Relation Age of Onset  . Heart attack Father   . Hypertension Mother   . Colon cancer Brother   .  Hypertension Brother   . Diabetes Sister   . Kidney failure Sister   . Hypertension Sister   . Dementia Neg Hx     Social History Social History   Tobacco Use  . Smoking status: Never Smoker  . Smokeless tobacco: Never Used  Vaping Use  . Vaping Use: Never used  Substance Use Topics  . Alcohol use: No    Alcohol/week: 0.0 standard drinks  . Drug use: No      Review of Systems Unable to get full review of systems due to altered mental status ____________________________________________   PHYSICAL EXAM:  VITAL SIGNS: ED Triage Vitals  Enc Vitals Group     BP 09/07/20 1907 (!) 148/82     Pulse Rate 09/07/20 1907 69     Resp 09/07/20 1907 16     Temp --      Temp src --      SpO2 09/07/20 1907 100 %     Weight 09/07/20 1908 158 lb 11.7 oz (72 kg)     Height 09/07/20 1908 '4\' 11"'$  (1.499 m)     Head Circumference --      Peak Flow --      Pain Score --      Pain Loc --      Pain Edu? --      Excl. in [place]? --     Constitutional: Altered, blinking but otherwise not responding to commands Eyes: Conjunctivae are normal.  No swelling around the eyes Head: Atraumatic. Nose: No congestion/rhinnorhea. Mouth/Throat: Mucous membranes are moist.   Neck: No stridor. Trachea Midline. FROM Cardiovascular: Normal rate, regular rhythm. Grossly normal heart sounds.  Good peripheral circulation. Respiratory: Normal respiratory effort.  No retractions. Lungs CTAB. Gastrointestinal: Soft and nontender. No distention. No abdominal bruits.  Musculoskeletal: No lower extremity tenderness nor edema.  No joint effusions. Neurologic: Patient is altered, unable to fully assess but no obvious droop Skin:  Skin is warm, dry and intact. No rash noted. Psychiatric: Unable to assess due to altered mental status GU: Deferred   ____________________________________________   LABS (all labs ordered are listed, but only abnormal results are displayed)  Labs Reviewed  CBC WITH  DIFFERENTIAL/PLATELET  BASIC METABOLIC PANEL  HEPATIC FUNCTION PANEL  MAGNESIUM  BLOOD GAS, VENOUS  LACTIC ACID, PLASMA  LACTIC ACID, PLASMA  URINALYSIS, COMPLETE (UACMP) WITH MICROSCOPIC  TROPONIN I (HIGH SENSITIVITY)   ____________________________________________   ED ECG REPORT I, Vanessa Ballard, the attending physician, personally viewed and interpreted this ECG.  Normal sinus rate of 69, no ST elevation, no T wave inversion except for aVL, normal intervals ____________________________________________  RADIOLOGY Robert Bellow, personally viewed and evaluated these images (plain radiographs) as part of my medical decision  making, as well as reviewing the written report by the radiologist.  ED MD interpretation: No pneumonia  Official radiology report(s): CT Head Wo Contrast  Result Date: 09/07/2020 CLINICAL DATA:  Unresponsive, hypotensive, altered level of consciousness EXAM: CT HEAD WITHOUT CONTRAST TECHNIQUE: Contiguous axial images were obtained from the base of the skull through the vertex without intravenous contrast. COMPARISON:  07/12/2020 FINDINGS: Brain: No acute infarct or hemorrhage. Lateral ventricles and midline structures are unremarkable. No acute extra-axial fluid collections. No mass effect. Vascular: No hyperdense vessel or unexpected calcification. Skull: Mottled lucencies throughout the calvarium are unchanged since prior study. No acute displaced fractures. Sinuses/Orbits: No acute finding. Other: None. IMPRESSION: 1. No acute intracranial process. 2. Stable mottled lucency throughout the calvarium, consistent with chronic demineralization, anemia, or marrow replacement process. Electronically Signed   By: Randa Ngo M.D.   On: 09/07/2020 20:05   DG Chest Portable 1 View  Result Date: 09/07/2020 CLINICAL DATA:  87 year old female with altered mental status. EXAM: PORTABLE CHEST 1 VIEW COMPARISON:  Chest radiograph dated 07/12/2020. FINDINGS: The lungs are  clear. There is no pleural effusion pneumothorax. The cardiac silhouette is within limits. No acute osseous pathology. Osteopenia. Degenerative changes of the spine. IMPRESSION: No active disease. Electronically Signed   By: Anner Crete M.D.   On: 09/07/2020 19:59    ____________________________________________   PROCEDURES  Procedure(s) performed (including Critical Care):  .Critical Care Performed by: Vanessa Ko [place], MD Authorized by: Vanessa [place], MD   Critical care provider statement:    Critical care time (minutes):  45   Critical care was necessary to treat or prevent imminent or life-threatening deterioration of the following conditions:  Sepsis   Critical care was time spent personally by me on the following activities:  Discussions with consultants, evaluation of patient's response to treatment, examination of patient, ordering and performing treatments and interventions, ordering and review of laboratory studies, ordering and review of radiographic studies, pulse oximetry, re-evaluation of patient's condition, obtaining history from patient or surrogate and review of old charts .1-3 Lead EKG Interpretation Performed by: Vanessa [place], MD Authorized by: Vanessa [place], MD     Interpretation: normal     ECG rate:  70s    ECG rate assessment: normal     Rhythm: sinus rhythm     Ectopy: none     Conduction: normal   Ultrasound ED Peripheral IV (Provider)  Date/Time: 09/07/2020 9:02 PM Performed by: Vanessa [place], MD Authorized by: Vanessa , MD   Procedure details:    Indications: multiple failed IV attempts     Skin Prep: chlorhexidine gluconate     Location:  Left AC   Angiocath:  20 G   Bedside Ultrasound Guided: Yes     Images: not archived     Patient tolerated procedure without complications: No     Dressing applied: No       ____________________________________________   INITIAL IMPRESSION / ASSESSMENT AND PLAN / ED COURSE  Naydean Kinnie was  evaluated in Emergency Department on 09/07/2020 for the symptoms described in the history of present illness. She was evaluated in the context of the global COVID-19 pandemic, which necessitated consideration that the patient might be at risk for infection with the SARS-CoV-2 virus that causes COVID-19. Institutional protocols and algorithms that pertain to the evaluation of patients at risk for COVID-19 are in a state of rapid change based on information released by regulatory bodies including the CDC and federal and  state organizations. These policies and algorithms were followed during the patient's care in the ED.     Patient is a 87 year old who comes in with altered mental status and hypotensive.  Patient had a normal respiratory rate and bagging was discontinued.  Patient was satting 98% on room air.  Patient was started on 2 L of fluid for her hypotension and was responding well to that.  Labs were ordered to evaluate for Electra abnormalities, AKI.  CT head evaluate for intercranial hemorrhage given the concern for possible seizure earlier, urine evaluate for UTI, chest x-ray to evaluate for pneumonia.  At this time patient does not appear septic she is not tachycardic, normal respiratory rate and normal temperature.  We will continue to closely monitor.  Will get Covid swab.  I suspect that this could be from her worsening renal function and could be secondary to elevated BUN.  Will get labs to further evaluate.  Will get 1 g of calcium while waiting in case patient is hyperkalemic.  I was able to get a hold of [name] who is the legal guardian.  We had a lengthy discussion of the above and they would like patient to be DNR.  They understand that this would mean that no intubation or chest compressions will be done and she thinks that this is what patient would want.  She is otherwise okay with receiving fluids and further work-up at this time  Patient's lactate is elevated at 4.9.  Is hard to say if  this is secondary to the dehydration versus seizure.  Less likely sepsis but will cover with antibiotics just in case.  Patient received 30 cc/kg resuscitation.  Her blood pressures have responded well she does not need pressors at this time.  On repeat evaluation patient is more alert looking around and is able to mumble some words.  Labs have hemolyzed x2. Will be placed to get labs.  IV placed for labs.   Handed off to oncoming team pending admission after labs.     ____________________________________________   FINAL CLINICAL IMPRESSION(S) / ED DIAGNOSES   Final diagnoses:  Altered mental status, unspecified altered mental status type  Sepsis, due to unspecified organism, unspecified whether acute organ dysfunction present [facility])      MEDICATIONS GIVEN DURING THIS VISIT:  Medications  metroNIDAZOLE (FLAGYL) IVPB 500 mg (500 mg Intravenous New Bag/Given 09/07/20 2052)  vancomycin (VANCOCIN) IVPB 1000 mg/200 mL premix (has no administration in time range)  sodium chloride 0.9 % bolus 500 mL (0 mLs Intravenous Stopped 09/07/20 2004)  calcium gluconate 1 g/ 50 mL sodium chloride IVPB (0 g Intravenous Stopped 09/07/20 2047)  sodium chloride 0.9 % bolus 1,000 mL (0 mLs Intravenous Stopped 09/07/20 2048)  ceFEPIme (MAXIPIME) 2 g in sodium chloride 0.9 % 100 mL IVPB (0 g Intravenous Stopped 09/07/20 2047)  sodium chloride 0.9 % bolus 500 mL (500 mLs Intravenous New Bag/Given 09/07/20 2048)     ED Discharge Orders    None       Note:  This document was prepared using Dragon voice recognition software and may include unintentional dictation errors.   Vanessa [place], MD 09/07/20 2103

## 2020-09-07 NOTE — Telephone Encounter (Signed)
  Spoke with Charlsie Merles NP. She is concerned about patient status as her calcium is increasing and she is not a candidate for hemodialysis due to her frailty. She asked me to reach out to family to discuss Hospice and called Carlyle Basques. Left message as there was no answer.

## 2020-09-07 NOTE — ED Notes (Signed)
Patient alert in bed. Breathing easy and unlabored with symmetric chest rise and fall. Stretcher low and locked with side rails raised x2, call bell in reach, and cardiac monitor in place. Purewick remains in place as well. Pt in NAD, primary RN updated as to disconnection of completed vancomycin.

## 2020-09-07 NOTE — ED Notes (Signed)
ED Provider at bedside. 

## 2020-09-07 NOTE — ED Notes (Signed)
Patient to and from CT with this RN.

## 2020-09-07 NOTE — ED Triage Notes (Signed)
Pt in via EMS from [facility] with c/o being unresponsive. EMS reports called out due to breathing difficulties upon their arrival BP was [id number] systolic. FABA 186. Pt with ESRD receiving no dialysis. EMS bagging pt upon arrival due to RR less than 8

## 2020-09-07 NOTE — ED Notes (Signed)
2 INT attempted by Jonelle Sidle, RN. INT attempts unsuccessful.

## 2020-09-07 NOTE — ED Notes (Signed)
Patient is resting comfortably. 

## 2020-09-07 NOTE — ED Notes (Signed)
Patient now more rousable. Patient is alert, with mumbling speech. Patient following commands and withdrawing from pain. Patient updated on POC. Thedore Mins, RN at bedside for ultrasound IV attempt.

## 2020-09-07 NOTE — ED Notes (Signed)
Calcium 13.7 MD notified

## 2020-09-08 ENCOUNTER — Non-Acute Institutional Stay: Payer: Medicare Other | Admitting: Primary Care

## 2020-09-08 ENCOUNTER — Inpatient Hospital Stay: Payer: Medicare Other

## 2020-09-08 ENCOUNTER — Encounter: Payer: Self-pay | Admitting: Internal Medicine

## 2020-09-08 DIAGNOSIS — N179 Acute kidney failure, unspecified: Secondary | ICD-10-CM | POA: Diagnosis present

## 2020-09-08 DIAGNOSIS — E8729 Other acidosis: Secondary | ICD-10-CM

## 2020-09-08 DIAGNOSIS — E870 Hyperosmolality and hypernatremia: Secondary | ICD-10-CM | POA: Diagnosis present

## 2020-09-08 DIAGNOSIS — A419 Sepsis, unspecified organism: Secondary | ICD-10-CM | POA: Diagnosis present

## 2020-09-08 DIAGNOSIS — G9341 Metabolic encephalopathy: Secondary | ICD-10-CM

## 2020-09-08 DIAGNOSIS — F0390 Unspecified dementia without behavioral disturbance: Secondary | ICD-10-CM | POA: Diagnosis present

## 2020-09-08 DIAGNOSIS — Z66 Do not resuscitate: Secondary | ICD-10-CM | POA: Diagnosis present

## 2020-09-08 DIAGNOSIS — E860 Dehydration: Secondary | ICD-10-CM | POA: Diagnosis present

## 2020-09-08 DIAGNOSIS — Z20822 Contact with and (suspected) exposure to covid-19: Secondary | ICD-10-CM | POA: Diagnosis present

## 2020-09-08 DIAGNOSIS — R6520 Severe sepsis without septic shock: Secondary | ICD-10-CM

## 2020-09-08 DIAGNOSIS — I129 Hypertensive chronic kidney disease with stage 1 through stage 4 chronic kidney disease, or unspecified chronic kidney disease: Secondary | ICD-10-CM | POA: Diagnosis present

## 2020-09-08 DIAGNOSIS — Z8249 Family history of ischemic heart disease and other diseases of the circulatory system: Secondary | ICD-10-CM | POA: Diagnosis not present

## 2020-09-08 DIAGNOSIS — N1832 Chronic kidney disease, stage 3b: Secondary | ICD-10-CM | POA: Diagnosis present

## 2020-09-08 DIAGNOSIS — R6521 Severe sepsis with septic shock: Secondary | ICD-10-CM | POA: Diagnosis present

## 2020-09-08 DIAGNOSIS — E875 Hyperkalemia: Secondary | ICD-10-CM | POA: Diagnosis present

## 2020-09-08 DIAGNOSIS — Z7189 Other specified counseling: Secondary | ICD-10-CM | POA: Diagnosis not present

## 2020-09-08 DIAGNOSIS — I959 Hypotension, unspecified: Secondary | ICD-10-CM

## 2020-09-08 DIAGNOSIS — Z515 Encounter for palliative care: Secondary | ICD-10-CM

## 2020-09-08 DIAGNOSIS — D631 Anemia in chronic kidney disease: Secondary | ICD-10-CM | POA: Diagnosis present

## 2020-09-08 DIAGNOSIS — R778 Other specified abnormalities of plasma proteins: Secondary | ICD-10-CM | POA: Diagnosis present

## 2020-09-08 DIAGNOSIS — R4189 Other symptoms and signs involving cognitive functions and awareness: Secondary | ICD-10-CM

## 2020-09-08 DIAGNOSIS — G40909 Epilepsy, unspecified, not intractable, without status epilepticus: Secondary | ICD-10-CM | POA: Diagnosis present

## 2020-09-08 DIAGNOSIS — Z7982 Long term (current) use of aspirin: Secondary | ICD-10-CM | POA: Diagnosis not present

## 2020-09-08 DIAGNOSIS — E872 Acidosis: Secondary | ICD-10-CM | POA: Diagnosis present

## 2020-09-08 DIAGNOSIS — R7989 Other specified abnormal findings of blood chemistry: Secondary | ICD-10-CM

## 2020-09-08 DIAGNOSIS — Z79899 Other long term (current) drug therapy: Secondary | ICD-10-CM | POA: Diagnosis not present

## 2020-09-08 DIAGNOSIS — N189 Chronic kidney disease, unspecified: Secondary | ICD-10-CM

## 2020-09-08 DIAGNOSIS — R1310 Dysphagia, unspecified: Secondary | ICD-10-CM | POA: Diagnosis present

## 2020-09-08 LAB — BASIC METABOLIC PANEL
Anion gap: 8 (ref 5–15)
Anion gap: 11 (ref 5–15)
BUN: 39 mg/dL — ABNORMAL HIGH (ref 8–23)
BUN: 36 mg/dL — ABNORMAL HIGH (ref 8–23)
CO2: 24 mmol/L (ref 22–32)
CO2: 19 mmol/L — ABNORMAL LOW (ref 22–32)
Calcium: 13.8 mg/dL (ref 8.9–10.3)
Calcium: 13.5 mg/dL (ref 8.9–10.3)
Chloride: 115 mmol/L — ABNORMAL HIGH (ref 98–111)
Chloride: 116 mmol/L — ABNORMAL HIGH (ref 98–111)
Creatinine, Ser: 3.19 mg/dL — ABNORMAL HIGH (ref 0.44–1.00)
Creatinine, Ser: 3.22 mg/dL — ABNORMAL HIGH (ref 0.44–1.00)
GFR, Estimated: 13 mL/min — ABNORMAL LOW (ref >60)
GFR, Estimated: 13 mL/min — ABNORMAL LOW (ref >60)
Glucose, Bld: 212 mg/dL — ABNORMAL HIGH (ref 70–99)
Glucose, Bld: 210 mg/dL — ABNORMAL HIGH (ref 70–99)
Potassium: 3.5 mmol/L (ref 3.5–5.1)
Potassium: 3.8 mmol/L (ref 3.5–5.1)
Sodium: 147 mmol/L — ABNORMAL HIGH (ref 135–145)
Sodium: 146 mmol/L — ABNORMAL HIGH (ref 135–145)

## 2020-09-08 LAB — CBC
HCT: 39.6 % (ref 36.0–46.0)
Hemoglobin: 11.7 g/dL — ABNORMAL LOW (ref 12.0–15.0)
MCH: 24.7 pg — ABNORMAL LOW (ref 26.0–34.0)
MCHC: 29.5 g/dL — ABNORMAL LOW (ref 30.0–36.0)
MCV: 83.7 fL (ref 80.0–100.0)
Platelets: 111 10*3/uL — ABNORMAL LOW (ref 150–400)
RBC: 4.73 MIL/uL (ref 3.87–5.11)
RDW: 23.1 % — ABNORMAL HIGH (ref 11.5–15.5)
WBC: 8.5 10*3/uL (ref 4.0–10.5)
nRBC: 0.2 % (ref 0.0–0.2)

## 2020-09-08 LAB — CORTISOL-AM, BLOOD: Cortisol - AM: 27.3 ug/dL — ABNORMAL HIGH (ref 6.7–22.6)

## 2020-09-08 LAB — URINALYSIS, COMPLETE (UACMP) WITH MICROSCOPIC
Bilirubin Urine: NEGATIVE
Glucose, UA: 50 mg/dL — AB
Ketones, ur: 5 mg/dL — AB
Leukocytes,Ua: NEGATIVE
Nitrite: NEGATIVE
Protein, ur: 100 mg/dL — AB
Specific Gravity, Urine: 1.016 (ref 1.005–1.030)
pH: 5 (ref 5.0–8.0)

## 2020-09-08 LAB — COMPREHENSIVE METABOLIC PANEL
ALT: 10 U/L (ref 0–44)
AST: 27 U/L (ref 15–41)
Albumin: 4 g/dL (ref 3.5–5.0)
Alkaline Phosphatase: 94 U/L (ref 38–126)
Anion gap: 12 (ref 5–15)
BUN: 38 mg/dL — ABNORMAL HIGH (ref 8–23)
CO2: 24 mmol/L (ref 22–32)
Calcium: 14.3 mg/dL (ref 8.9–10.3)
Chloride: 115 mmol/L — ABNORMAL HIGH (ref 98–111)
Creatinine, Ser: 3.29 mg/dL — ABNORMAL HIGH (ref 0.44–1.00)
GFR, Estimated: 13 mL/min — ABNORMAL LOW (ref >60)
Glucose, Bld: 156 mg/dL — ABNORMAL HIGH (ref 70–99)
Potassium: 4.1 mmol/L (ref 3.5–5.1)
Sodium: 151 mmol/L — ABNORMAL HIGH (ref 135–145)
Total Bilirubin: 0.9 mg/dL (ref 0.3–1.2)
Total Protein: 6.7 g/dL (ref 6.5–8.1)

## 2020-09-08 LAB — PHOSPHORUS: Phosphorus: 4.4 mg/dL (ref 2.5–4.6)

## 2020-09-08 LAB — PROCALCITONIN: Procalcitonin: 2.43 ng/mL

## 2020-09-08 LAB — PROTIME-INR
INR: 1.2 (ref 0.8–1.2)
Prothrombin Time: 14.4 seconds (ref 11.4–15.2)

## 2020-09-08 LAB — CK: Total CK: 42 U/L (ref 38–234)

## 2020-09-08 MED ORDER — LORAZEPAM 2 MG/ML IJ SOLN
0.5000 mg | INTRAMUSCULAR | Status: DC | PRN
Start: 2020-09-08 — End: 2020-09-09

## 2020-09-08 MED ORDER — HYDRALAZINE HCL 20 MG/ML IJ SOLN
10.0000 mg | Freq: Four times a day (QID) | INTRAMUSCULAR | Status: DC | PRN
  Administered 2020-09-08: 10 mg via INTRAVENOUS
  Filled 2020-09-08: qty 1

## 2020-09-08 MED ORDER — GLYCOPYRROLATE 0.2 MG/ML IJ SOLN
0.2000 mg | INTRAMUSCULAR | Status: DC | PRN
  Filled 2020-09-08: qty 1

## 2020-09-08 MED ORDER — DEXTROSE-NACL 5-0.45 % IV SOLN: INTRAVENOUS | Status: DC

## 2020-09-08 MED ORDER — LORAZEPAM 0.5 MG PO TABS: 0.5000 mg | ORAL_TABLET | ORAL | Status: DC | PRN

## 2020-09-08 MED ORDER — HALOPERIDOL 0.5 MG PO TABS
0.5000 mg | ORAL_TABLET | ORAL | Status: DC | PRN
  Filled 2020-09-08: qty 1

## 2020-09-08 MED ORDER — ACETAMINOPHEN 650 MG RE SUPP: 650.0000 mg | Freq: Four times a day (QID) | RECTAL | Status: DC | PRN

## 2020-09-08 MED ORDER — ONDANSETRON 4 MG PO TBDP
4.0000 mg | ORAL_TABLET | Freq: Four times a day (QID) | ORAL | Status: DC | PRN
  Filled 2020-09-08: qty 1

## 2020-09-08 MED ORDER — LORAZEPAM 2 MG/ML PO CONC: 0.5000 mg | ORAL | Status: DC | PRN

## 2020-09-08 MED ORDER — ENOXAPARIN SODIUM 30 MG/0.3ML ~~LOC~~ SOLN
30.0000 mg | SUBCUTANEOUS | Status: DC
  Administered 2020-09-08: 30 mg via SUBCUTANEOUS
  Filled 2020-09-08 (×2): qty 0.3

## 2020-09-08 MED ORDER — SODIUM CHLORIDE 0.9% FLUSH: 3.0000 mL | INTRAVENOUS | Status: DC | PRN

## 2020-09-08 MED ORDER — HALOPERIDOL LACTATE 5 MG/ML IJ SOLN: 0.5000 mg | INTRAMUSCULAR | Status: DC | PRN

## 2020-09-08 MED ORDER — ONDANSETRON HCL 4 MG/2ML IJ SOLN: 4.0000 mg | Freq: Four times a day (QID) | INTRAMUSCULAR | Status: DC | PRN

## 2020-09-08 MED ORDER — POLYVINYL ALCOHOL 1.4 % OP SOLN
1.0000 [drp] | Freq: Four times a day (QID) | OPHTHALMIC | Status: DC | PRN
  Filled 2020-09-08: qty 15

## 2020-09-08 MED ORDER — LACTATED RINGERS IV SOLN: INTRAVENOUS | Status: DC

## 2020-09-08 MED ORDER — GLYCOPYRROLATE 1 MG PO TABS
1.0000 mg | ORAL_TABLET | ORAL | Status: DC | PRN
  Filled 2020-09-08: qty 1

## 2020-09-08 MED ORDER — HYDROMORPHONE HCL 1 MG/ML IJ SOLN: 0.2500 mg | INTRAMUSCULAR | Status: DC | PRN

## 2020-09-08 MED ORDER — SODIUM CHLORIDE 0.9 % IV SOLN
1.0000 g | INTRAVENOUS | Status: DC
  Filled 2020-09-08: qty 1

## 2020-09-08 MED ORDER — ACETAMINOPHEN 325 MG PO TABS: 650.0000 mg | ORAL_TABLET | Freq: Four times a day (QID) | ORAL | Status: DC | PRN

## 2020-09-08 MED ORDER — VANCOMYCIN HCL 500 MG/100ML IV SOLN
500.0000 mg | Freq: Once | INTRAVENOUS | Status: AC
  Administered 2020-09-08: 500 mg via INTRAVENOUS
  Filled 2020-09-08: qty 100

## 2020-09-08 MED ORDER — PROMETHAZINE HCL 12.5 MG PO TABS
6.2500 mg | ORAL_TABLET | Freq: Four times a day (QID) | ORAL | Status: DC | PRN
Start: 2020-09-08 — End: 2020-09-09
  Filled 2020-09-08: qty 1

## 2020-09-08 MED ORDER — HALOPERIDOL LACTATE 2 MG/ML PO CONC
0.5000 mg | ORAL | Status: DC | PRN
  Filled 2020-09-08: qty 0.3

## 2020-09-08 MED ORDER — VANCOMYCIN VARIABLE DOSE PER UNSTABLE RENAL FUNCTION (PHARMACIST DOSING): Status: DC

## 2020-09-08 MED ORDER — SODIUM CHLORIDE 0.9% FLUSH
3.0000 mL | Freq: Two times a day (BID) | INTRAVENOUS | Status: DC
  Administered 2020-09-08: 3 mL via INTRAVENOUS

## 2020-09-08 MED ORDER — BIOTENE DRY MOUTH MT LIQD
15.0000 mL | OROMUCOSAL | Status: DC | PRN
Start: 2020-09-08 — End: 2020-09-09

## 2020-09-08 MED ORDER — DEXTROSE IN LACTATED RINGERS 5 % IV SOLN: INTRAVENOUS | Status: DC

## 2020-09-08 NOTE — ED Notes (Signed)
Secretary called to page attending for critical lab, Calcium, 13.8

## 2020-09-08 NOTE — ED Notes (Signed)
Sent msg to pharmacy re: missing dose Lovenox '30mg'$ .

## 2020-09-08 NOTE — ED Notes (Signed)
Niece, Carlyle Basques, who is POA, called. She states clearly that she does not want the patient to receive any PO meds at all due to her compromised kidney function. We discussed IV meds and subcut Lovenox, which she said patient can receive. She states that she does not want pt to be "poked and prodded". This nurse explained that pt was brought in unresponsive and had a full sepsis workup. She states that she will come to hospital today. This nurse told her that will communicate to Dr Arbutus Ped, DO so they can discuss plan of care.  Spoke with Dr Arbutus Ped face to face and informed of conversation with niece.

## 2020-09-08 NOTE — Progress Notes (Signed)
PROGRESS NOTE    Shawna Lin   [id number]  DOB: 11-Jul-1929  PCP: [name]., MD    DOA: 09/07/2020 LOS: 0   Brief Narrative   87 y.o. female with past medical history of dementia, ?seizure disorder (per past documentation), HTN, CKD IIIb, hospitalized from 12/28 to 07/22/2020 with an L2 vertebral fracture related to a fall but with finding of severe anemia of 5.2, treated with EPO and IV iron as patient is Jehovah's Witness and does not accept blood transfusions.  She was discharged back to rehab where she has been in her usual state of health until she was found unresponsive on 09/07/20.  On arrival of EMS her SBP was reportedly in the 30s, RR of 8 and arrived to the ED being bagged.  History of immediately preceding events limited.  Evaluation in the ED -  Initial BP 77/53 improved with IV fluid resuscitation.  Afebrile, intermittent tachypnea but otherwise stable vitals.  Labs most notable for acute renal failure with Cr 3.28 (baseline ~1.3), high anion gap 16, hypernatremia 145, lactic acidosis 4.9>>2.7 with IV hydration, critical hypercalcemia 13/7. CT head and CXR negative for acute findings. Treated per sepsis protocol in the ED and admitted for further evaluation and management.    Significant Events: 2/24 - transitioned to comfort care  Assessment & Plan   Principal Problem:   Severe sepsis/ septic shock(HCC) Active Problems:   HTN (hypertension)   Seizure disorder (HCC)   Anemia   Acute kidney injury superimposed on CKD IIIb (HCC)   Hypercalcemia   Hypernatremia   Acute metabolic encephalopathy   High anion gap metabolic acidosis   Hypotension   Unresponsiveness   Elevated troponin   COMFORT CARE STATUS -  --continue comfort measures per orders --notify provider if any signs of pain, discomfort or distress    Patient BMI: Body mass index is 27.56 kg/m.   DVT prophylaxis:    Diet:  Diet Orders (From admission, onward)    Start     Ordered    09/08/20 1640  DIET DYS 2 Room service appropriate? Yes; Fluid consistency: Thin  Diet effective now       Question Answer Comment  Room service appropriate? Yes   Fluid consistency: Thin      09/08/20 1640            Code Status: DNR    Subjective 09/08/20    Pt seen in ED holding for a bed.  She is able to tell me her name.  Denies any pain, nausea, fever/chills, abdominal or chest pain or other complaints.     Disposition Plan & Communication   Status is: Inpatient  Remains inpatient appropriate because:awaiting hospice bed, on comfort care   Dispo: The patient is from: Home              Anticipated d/c is to: hospice              Patient currently is not medically stable to d/c.   Difficult to place patient No   Family Communication: none at bedside, updated by palliative care & hospice.    Consults, Procedures, Significant Events   Consultants:   Palliative care  Procedures:   None  Antimicrobials:  Anti-infectives (From admission, onward)   Start     Dose/Rate Route Frequency Ordered Stop   09/08/20 2000  ceFEPIme (MAXIPIME) 1 g in sodium chloride 0.9 % 100 mL IVPB  Status:  Discontinued  1 g 200 mL/hr over 30 Minutes Intravenous Every 24 hours 09/08/20 1010 09/08/20 1603   09/08/20 0455  vancomycin variable dose per unstable renal function (pharmacist dosing)  Status:  Discontinued         Does not apply See admin instructions 09/08/20 0455 09/08/20 1603   09/08/20 0115  vancomycin (VANCOREADY) IVPB 500 mg/100 mL        500 mg 100 mL/hr over 60 Minutes Intravenous  Once 09/08/20 0112 09/08/20 0431   09/07/20 2000  ceFEPIme (MAXIPIME) 2 g in sodium chloride 0.9 % 100 mL IVPB        2 g 200 mL/hr over 30 Minutes Intravenous  Once 09/07/20 1955 09/07/20 2047   09/07/20 2000  metroNIDAZOLE (FLAGYL) IVPB 500 mg        500 mg 100 mL/hr over 60 Minutes Intravenous  Once 09/07/20 1955 09/07/20 2152   09/07/20 2000  vancomycin (VANCOCIN) IVPB 1000  mg/200 mL premix        1,000 mg 200 mL/hr over 60 Minutes Intravenous  Once 09/07/20 1955 09/07/20 2212        Micro    Objective   Vitals:   09/08/20 1230 09/08/20 1243 09/08/20 1316 09/08/20 1643  BP: (!) 161/45 (!) 161/45 (!) 170/65 (!) 143/67  Pulse: (!) 57 (!) 58 62 67  Resp: '18 15 16   '$ Temp:  97.7 F (36.5 C) 97.8 F (36.6 C) 97.9 F (36.6 C)  TempSrc:  Oral  Oral  SpO2: 100% 100% 98% 100%  Weight:      Height:        Intake/Output Summary (Last 24 hours) at 09/08/2020 1753 Last data filed at 09/08/2020 [id number] Gross per 24 hour  Intake 1425 ml  Output -  Net 1425 ml   Filed Weights   09/07/20 1908 09/07/20 1923  Weight: 72 kg 64 kg    Physical Exam:  General exam: awake, alert, no acute distress, frail Respiratory system: CTAB, no wheezes, rales or rhonchi, normal respiratory effort. Cardiovascular system: normal S1/S2, RRR, no JVD, murmurs, rubs, gallops, no pedal edema.   Gastrointestinal system: soft, NT, ND, no HSM felt, +bowel sounds. Central nervous system: exam limited by pt not following commands, some delay in speech Psychiatry: normal mood, congruent affect, judgement and insight appear normal  Labs   Data Reviewed: I have personally reviewed following labs and imaging studies  CBC: Recent Labs  Lab 09/07/20 2057 09/08/20 0325  WBC 10.1 8.5  NEUTROABS 7.5  --   HGB 11.0* 11.7*  HCT 38.2 39.6  MCV 87.2 83.7  PLT 131* [id number]*   Basic Metabolic Panel: Recent Labs  Lab 09/07/20 2057 09/08/20 0325 09/08/20 1130 09/08/20 1354  NA 149* 151* 147* 146*  K 3.7 4.1 3.5 3.8  CL 115* 115* 115* 116*  CO2 18* 24 24 19*  GLUCOSE 171* 156* 212* 210*  BUN 37* 38* 39* 36*  CREATININE 3.28* 3.29* 3.19* 3.22*  CALCIUM 13.7* 14.3* 13.8* 13.5*  MG 1.8  --   --   --   PHOS  --  4.4  --   --    GFR: Estimated Creatinine Clearance: 9.9 mL/min (A) (by C-G formula based on SCr of 3.22 mg/dL (H)). Liver Function Tests: Recent Labs  Lab 09/07/20 2057  09/08/20 0325  AST 29 27  ALT 10 10  ALKPHOS 91 94  BILITOT 1.4* 0.9  PROT 6.3* 6.7  ALBUMIN 3.8 4.0   No results for input(s): LIPASE, AMYLASE in  the last 168 hours. No results for input(s): AMMONIA in the last 168 hours. Coagulation Profile: Recent Labs  Lab 09/08/20 0325  INR 1.2   Cardiac Enzymes: Recent Labs  Lab 09/08/20 0325  CKTOTAL 42   BNP (last 3 results) No results for input(s): PROBNP in the last 8760 hours. HbA1C: No results for input(s): HGBA1C in the last 72 hours. CBG: No results for input(s): GLUCAP in the last 168 hours. Lipid Profile: No results for input(s): CHOL, HDL, LDLCALC, TRIG, CHOLHDL, LDLDIRECT in the last 72 hours. Thyroid Function Tests: No results for input(s): TSH, T4TOTAL, FREET4, T3FREE, THYROIDAB in the last 72 hours. Anemia Panel: No results for input(s): VITAMINB12, FOLATE, FERRITIN, TIBC, IRON, RETICCTPCT in the last 72 hours. Sepsis Labs: Recent Labs  Lab 09/07/20 1903 09/07/20 2250 09/08/20 0325  PROCALCITON  --   --  2.43  LATICACIDVEN 4.9* 2.7*  --     Recent Results (from the past 240 hour(s))  Blood culture (routine x 2)     Status: None (Preliminary result)   Collection Time: 09/07/20  7:03 PM   Specimen: BLOOD  Result Value Ref Range Status   Specimen Description BLOOD RIGHT ANTECUBITAL  Final   Special Requests   Final    BOTTLES DRAWN AEROBIC AND ANAEROBIC Blood Culture results may not be optimal due to an inadequate volume of blood received in culture bottles   Culture   Final    NO GROWTH < 12 HOURS Performed at [facility], [address], [place], [place] [address]    Report Status PENDING  Incomplete  Resp Panel by RT-PCR (Flu A&B, Covid) Nasopharyngeal Swab     Status: None   Collection Time: 09/07/20  7:48 PM   Specimen: Nasopharyngeal Swab; Nasopharyngeal(NP) swabs in vial transport medium  Result Value Ref Range Status   SARS Coronavirus 2 by RT PCR NEGATIVE NEGATIVE Final    Comment:  (NOTE) SARS-CoV-2 target nucleic acids are NOT DETECTED.  The SARS-CoV-2 RNA is generally detectable in upper respiratory specimens during the acute phase of infection. The lowest concentration of SARS-CoV-2 viral copies this assay can detect is 138 copies/mL. A negative result does not preclude SARS-Cov-2 infection and should not be used as the sole basis for treatment or other patient management decisions. A negative result may occur with  improper specimen collection/handling, submission of specimen other than nasopharyngeal swab, presence of viral mutation(s) within the areas targeted by this assay, and inadequate number of viral copies(<138 copies/mL). A negative result must be combined with clinical observations, patient history, and epidemiological information. The expected result is Negative.  Fact Sheet for Patients:  EntrepreneurPulse.com.au  Fact Sheet for Healthcare Providers:  IncredibleEmployment.be  This test is no t yet approved or cleared by the [place] FDA and  has been authorized for detection and/or diagnosis of SARS-CoV-2 by FDA under an Emergency Use Authorization (EUA). This EUA will remain  in effect (meaning this test can be used) for the duration of the COVID-19 declaration under Section 564(b)(1) of the Act, 21 U.S.C.section 360bbb-3(b)(1), unless the authorization is terminated  or revoked sooner.       Influenza A by PCR NEGATIVE NEGATIVE Final   Influenza B by PCR NEGATIVE NEGATIVE Final    Comment: (NOTE) The Xpert Xpress SARS-CoV-2/FLU/RSV plus assay is intended as an aid in the diagnosis of influenza from Nasopharyngeal swab specimens and should not be used as a sole basis for treatment. Nasal washings and aspirates are unacceptable for Xpert  Xpress SARS-CoV-2/FLU/RSV testing.  Fact Sheet for Patients: EntrepreneurPulse.com.au  Fact Sheet for Healthcare  Providers: IncredibleEmployment.be  This test is not yet approved or cleared by the [place] FDA and has been authorized for detection and/or diagnosis of SARS-CoV-2 by FDA under an Emergency Use Authorization (EUA). This EUA will remain in effect (meaning this test can be used) for the duration of the COVID-19 declaration under Section 564(b)(1) of the Act, 21 U.S.C. section 360bbb-3(b)(1), unless the authorization is terminated or revoked.  Performed at [facility], [address] [place], [place] [address]   Culture, blood (Routine X 2) w Reflex to ID Panel     Status: None (Preliminary result)   Collection Time: 09/08/20  3:25 AM   Specimen: BLOOD  Result Value Ref Range Status   Specimen Description BLOOD RIGHT FA  Final   Special Requests   Final    BOTTLES DRAWN AEROBIC AND ANAEROBIC Blood Culture adequate volume   Culture   Final    NO GROWTH <12 HOURS Performed at [facility], [address] [place], [place] [address]    Report Status PENDING  Incomplete      Imaging Studies   CT Head Wo Contrast  Result Date: 09/07/2020 CLINICAL DATA:  Unresponsive, hypotensive, altered level of consciousness EXAM: CT HEAD WITHOUT CONTRAST TECHNIQUE: Contiguous axial images were obtained from the base of the skull through the vertex without intravenous contrast. COMPARISON:  07/12/2020 FINDINGS: Brain: No acute infarct or hemorrhage. Lateral ventricles and midline structures are unremarkable. No acute extra-axial fluid collections. No mass effect. Vascular: No hyperdense vessel or unexpected calcification. Skull: Mottled lucencies throughout the calvarium are unchanged since prior study. No acute displaced fractures. Sinuses/Orbits: No acute finding. Other: None. IMPRESSION: 1. No acute intracranial process. 2. Stable mottled lucency throughout the calvarium, consistent with chronic demineralization, anemia, or marrow replacement process.  Electronically Signed   By: Randa Ngo M.D.   On: 09/07/2020 20:05   US RENAL  Result Date: 09/08/2020 CLINICAL DATA:  Acute kidney injury history hypertension EXAM: RENAL / URINARY TRACT ULTRASOUND COMPLETE COMPARISON:  CT abdomen and pelvis 08/16/2020 FINDINGS: Right Kidney: Renal measurements: 9.0 x 4.0 x 4.1 cm = volume: 77 mL. Normal cortical thickness. Borderline increased cortical echogenicity. No mass, hydronephrosis, or shadowing calcification. Left Kidney: Renal measurements: 6.9 x 4.0 x 4.7 cm = volume: 67 mL. Normal cortical thickness. Slightly increased cortical echogenicity. Small cyst at mid kidney 9 x 10 x 9 mm. No additional mass, hydronephrosis, or shadowing calcification. Bladder: Appears normal for degree of bladder distention. Other: N/A IMPRESSION: Suspected medical renal disease changes of both kidneys. Small LEFT renal cyst 10 mm diameter. No additional renal sonographic abnormalities. Electronically Signed   By: Lavonia Dana M.D.   On: 09/08/2020 08:53   DG Chest Portable 1 View  Result Date: 09/07/2020 CLINICAL DATA:  87 year old female with altered mental status. EXAM: PORTABLE CHEST 1 VIEW COMPARISON:  Chest radiograph dated 07/12/2020. FINDINGS: The lungs are clear. There is no pleural effusion pneumothorax. The cardiac silhouette is within limits. No acute osseous pathology. Osteopenia. Degenerative changes of the spine. IMPRESSION: No active disease. Electronically Signed   By: Anner Crete M.D.   On: 09/07/2020 19:59     Medications   Scheduled Meds: . sodium chloride flush  3 mL Intravenous Q12H   Continuous Infusions:     LOS: 0 days    Time spent: 40 minutes with > 50 % spent in coordination of care and  in direct patient contact.    Ezekiel Slocumb, DO [facility]  09/08/2020, 5:53 PM      If 7PM-7AM, please contact night-coverage. How to contact the [facility] Attending or Consulting provider [place] or covering provider during after  hours [place], for this patient?    1. Check the care team in [facility] and look for a) attending/consulting [facility] provider listed and b) the [facility] team listed 2. Log into [email or web address] and use [facility]'s universal password to access. If you do not have the password, please contact the hospital operator. 3. Locate the [facility] provider you are looking for under [facility] and page to a number that you can be directly reached. 4. If you still have difficulty reaching the provider, please page the [facility] (Director on Call) for the Hospitalists listed on amion for assistance.

## 2020-09-08 NOTE — Progress Notes (Signed)
Pharmacy Antibiotic Note  Shawna Lin is a 87 y.o. female admitted on 09/07/2020 with sepsis from unknown source.  Pharmacy has been consulted for Cefepime and Vancomycin dosing.  Pt w/ AKI superimposed on CKD, but not candidate for HD at this time.    Pt given total LD Vanc 1500 mg base on wt.  SCr 3.29, CrCl 9.7  Plan: Cefepime 1 gm q24h per indication and renal fxn  Due to low/unstable renal function, pharmacy will continue to follow and order additional Vanc doses when warranted.  Height: 5' (152.4 cm) Weight: 64 kg (141 lb 1.5 oz) IBW/kg (Calculated) : 45.5  Temp (24hrs), Avg:97.7 F (36.5 C), Min:97.7 F (36.5 C), Max:97.7 F (36.5 C)  Recent Labs  Lab 09/07/20 1903 09/07/20 2057 09/07/20 2250  WBC  --  10.1  --   CREATININE  --  3.28*  --   LATICACIDVEN 4.9*  --  2.7*    Estimated Creatinine Clearance: 9.7 mL/min (A) (by C-G formula based on SCr of 3.28 mg/dL (H)).    No Known Allergies  Antimicrobials this admission: 2/23 Cefepime >>  2/23 Vancomcyin >>  2/23 Flagyl >> x 1  Microbiology results: 2/24 BCx: Pending  Thank you for allowing pharmacy to be a part of this patient's care.  Renda Rolls, PharmD, [facility] 09/08/2020 4:42 AM

## 2020-09-08 NOTE — ED Notes (Signed)
Drew 2 SST tubes and 1 lavender tube per request from lab. Sent to lab with chart labels.

## 2020-09-08 NOTE — Progress Notes (Signed)
   09/08/20 1400  Clinical Encounter Type  Visited With Patient  Visit Type Initial;Spiritual support  Referral From Nurse  Consult/Referral To Chaplain   PT not available. Nurse stated PT requested a Jehovah Witness Chaplain. There are no Jehovah Witness Chaplains on staff.

## 2020-09-08 NOTE — Progress Notes (Signed)
Pharmacy Antibiotic Note  Shawna Lin is a 87 y.o. female admitted on 09/07/2020 with sepsis from unknown source.  Pharmacy has been consulted for Cefepime and Vancomycin dosing.  Pt w/ AKI superimposed on CKD, but not candidate for HD at this time.    Pt given total LD Vanc 1500 mg base on wt.  SCr 3.29, CrCl 9.7  Plan: Cefepime 1 gm q24h per indication and renal fxn Given current renal function, anticipate Q48H vancomycin regimen  Due to low/unstable renal function, pharmacy will continue to follow and order additional Vanc doses when warranted.  Height: 5' (152.4 cm) Weight: 64 kg (141 lb 1.5 oz) IBW/kg (Calculated) : 45.5  Temp (24hrs), Avg:97.8 F (36.6 C), Min:97.7 F (36.5 C), Max:97.9 F (36.6 C)  Recent Labs  Lab 09/07/20 1903 09/07/20 2057 09/07/20 2250 09/08/20 0325  WBC  --  10.1  --  8.5  CREATININE  --  3.28*  --  3.29*  LATICACIDVEN 4.9*  --  2.7*  --     Estimated Creatinine Clearance: 9.7 mL/min (A) (by C-G formula based on SCr of 3.29 mg/dL (H)).    No Known Allergies  Antimicrobials this admission: 2/23 Cefepime >>  2/23 Vancomcyin >>  2/23 Flagyl >> x 1  Microbiology results: 2/24 BCx: Pending  Thank you for allowing pharmacy to be a part of this patient's care.  Dorothe Pea, PharmD, BCPS Clinical Pharmacist  09/08/2020 10:37 AM

## 2020-09-08 NOTE — ED Notes (Signed)
Inpatient RN accepted pt and stated ok to bring pt up.

## 2020-09-08 NOTE — ED Notes (Signed)
Ultrasound at bedside

## 2020-09-08 NOTE — Hospital Course (Signed)
87 y.o. female with past medical history of dementia, ?seizure disorder (per past documentation), HTN, CKD IIIb, hospitalized from 12/28 to 07/22/2020 with an L2 vertebral fracture related to a fall but with finding of severe anemia of 5.2, treated with EPO and IV iron as patient is Jehovah's Witness and does not accept blood transfusions.  She was discharged back to rehab where she has been in her usual state of health until she was found unresponsive on 09/07/20.  On arrival of EMS her SBP was reportedly in the 30s, RR of 8 and arrived to the ED being bagged.  History of immediately preceding events limited.  Evaluation in the ED -  Initial BP 77/53 improved with IV fluid resuscitation.  Afebrile, intermittent tachypnea but otherwise stable vitals.  Labs most notable for acute renal failure with Cr 3.28 (baseline ~1.3), high anion gap 16, hypernatremia 145, lactic acidosis 4.9>>2.7 with IV hydration, critical hypercalcemia 13/7. CT head and CXR negative for acute findings. Treated per sepsis protocol in the ED and admitted for further evaluation and management.

## 2020-09-08 NOTE — H&P (Addendum)
History and Physical    Shawna Lin [id number] DOB: May 26, 1929 DOA: 09/07/2020  PCP: [name]., MD   Patient coming from: nursing home  I have personally briefly reviewed patient's old medical records in [place]  Chief Complaint: unresponsive  HPI: Shawna Lin is a 87 y.o. female with medical history significant for Dementia, ?seizure disorder (per past documentation), HTN, CKD IIIb, hospitalized from 12/28 to 07/22/2020 with an L2 vertebral fracture related to a fall but with finding of severe anemia of 5.2, treated with EPO and IV iron as patient is Jehovah's Witness and does not accept blood transfusions, discharged back to rehab where she has been in her usual state of health until she was found unresponsive in the nursing home.  On arrival of EMS her SBP was apparently in the 30s, respirations 8 and arrived to the ED being bagged.  History of immediately preceding events limited. ED course: On arrival, patient was responsive only to pain, temperature 97.7, BP 77/53, pulse 75, with respirations 12-29, O2 sat 98% on room air.  Venous blood gas was unremarkable.  WBC 10,000, hemoglobin 11 and platelets 131000.  CMP significant for creatinine of 3.28, up from a baseline of around 1.3, with anion gap 16 and bicarb 18 as well as sodium of 145.  Critical calcium of 13.7.  First troponin 4.9 improving to 2.7 with hydration.  Urinalysis unavailable.  Troponin 88.  Magnesium normal at 1.8. EKG: As interpreted by me: Sinus rhythm at 69 Imaging: Chest x-ray no active disease CT head: No acute intracranial process  Patient was treated in the ER for possible sepsis with IV fluid boluses and broad-spectrum antibiotics.  She was also administered calcium gluconate due due to suspicion for hyperkalemia based on her report of patient being ESRD not a candidate for dialysis due to frailty.  By the time of admission there was some improvement in vitals, blood pressure with measures  instituted in the emergency room.  Hospitalist consulted for admission.  Review of Systems: Unable to obtain due to dementia  Past Medical History:  Diagnosis Date  . Hypertension   . Iron deficiency anemia   . Memory loss   . Renal disorder   . Vitamin D deficiency     Past Surgical History:  Procedure Laterality Date  . UTERINE FIBROID SURGERY       reports that she has never smoked. She has never used smokeless tobacco. She reports that she does not drink alcohol and does not use drugs.  No Known Allergies  Family History  Problem Relation Age of Onset  . Heart attack Father   . Hypertension Mother   . Colon cancer Brother   . Hypertension Brother   . Diabetes Sister   . Kidney failure Sister   . Hypertension Sister   . Dementia Neg Hx       Prior to Admission medications   Medication Sig Start Date End Date Taking? Authorizing Provider  acetaminophen (TYLENOL) 325 MG tablet Take 650 mg by mouth every 12 (twelve) hours as needed (knee pain).    Yes [provider]  amLODipine (NORVASC) 10 MG tablet Take 1 tablet (10 mg total) by mouth daily. 07/23/20  Yes Florencia Reasons, MD  LORazepam (ATIVAN) 2 MG/ML injection Inject 0.5 mg into the vein every 12 (twelve) hours as needed.   Yes [provider]  mirtazapine (REMERON SOL-TAB) 15 MG disintegrating tablet Take 7.5 mg by mouth every evening.   Yes [provider]  OLANZapine (ZYPREXA) 2.5 MG tablet Take 2.5 mg by mouth at bedtime.   Yes [provider]  sertraline (ZOLOFT) 100 MG tablet Take 100 mg by mouth daily.   Yes [provider]  Skin Protectants, Misc. (EUCERIN) cream Apply 1 application topically 2 (two) times daily. Apply to bilateral lower extremities.   Yes [provider]  Ensure (ENSURE) Take 237 mLs by mouth 3 (three) times daily.    [provider]    Physical Exam: Vitals:   09/07/20 2230 09/07/20 2300 09/07/20 2330 09/08/20 0000  BP: (!) 153/73  139/89 136/70 (!) 143/87  Pulse: 72 70 71 64  Resp: '16 10 19 11  '$ Temp:      TempSrc:      SpO2: 96% 96% 97% 97%  Weight:      Height:         Vitals:   09/07/20 2230 09/07/20 2300 09/07/20 2330 09/08/20 0000  BP: (!) 153/73 139/89 136/70 (!) 143/87  Pulse: 72 70 71 64  Resp: '16 10 19 11  '$ Temp:      TempSrc:      SpO2: 96% 96% 97% 97%  Weight:      Height:          Constitutional:  Lethargic.  Will arouse but will not answer questions. Not in any apparent distress HEENT:      Head: Normocephalic and atraumatic.         Eyes: PERLA, EOMI, Conjunctivae are normal. Sclera is non-icteric.       Mouth/Throat: Mucous membranes are moist.       Neck: Supple with no signs of meningismus. Cardiovascular: Regular rate and rhythm. No murmurs, gallops, or rubs. 2+ symmetrical distal pulses are present . No JVD. No LE edema Respiratory: Respiratory effort normal .Lungs sounds clear bilaterally. No wheezes, crackles, or rhonchi.  Gastrointestinal: Soft, non tender, and non distended with positive bowel sounds.  Genitourinary: No CVA tenderness. Musculoskeletal: Nontender with normal range of motion in all extremities. No cyanosis, or erythema of extremities. Neurologic:  Face is symmetric. Moving all extremities. No gross focal neurologic deficits . Skin: Skin is warm, dry.  No rash or ulcers Psychiatric: Difficult to assess due to altered mental status  Labs on Admission: I have personally reviewed following labs and imaging studies  CBC: Recent Labs  Lab 09/07/20 2057  WBC 10.1  NEUTROABS 7.5  HGB 11.0*  HCT 38.2  MCV 87.2  PLT [id number]*   Basic Metabolic Panel: Recent Labs  Lab 09/07/20 2057  NA 149*  K 3.7  CL 115*  CO2 18*  GLUCOSE 171*  BUN 37*  CREATININE 3.28*  CALCIUM 13.7*  MG 1.8   GFR: Estimated Creatinine Clearance: 9.7 mL/min (A) (by C-G formula based on SCr of 3.28 mg/dL (H)). Liver Function Tests: Recent Labs  Lab 09/07/20 2057  AST 29  ALT 10   ALKPHOS 91  BILITOT 1.4*  PROT 6.3*  ALBUMIN 3.8   No results for input(s): LIPASE, AMYLASE in the last 168 hours. No results for input(s): AMMONIA in the last 168 hours. Coagulation Profile: No results for input(s): INR, PROTIME in the last 168 hours. Cardiac Enzymes: No results for input(s): CKTOTAL, CKMB, CKMBINDEX, TROPONINI in the last 168 hours. BNP (last 3 results) No results for input(s): PROBNP in the last 8760 hours. HbA1C: No results for input(s): HGBA1C in the last 72 hours. CBG: No results for input(s): GLUCAP in the last 168 hours. Lipid Profile: No  results for input(s): CHOL, HDL, LDLCALC, TRIG, CHOLHDL, LDLDIRECT in the last 72 hours. Thyroid Function Tests: No results for input(s): TSH, T4TOTAL, FREET4, T3FREE, THYROIDAB in the last 72 hours. Anemia Panel: No results for input(s): VITAMINB12, FOLATE, FERRITIN, TIBC, IRON, RETICCTPCT in the last 72 hours. Urine analysis:    Component Value Date/Time   COLORURINE STRAW (A) 07/12/2020 1606   APPEARANCEUR CLEAR 07/12/2020 1606   LABSPEC 1.011 07/12/2020 1606   PHURINE 6.0 07/12/2020 1606   GLUCOSEU NEGATIVE 07/12/2020 1606   HGBUR NEGATIVE 07/12/2020 1606   BILIRUBINUR NEGATIVE 07/12/2020 [place] 07/12/2020 1606   PROTEINUR 30 (A) 07/12/2020 1606   NITRITE NEGATIVE 07/12/2020 1606   LEUKOCYTESUR NEGATIVE 07/12/2020 1606    Radiological Exams on Admission: CT Head Wo Contrast  Result Date: 09/07/2020 CLINICAL DATA:  Unresponsive, hypotensive, altered level of consciousness EXAM: CT HEAD WITHOUT CONTRAST TECHNIQUE: Contiguous axial images were obtained from the base of the skull through the vertex without intravenous contrast. COMPARISON:  07/12/2020 FINDINGS: Brain: No acute infarct or hemorrhage. Lateral ventricles and midline structures are unremarkable. No acute extra-axial fluid collections. No mass effect. Vascular: No hyperdense vessel or unexpected calcification. Skull: Mottled  lucencies throughout the calvarium are unchanged since prior study. No acute displaced fractures. Sinuses/Orbits: No acute finding. Other: None. IMPRESSION: 1. No acute intracranial process. 2. Stable mottled lucency throughout the calvarium, consistent with chronic demineralization, anemia, or marrow replacement process. Electronically Signed   By: Randa Ngo M.D.   On: 09/07/2020 20:05   DG Chest Portable 1 View  Result Date: 09/07/2020 CLINICAL DATA:  87 year old female with altered mental status. EXAM: PORTABLE CHEST 1 VIEW COMPARISON:  Chest radiograph dated 07/12/2020. FINDINGS: The lungs are clear. There is no pleural effusion pneumothorax. The cardiac silhouette is within limits. No acute osseous pathology. Osteopenia. Degenerative changes of the spine. IMPRESSION: No active disease. Electronically Signed   By: Anner Crete M.D.   On: 09/07/2020 19:59     Assessment/Plan 87 year old female with history of dementia, seizure disorder, HTN, CKD IIIb, hospitalized from 12/28- 07/22/2020 with an L2 vertebral fracture related to a fall but with finding of severe anemia of 5.2, treated with EPO and IV iron as she does not accept blood transfusions, discharged back to rehab where she has been in her usual state of health brought in after she was found unresponsive at the nursing home.  Patient arrived to being bagged with EMS report with SBP in the 30s and respirations of 8.     Severe sepsis/ septic shock(HCC) -Patient arrived unresponsive, respiratory failure with EMS being bagged, BP 77/53, pulse 75, T 97.7, WBC 10,000 with lactic acid 4.9>2.7 -BP was fluid responsive in the emergency room with improvement to 136/70 by admission -Chest x-ray clear, urinalysis pending, CT head nonacute -We will treat as severe sepsis/shock of uncertain source -Completed IV fluid bolus in the emergency room -Continue maintenance fluid -Continue IV vancomycin, cefepime and Flagyl for sepsis of unknown  source -Follow cultures -N.p.o. for now with aspiration precautions    Acute metabolic encephalopathy/unresponsiveness -If sepsis ruled out differential includes severe dehydration from poor oral intake, given hyponatremia and AKI, as well as possible postictal state in view of history of seizure disorder not on antiepileptics . -Keep n.p.o. until more alert -Fall and aspiration precautions    Seizure disorder ([place]) -Past documentation of seizure disorder but patient not on antiepileptics -Ativan as needed seizure -EEG -Consider neurology consult    Acute kidney injury superimposed  on CKD IIIb (HCC)   High anion gap metabolic acidosis -Creatinine 3.28, up from a baseline of 1.3, bicarb 18 -Anion gap of 16 suspect related to lactic acidosis of 4.9 -Likely secondary to dehydration. -No reported history of recent gastrointestinal illness    Hypotension -Related to suspected sepsis -Hold home antihypertensives    Hypercalcemia -Calcium was 13.7 but patient reportedly got calcium gluconate in the ER for suspected hyperkalemia when per verbal report, blood was hemolyzed -Patient did have mild prior hypercalcemia of 10.6 that had been previously stable so may be authentic and not related to administered calcium gluconate -We will repeat and continue to monitor   Hypernatremia -Secondary to dehydration.  Suspecting poor oral intake from an acute illness/sepsis -Continue IV hydration and the treatment of sepsis -Monitor serum sodium    Elevated troponin -Troponin elevation of 88 >118, EKG nonacute -Suspect supply demand mismatch related to sepsis -Very low suspicion for ACS -Continue to monitor    Anemia -Hemoglobin of 11 which is stable -Patient was recently treated with EPO and IV iron for severe anemia of 5.2.  Does not accept blood transfusions -Low suspicion for acute anemia -Continue to monitor  Dementia -Hold home sertraline, Remeron and Zyprexa until more awake and  alert    DVT prophylaxis: Lovenox  Code Status: DNR per documentation of ED provider who spoke with family earlier Family Communication:  none  Disposition Plan: Back to previous home environment Consults called: none  Status:At the time of admission, it appears that the appropriate admission status for this patient is INPATIENT. This is judged to be reasonable and necessary in order to provide the required intensity of service to ensure the patient's safety given the presenting symptoms, physical exam findings, and initial radiographic and laboratory data in the context of their  Comorbid conditions.   Patient requires inpatient status due to high intensity of service, high risk for further deterioration and high frequency of surveillance required.   I certify that at the point of admission it is my clinical judgment that the patient will require inpatient hospital care spanning beyond [place] MD [facility]     09/08/2020, 12:20 AM

## 2020-09-08 NOTE — Progress Notes (Signed)
[place] Room [facility]) Hospital Liaison RN note:  This patient is currently followed by out patient palliative care with Manufacturing engineer at [employer]. Awanya Caesar, TOC is aware. [place] Liaison will follow for disposition.   Thank you.  Zandra Abts, RN [facility] Liaison [phone]

## 2020-09-08 NOTE — ED Notes (Signed)
Lab called. They need 2 SST tube and 1 lavendar blood tube sent. Will draw.

## 2020-09-08 NOTE — Consult Note (Signed)
Consultation Note Date: 09/08/2020   Patient Name: Shawna Lin  DOB: 03/22/1929  MRN: [id number]  Age / Sex: 87 y.o., female  PCP: [name]., MD Referring Physician: Athena Masse, MD  Reason for Consultation: Establishing goals of care  HPI/Patient Profile:   Clinical Assessment and Goals of Care: Patient is resting in bed asleep. Legal guardian [name] is at bedside. [name] discusses that her aunt was not married and had no children. She has siblings who are frail and elderly. She states she is court appointed guardian for [employer].   She discusses her diagnosis of dementia. She discusses her rapid decline since her spine fracture. 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 She discusses that she has not been eating and drinking, and she has been driving from [place] every day to [place] to help improve her oral intake, but she states this has not helped. She is concerned for her QOL.  We discussed her diagnoses which [name] is able to articulate and discuss well, prognosis, GOC, EOL wishes disposition and options.  A detailed discussion was had today regarding advanced directives.  Concepts specific to code status, artifical feeding and hydration, IV antibiotics and rehospitalization were discussed.  The difference between an aggressive medical intervention path and a comfort care path was discussed.  Values and goals of care important to patient and family were attempted to be elicited.  Discussed limitations of medical interventions to prolong quality of life in some situations and discussed the concept of human mortality.  She would like to shift patient to full comfort care. She states she wants to move her to the hospice facility for what time she has left.   Copy of legal guardian papers placed in chart. Toya's identification is confirmed. I completed a  MOST form today with [name] and the signed original was placed in the chart. A photocopy was placed in the chart to be scanned into EMR. The patient outlined their wishes for the following treatment decisions:  Cardiopulmonary Resuscitation: Do Not Attempt Resuscitation (DNR/No CPR)  Medical Interventions: Comfort Measures: Keep clean, warm, and dry. Use medication by any route, positioning, wound care, and other measures to relieve pain and suffering. Use oxygen, suction and manual treatment of airway obstruction as needed for comfort. Do not transfer to the hospital unless comfort needs cannot be met in current location.  Antibiotics: No antibiotics (use other measures to relieve symptoms)  IV Fluids: No IV fluids (provide other measures to ensure comfort)  Feeding Tube: No feeding tube       SUMMARY OF RECOMMENDATIONS   Comfort care. Hospice facility placement.   Prognosis:   < 2 weeks       Primary Diagnoses: Present on Admission: . HTN (hypertension) . Anemia   I have reviewed the medical record, interviewed the patient and family, and examined the patient. The following aspects are pertinent.  Past Medical History:  Diagnosis Date  . Hypertension   . Iron deficiency anemia   .  Memory loss   . Renal disorder   . Vitamin D deficiency    Social History   Socioeconomic History  . Marital status: Single    Spouse name: Not on file  . Number of children: 0  . Years of education: 17  . Highest education level: Not on file  Occupational History  . Occupation: Retired  Tobacco Use  . Smoking status: Never Smoker  . Smokeless tobacco: Never Used  Vaping Use  . Vaping Use: Never used  Substance and Sexual Activity  . Alcohol use: No    Alcohol/week: 0.0 standard drinks  . Drug use: No  . Sexual activity: Never  Other Topics Concern  . Not on file  Social History Narrative   Lives in memory care at [facility]    Right handed   Caffeine: tea once in awhile     Social Determinants of Health   Financial Resource Strain: Not on file  Food Insecurity: Not on file  Transportation Needs: Not on file  Physical Activity: Not on file  Stress: Not on file  Social Connections: Not on file   Family History  Problem Relation Age of Onset  . Heart attack Father   . Hypertension Mother   . Colon cancer Brother   . Hypertension Brother   . Diabetes Sister   . Kidney failure Sister   . Hypertension Sister   . Dementia Neg Hx    Scheduled Meds: . enoxaparin (LOVENOX) injection  30 mg Subcutaneous Q24H  . vancomycin variable dose per unstable renal function (pharmacist dosing)   Does not apply See admin instructions   Continuous Infusions: . ceFEPime (MAXIPIME) IV    . dextrose 5 % and 0.45% NaCl 150 mL/hr at 09/08/20 0748   PRN Meds:.acetaminophen **OR** acetaminophen, hydrALAZINE, promethazine Medications Prior to Admission:  Prior to Admission medications   Medication Sig Start Date End Date Taking? Authorizing Provider  acetaminophen (TYLENOL) 325 MG tablet Take 650 mg by mouth every 12 (twelve) hours as needed (knee pain).    Yes [provider]  amLODipine (NORVASC) 10 MG tablet Take 1 tablet (10 mg total) by mouth daily. 07/23/20  Yes Florencia Reasons, MD  LORazepam (ATIVAN) 2 MG/ML injection Inject 0.5 mg into the vein every 12 (twelve) hours as needed.   Yes [provider]  mirtazapine (REMERON SOL-TAB) 15 MG disintegrating tablet Take 7.5 mg by mouth every evening.   Yes [provider]  OLANZapine (ZYPREXA) 2.5 MG tablet Take 2.5 mg by mouth at bedtime.   Yes [provider]  sertraline (ZOLOFT) 100 MG tablet Take 100 mg by mouth daily.   Yes [provider]  Skin Protectants, Misc. (EUCERIN) cream Apply 1 application topically 2 (two) times daily. Apply to bilateral lower extremities.   Yes [provider]  Ensure (ENSURE) Take 237 mLs by mouth 3 (three) times daily.    [provider]   No Known Allergies Review of Systems  Unable to perform ROS   Physical Exam Constitutional:      Comments: Eyes closed.      Vital Signs: BP (!) 170/65   Pulse 62   Temp 97.8 F (36.6 C)   Resp 16   Ht 5' (1.524 m)   Wt 64 kg   SpO2 98%   BMI 27.56 kg/m  Pain Scale: PAINAD   Pain Score: Asleep   SpO2: SpO2: 98 % O2 Device:SpO2: 98 % O2 Flow Rate: .   IO: Intake/output summary:  Intake/Output Summary (Last 24 hours) at 09/08/2020 1538 Last data filed at 09/08/2020 8001 Gross per 24 hour  Intake 1425 ml  Output --  Net 1425 ml    LBM:   Baseline Weight: Weight: 72 kg Most recent weight: Weight: 64 kg         Time In: 2:20 Time Out: 3:10 Time Total: 50 min Greater than 50%  of this time was spent counseling and coordinating care related to the above assessment and plan.  Signed by: Asencion Gowda, NP   Please contact Palliative Medicine Team phone at [phone] for questions and concerns.  For individual provider: See Shea Evans

## 2020-09-08 NOTE — Progress Notes (Addendum)
[place] Room [facility]) Hospital Liaison RN note:  Received request from Asencion Gowda, NP for family interest in [place]. Chart reviewed and eligibility has been approved. Spoke with niece, [name] to confirm interest and explain services. She verbalized understanding and all questions were answered. [name] will complete registration paperwork via docusign once a room is available. [place] Liaison will follow for room availability. Awanya Caesar, TOC is aware of above information.  Please call with any hospice related questions or concerns.  Thank you for the opportunity to participate in this patient's care.  Zandra Abts, RN [facility] Care Liaison [phone]

## 2020-09-09 LAB — CALCIUM, IONIZED: Calcium, Ionized, Serum: 8.1 mg/dL — ABNORMAL HIGH (ref 4.5–5.6)

## 2020-09-09 MED ORDER — PROMETHAZINE HCL 12.5 MG PO TABS: 6.2500 mg | ORAL_TABLET | Freq: Four times a day (QID) | ORAL | 0 refills | Status: AC | PRN

## 2020-09-09 MED ORDER — ACETAMINOPHEN 325 MG PO TABS: 650.0000 mg | ORAL_TABLET | Freq: Four times a day (QID) | ORAL | Status: AC | PRN

## 2020-09-09 MED ORDER — BIOTENE DRY MOUTH MT LIQD: 15.0000 mL | OROMUCOSAL | Status: AC | PRN

## 2020-09-09 MED ORDER — LORAZEPAM 0.5 MG PO TABS: 0.5000 mg | ORAL_TABLET | ORAL | 0 refills | Status: AC | PRN

## 2020-09-09 MED ORDER — HYDROMORPHONE HCL 1 MG/ML IJ SOLN: 0.2500 mg | INTRAMUSCULAR | 0 refills | Status: AC | PRN

## 2020-09-09 MED ORDER — POLYVINYL ALCOHOL 1.4 % OP SOLN: 1.0000 [drp] | Freq: Four times a day (QID) | OPHTHALMIC | 0 refills | Status: AC | PRN

## 2020-09-09 MED ORDER — HALOPERIDOL 0.5 MG PO TABS: 0.5000 mg | ORAL_TABLET | ORAL | Status: AC | PRN

## 2020-09-09 MED ORDER — GLYCOPYRROLATE 1 MG PO TABS: 1.0000 mg | ORAL_TABLET | ORAL | Status: AC | PRN

## 2020-09-09 MED ORDER — ACETAMINOPHEN 650 MG RE SUPP: 650.0000 mg | Freq: Four times a day (QID) | RECTAL | 0 refills | Status: AC | PRN

## 2020-09-09 MED ORDER — ONDANSETRON 4 MG PO TBDP: 4.0000 mg | ORAL_TABLET | Freq: Four times a day (QID) | ORAL | 0 refills | Status: AC | PRN

## 2020-09-09 NOTE — TOC Transition Note (Signed)
Transition of Care [facility]) - CM/SW Discharge Note   Patient Details  Name: Shawna Lin MRN: [id number] Date of Birth: 1929/01/31  Transition of Care [facility]) CM/SW Contact:  [name], RN Phone Number: 09/09/2020, 1:42 PM   Final next level of care: Other (comment) ([facility])     Patient Goals and CMS Choice Patient states their goals for this hospitalization and ongoing recovery are:: To go to Hospice Home.      Discharge Placement                Patient to be transferred to facility by: [facility] Name of family member notified: Notified by Hospice staff Patient and family notified of of transfer: 09/09/20  Discharge Plan and Services                DME Arranged: Hospice Equipment Package A   Date DME Agency Contacted: 09/09/20 Time DME Agency Contacted: [phone] Representative spoke with at DME Agency: Zandra Abts HH Arranged: NA [place] Agency: NA        Social Determinants of Health ([place]) Interventions     Readmission Risk Interventions No flowsheet data found.

## 2020-09-09 NOTE — Progress Notes (Addendum)
Report called to Hospice Home/ pts niece, [name] is aware of transfer/ iv removed/ pt resting comfortably in bed/ EMS to transport

## 2020-09-09 NOTE — Discharge Summary (Signed)
Physician Discharge Summary  Shawna Lin WEX:[id number] DOB: 1929-07-14 DOA: 09/07/2020  PCP: [name]., MD  Admit date: 09/07/2020 Discharge date: 09/09/2020  Admitted From: home Disposition:  hospice  Recommendations for Outpatient Follow-up: none - any necessary follow ups per hospice team   Home Health: n/a  Equipment/Devices: n/a   Discharge Condition: stable  CODE STATUS: DNR  Diet recommendation: dysphagia 2   Discharge Diagnoses: Principal Problem:   Severe sepsis/ septic shock(HCC) Active Problems:   HTN (hypertension)   Seizure disorder (HCC)   Anemia   Acute kidney injury superimposed on CKD IIIb (HCC)   Hypercalcemia   Hypernatremia   Acute metabolic encephalopathy   High anion gap metabolic acidosis   Hypotension   Unresponsiveness   Elevated troponin    Summary of HPI and Hospital Course:  87 y.o. female with past medical history of dementia, ?seizure disorder (per past documentation), HTN, CKD IIIb, hospitalized from 12/28 to 07/22/2020 with an L2 vertebral fracture related to a fall but with finding of severe anemia of 5.2, treated with EPO and IV iron as patient is [employer]'s [employer] and does not accept blood transfusions.  She was discharged back to rehab where she has been in her usual state of health until she was found unresponsive on 09/07/20.  On arrival of EMS her SBP was reportedly in the 30s, RR of 8 and arrived to the ED being bagged.  History of immediately preceding events limited.  Evaluation in the ED -  Initial BP 77/53 improved with IV fluid resuscitation.  Afebrile, intermittent tachypnea but otherwise stable vitals.  Labs most notable for acute renal failure with Cr 3.28 (baseline ~1.3), high anion gap 16, hypernatremia 145, lactic acidosis 4.9>>2.7 with IV hydration, critical hypercalcemia 13/7. CT head and CXR negative for acute findings. Treated per sepsis protocol in the ED and admitted for further evaluation and management.    Family met with palliative care given patient's recent rapid decline and progressive dementia with poor PO intake.  Patient had been at [facility] for rehab without improvement.  Family expressed concern of patient's declining quality of life and made the decision to transition to full comfort care.  2/24 - transitioned to comfort care 2/25 - discharge to hospice facility   Discharge Instructions   Discharge Instructions    Call MD for:  persistant nausea and vomiting   Complete by: As directed    Call MD for:  severe uncontrolled pain   Complete by: As directed    Diet - low sodium heart healthy   Complete by: As directed    Increase activity slowly   Complete by: As directed      Allergies as of 09/09/2020   No Known Allergies     Medication List    STOP taking these medications   amLODipine 10 MG tablet Commonly known as: NORVASC   Ensure   eucerin cream   LORazepam 2 MG/ML injection Commonly known as: ATIVAN Replaced by: LORazepam 0.5 MG tablet   OLANZapine 2.5 MG tablet Commonly known as: ZYPREXA   sertraline 100 MG tablet Commonly known as: ZOLOFT     TAKE these medications   acetaminophen 325 MG tablet Commonly known as: TYLENOL Take 2 tablets (650 mg total) by mouth every 6 (six) hours as needed for mild pain (or Fever >/= 101). What changed:   when to take this  reasons to take this   acetaminophen 650 MG suppository Commonly known as: TYLENOL Place 1 suppository (650  mg total) rectally every 6 (six) hours as needed for mild pain (or Fever >/= 101). What changed: You were already taking a medication with the same name, and this prescription was added. Make sure you understand how and when to take each.   antiseptic oral rinse Liqd Apply 15 mLs topically as needed for dry mouth.   glycopyrrolate 1 MG tablet Commonly known as: ROBINUL Take 1 tablet (1 mg total) by mouth every 4 (four) hours as needed (excessive secretions).   haloperidol 0.5 MG  tablet Commonly known as: HALDOL Take 1 tablet (0.5 mg total) by mouth every 4 (four) hours as needed for agitation (or delirium).   HYDROmorphone 1 MG/ML injection Commonly known as: DILAUDID Inject 0.25-0.5 mLs (0.25-0.5 mg total) into the vein every 2 (two) hours as needed for severe pain or moderate pain (0.13m for moderate pain or dyspnea, 0.511mfor severe pain or dsypnea.).   LORazepam 0.5 MG tablet Commonly known as: ATIVAN Take 1 tablet (0.5 mg total) by mouth every 4 (four) hours as needed for anxiety. Replaces: LORazepam 2 MG/ML injection   mirtazapine 15 MG disintegrating tablet Commonly known as: REMERON SOL-TAB Take 7.5 mg by mouth every evening.   ondansetron 4 MG disintegrating tablet Commonly known as: ZOFRAN-ODT Take 1 tablet (4 mg total) by mouth every 6 (six) hours as needed for nausea.   polyvinyl alcohol 1.4 % ophthalmic solution Commonly known as: LIQUIFILM TEARS Place 1 drop into both eyes 4 (four) times daily as needed for dry eyes.   promethazine 12.5 MG tablet Commonly known as: PHENERGAN Take 0.5 tablets (6.25 mg total) by mouth every 6 (six) hours as needed for refractory nausea / vomiting.       No Known Allergies   If you experience worsening of your admission symptoms, develop shortness of breath, life threatening emergency, suicidal or homicidal thoughts you must seek medical attention immediately by calling 911 or calling your MD immediately  if symptoms less severe.    Please note   You were cared for by a hospitalist during your hospital stay. If you have any questions about your discharge medications or the care you received while you were in the hospital after you are discharged, you can call the unit and asked to speak with the hospitalist on call if the hospitalist that took care of you is not available. Once you are discharged, your primary care physician will handle any further medical issues. Please note that NO REFILLS for any  discharge medications will be authorized once you are discharged, as it is imperative that you return to your primary care physician (or establish a relationship with a primary care physician if you do not have one) for your aftercare needs so that they can reassess your need for medications and monitor your lab values.   Consultations:  Palliative    Procedures/Studies: CT Head Wo Contrast  Result Date: 09/07/2020 CLINICAL DATA:  Unresponsive, hypotensive, altered level of consciousness EXAM: CT HEAD WITHOUT CONTRAST TECHNIQUE: Contiguous axial images were obtained from the base of the skull through the vertex without intravenous contrast. COMPARISON:  07/12/2020 FINDINGS: Brain: No acute infarct or hemorrhage. Lateral ventricles and midline structures are unremarkable. No acute extra-axial fluid collections. No mass effect. Vascular: No hyperdense vessel or unexpected calcification. Skull: Mottled lucencies throughout the calvarium are unchanged since prior study. No acute displaced fractures. Sinuses/Orbits: No acute finding. Other: None. IMPRESSION: 1. No acute intracranial process. 2. Stable mottled lucency throughout the calvarium, consistent with chronic  demineralization, anemia, or marrow replacement process. Electronically Signed   By: Randa Ngo M.D.   On: 09/07/2020 20:05   US RENAL  Result Date: 09/08/2020 CLINICAL DATA:  Acute kidney injury history hypertension EXAM: RENAL / URINARY TRACT ULTRASOUND COMPLETE COMPARISON:  CT abdomen and pelvis 08/16/2020 FINDINGS: Right Kidney: Renal measurements: 9.0 x 4.0 x 4.1 cm = volume: 77 mL. Normal cortical thickness. Borderline increased cortical echogenicity. No mass, hydronephrosis, or shadowing calcification. Left Kidney: Renal measurements: 6.9 x 4.0 x 4.7 cm = volume: 67 mL. Normal cortical thickness. Slightly increased cortical echogenicity. Small cyst at mid kidney 9 x 10 x 9 mm. No additional mass, hydronephrosis, or shadowing  calcification. Bladder: Appears normal for degree of bladder distention. Other: N/A IMPRESSION: Suspected medical renal disease changes of both kidneys. Small LEFT renal cyst 10 mm diameter. No additional renal sonographic abnormalities. Electronically Signed   By: Lavonia Dana M.D.   On: 09/08/2020 08:53   DG Chest Portable 1 View  Result Date: 09/07/2020 CLINICAL DATA:  87 year old female with altered mental status. EXAM: PORTABLE CHEST 1 VIEW COMPARISON:  Chest radiograph dated 07/12/2020. FINDINGS: The lungs are clear. There is no pleural effusion pneumothorax. The cardiac silhouette is within limits. No acute osseous pathology. Osteopenia. Degenerative changes of the spine. IMPRESSION: No active disease. Electronically Signed   By: Anner Crete M.D.   On: 09/07/2020 19:59   CT Renal Stone Study  Result Date: 08/16/2020 CLINICAL DATA:  Acute renal failure EXAM: CT ABDOMEN AND PELVIS WITHOUT CONTRAST TECHNIQUE: Multidetector CT imaging of the abdomen and pelvis was performed following the standard protocol without IV contrast. COMPARISON:  None. FINDINGS: Lower chest: No acute finding. Coronary atherosclerosis. Mild scarring in the right lower lobe. Hepatobiliary: Simple central hepatic cyst.High-density in the dependent gallbladder could be calculi or sludge. No evidence of cholecystitis. No bile duct dilatation. Pancreas: Unremarkable. Spleen: Unremarkable. Adrenals/Urinary Tract: Negative adrenals. No hydronephrosis or stone. Unremarkable bladder. Stomach/Bowel:  No obstruction. No appendicitis. Vascular/Lymphatic: No acute vascular abnormality. Diffuse atheromatous calcification. No mass or adenopathy. Reproductive:Hysterectomy. Other: No ascites or pneumoperitoneum. Musculoskeletal: No acute abnormalities. Small simple lipoma in the lower left pectoralis musculature. Severe spinal degeneration. There are chronic L5 pars defects at L5-S1 with anterolisthesis and ankylosis. Advanced foraminal  impingement is seen bilaterally at L3-4 and on the right at L4-5. Nonacute L1 superior endplate fracture with moderate height loss that is similar to CT 07/12/2020. Scarring affecting the rectus abdominal muscles, greater on the right. IMPRESSION: 1. Normal appearance of the kidneys and bladder. 2. Probable gallbladder sludge or calculi. Negative for cholecystitis. 3. Aortic Atherosclerosis (ICD10-I70.0). Electronically Signed   By: Monte Fantasia M.D.   On: 08/16/2020 05:49       Subjective: Pt seen this AM.  Reports she feels fine.  Denies any pain or discomfort.  Breakfast tray untouched, pt says not hungry right now.  No acute events reported.   Discharge Exam: Vitals:   09/08/20 1948 09/09/20 0816  BP: (!) 157/100 (!) 148/74  Pulse: 61 (!) 53  Resp: 16 16  Temp: 98.6 F (37 C) 97.6 F (36.4 C)  SpO2: 100% 99%   Vitals:   09/08/20 1316 09/08/20 1643 09/08/20 1948 09/09/20 0816  BP: (!) 170/65 (!) 143/67 (!) 157/100 (!) 148/74  Pulse: 62 67 61 (!) 53  Resp: _0 Temp: 97.8 F (36.6 C) 97.9 F (36.6 C) 98.6 F (37 C) 97.6 F (36.4 C)  TempSrc:  Oral  Oral  SpO2: 98% 100% 100% 99%  Weight:      Height:        General: Pt is alert, awake, not in acute distress Cardiovascular: RRR, S1/S2 +, no rubs, no gallops Respiratory: CTA bilaterally, no wheezing, no rhonchi Abdominal: Soft, NT, ND, bowel sounds + Extremities: no edema, no cyanosis    The results of significant diagnostics from this hospitalization (including imaging, microbiology, ancillary and laboratory) are listed below for reference.     Microbiology: Recent Results (from the past 240 hour(s))  Blood culture (routine x 2)     Status: None (Preliminary result)   Collection Time: 09/07/20  7:03 PM   Specimen: BLOOD  Result Value Ref Range Status   Specimen Description BLOOD RIGHT ANTECUBITAL  Final   Special Requests   Final    BOTTLES DRAWN AEROBIC AND ANAEROBIC Blood Culture results may not be  optimal due to an inadequate volume of blood received in culture bottles   Culture   Final    NO GROWTH 2 DAYS Performed at [facility], [address], [place], [place] [address]    Report Status PENDING  Incomplete  Resp Panel by RT-PCR (Flu A&B, Covid) Nasopharyngeal Swab     Status: None   Collection Time: 09/07/20  7:48 PM   Specimen: Nasopharyngeal Swab; Nasopharyngeal(NP) swabs in vial transport medium  Result Value Ref Range Status   SARS Coronavirus 2 by RT PCR NEGATIVE NEGATIVE Final    Comment: (NOTE) SARS-CoV-2 target nucleic acids are NOT DETECTED.  The SARS-CoV-2 RNA is generally detectable in upper respiratory specimens during the acute phase of infection. The lowest concentration of SARS-CoV-2 viral copies this assay can detect is 138 copies/mL. A negative result does not preclude SARS-Cov-2 infection and should not be used as the sole basis for treatment or other patient management decisions. A negative result may occur with  improper specimen collection/handling, submission of specimen other than nasopharyngeal swab, presence of viral mutation(s) within the areas targeted by this assay, and inadequate number of viral copies(<138 copies/mL). A negative result must be combined with clinical observations, patient history, and epidemiological information. The expected result is Negative.  Fact Sheet for Patients:  EntrepreneurPulse.com.au  Fact Sheet for Healthcare Providers:  IncredibleEmployment.be  This test is no t yet approved or cleared by the [place] FDA and  has been authorized for detection and/or diagnosis of SARS-CoV-2 by FDA under an Emergency Use Authorization (EUA). This EUA will remain  in effect (meaning this test can be used) for the duration of the COVID-19 declaration under Section 564(b)(1) of the Act, 21 U.S.C.section 360bbb-3(b)(1), unless the authorization is terminated  or revoked sooner.        Influenza A by PCR NEGATIVE NEGATIVE Final   Influenza B by PCR NEGATIVE NEGATIVE Final    Comment: (NOTE) The Xpert Xpress SARS-CoV-2/FLU/RSV plus assay is intended as an aid in the diagnosis of influenza from Nasopharyngeal swab specimens and should not be used as a sole basis for treatment. Nasal washings and aspirates are unacceptable for Xpert Xpress SARS-CoV-2/FLU/RSV testing.  Fact Sheet for Patients: EntrepreneurPulse.com.au  Fact Sheet for Healthcare Providers: IncredibleEmployment.be  This test is not yet approved or cleared by the [place] FDA and has been authorized for detection and/or diagnosis of SARS-CoV-2 by FDA under an Emergency Use Authorization (EUA). This EUA will remain in effect (meaning this test can be used) for the duration of the COVID-19 declaration under Section 564(b)(1) of the Act, 21 U.S.C.  section 360bbb-3(b)(1), unless the authorization is terminated or revoked.  Performed at [facility], [address] [place], [place] [address]   Culture, blood (Routine X 2) w Reflex to ID Panel     Status: None (Preliminary result)   Collection Time: 09/08/20  3:25 AM   Specimen: BLOOD  Result Value Ref Range Status   Specimen Description BLOOD RIGHT FA  Final   Special Requests   Final    BOTTLES DRAWN AEROBIC AND ANAEROBIC Blood Culture adequate volume   Culture   Final    NO GROWTH 1 DAY Performed at [facility], [address] [place], [place] [address]    Report Status PENDING  Incomplete     Labs: BNP (last 3 results) No results for input(s): BNP in the last 8760 hours. Basic Metabolic Panel: Recent Labs  Lab 09/07/20 2057 09/08/20 0325 09/08/20 1130 09/08/20 1354  NA 149* 151* 147* 146*  K 3.7 4.1 3.5 3.8  CL 115* 115* 115* 116*  CO2 18* 24 24 19*  GLUCOSE 171* 156* 212* 210*  BUN 37* 38* 39* 36*  CREATININE 3.28* 3.29* 3.19* 3.22*  CALCIUM 13.7* 14.3* 13.8*  13.5*  MG 1.8  --   --   --   PHOS  --  4.4  --   --    Liver Function Tests: Recent Labs  Lab 09/07/20 2057 09/08/20 0325  AST 29 27  ALT 10 10  ALKPHOS 91 94  BILITOT 1.4* 0.9  PROT 6.3* 6.7  ALBUMIN 3.8 4.0   No results for input(s): LIPASE, AMYLASE in the last 168 hours. No results for input(s): AMMONIA in the last 168 hours. CBC: Recent Labs  Lab 09/07/20 2057 09/08/20 0325  WBC 10.1 8.5  NEUTROABS 7.5  --   HGB 11.0* 11.7*  HCT 38.2 39.6  MCV 87.2 83.7  PLT 131* 111*   Cardiac Enzymes: Recent Labs  Lab 09/08/20 0325  CKTOTAL 42   BNP: Invalid input(s): POCBNP CBG: No results for input(s): GLUCAP in the last 168 hours. D-Dimer No results for input(s): DDIMER in the last 72 hours. Hgb A1c No results for input(s): HGBA1C in the last 72 hours. Lipid Profile No results for input(s): CHOL, HDL, LDLCALC, TRIG, CHOLHDL, LDLDIRECT in the last 72 hours. Thyroid function studies No results for input(s): TSH, T4TOTAL, T3FREE, THYROIDAB in the last 72 hours.  Invalid input(s): FREET3 Anemia work up No results for input(s): VITAMINB12, FOLATE, FERRITIN, TIBC, IRON, RETICCTPCT in the last 72 hours. Urinalysis    Component Value Date/Time   COLORURINE YELLOW (A) 09/08/2020 0153   APPEARANCEUR CLOUDY (A) 09/08/2020 0153   LABSPEC 1.016 09/08/2020 0153   PHURINE 5.0 09/08/2020 0153   GLUCOSEU 50 (A) 09/08/2020 0153   HGBUR MODERATE (A) 09/08/2020 0153   BILIRUBINUR NEGATIVE 09/08/2020 0153   KETONESUR 5 (A) 09/08/2020 0153   PROTEINUR 100 (A) 09/08/2020 0153   NITRITE NEGATIVE 09/08/2020 0153   LEUKOCYTESUR NEGATIVE 09/08/2020 0153   Sepsis Labs Invalid input(s): PROCALCITONIN,  WBC,  LACTICIDVEN Microbiology Recent Results (from the past 240 hour(s))  Blood culture (routine x 2)     Status: None (Preliminary result)   Collection Time: 09/07/20  7:03 PM   Specimen: BLOOD  Result Value Ref Range Status   Specimen Description BLOOD RIGHT ANTECUBITAL  Final    Special Requests   Final    BOTTLES DRAWN AEROBIC AND ANAEROBIC Blood Culture results may not be optimal due to an inadequate volume of blood  received in culture bottles   Culture   Final    NO GROWTH 2 DAYS Performed at [facility], [address] [place], [place] [address]    Report Status PENDING  Incomplete  Resp Panel by RT-PCR (Flu A&B, Covid) Nasopharyngeal Swab     Status: None   Collection Time: 09/07/20  7:48 PM   Specimen: Nasopharyngeal Swab; Nasopharyngeal(NP) swabs in vial transport medium  Result Value Ref Range Status   SARS Coronavirus 2 by RT PCR NEGATIVE NEGATIVE Final    Comment: (NOTE) SARS-CoV-2 target nucleic acids are NOT DETECTED.  The SARS-CoV-2 RNA is generally detectable in upper respiratory specimens during the acute phase of infection. The lowest concentration of SARS-CoV-2 viral copies this assay can detect is 138 copies/mL. A negative result does not preclude SARS-Cov-2 infection and should not be used as the sole basis for treatment or other patient management decisions. A negative result may occur with  improper specimen collection/handling, submission of specimen other than nasopharyngeal swab, presence of viral mutation(s) within the areas targeted by this assay, and inadequate number of viral copies(<138 copies/mL). A negative result must be combined with clinical observations, patient history, and epidemiological information. The expected result is Negative.  Fact Sheet for Patients:  EntrepreneurPulse.com.au  Fact Sheet for Healthcare Providers:  IncredibleEmployment.be  This test is no t yet approved or cleared by the [place] FDA and  has been authorized for detection and/or diagnosis of SARS-CoV-2 by FDA under an Emergency Use Authorization (EUA). This EUA will remain  in effect (meaning this test can be used) for the duration of the COVID-19 declaration under Section 564(b)(1) of  the Act, 21 U.S.C.section 360bbb-3(b)(1), unless the authorization is terminated  or revoked sooner.       Influenza A by PCR NEGATIVE NEGATIVE Final   Influenza B by PCR NEGATIVE NEGATIVE Final    Comment: (NOTE) The Xpert Xpress SARS-CoV-2/FLU/RSV plus assay is intended as an aid in the diagnosis of influenza from Nasopharyngeal swab specimens and should not be used as a sole basis for treatment. Nasal washings and aspirates are unacceptable for Xpert Xpress SARS-CoV-2/FLU/RSV testing.  Fact Sheet for Patients: EntrepreneurPulse.com.au  Fact Sheet for Healthcare Providers: IncredibleEmployment.be  This test is not yet approved or cleared by the [place] FDA and has been authorized for detection and/or diagnosis of SARS-CoV-2 by FDA under an Emergency Use Authorization (EUA). This EUA will remain in effect (meaning this test can be used) for the duration of the COVID-19 declaration under Section 564(b)(1) of the Act, 21 U.S.C. section 360bbb-3(b)(1), unless the authorization is terminated or revoked.  Performed at [facility], [address] [place], [place] [address]   Culture, blood (Routine X 2) w Reflex to ID Panel     Status: None (Preliminary result)   Collection Time: 09/08/20  3:25 AM   Specimen: BLOOD  Result Value Ref Range Status   Specimen Description BLOOD RIGHT FA  Final   Special Requests   Final    BOTTLES DRAWN AEROBIC AND ANAEROBIC Blood Culture adequate volume   Culture   Final    NO GROWTH 1 DAY Performed at [facility], [address], [place], [place] [address]    Report Status PENDING  Incomplete     Time coordinating discharge: Over 30 minutes  SIGNED:   Ezekiel Slocumb, DO [facility] 09/09/2020, 11:16 AM   If 7PM-7AM, please contact night-coverage [email or web address]

## 2020-09-09 NOTE — Progress Notes (Addendum)
[facility] [place] [facility]) Hospital Liaison RN note:  Spoke with niece, [name] over the phone to provide update. Hospice Home does have a room to offer today. Hospital care team is aware. Registration paperwork has been completed by [place] via docusign today. I have faxed the discharge summary. Transportation has been arranged for 3:30pm.  Please call with any hospice related questions or concerns.  Thank you for the opportunity to participate in this patient's care.  Zandra Abts, RN [facility] Liaison  [phone]

## 2020-09-10 LAB — BLOOD CULTURE ID PANEL (REFLEXED) - BCID2

## 2020-09-12 LAB — PTH, INTACT AND CALCIUM
Calcium, Total (PTH): 14 mg/dL (ref 8.7–10.3)
PTH: 17 pg/mL (ref 15–65)

## 2020-09-13 LAB — CULTURE, BLOOD (ROUTINE X 2)
Culture: NO GROWTH
Special Requests: ADEQUATE

## 2020-09-14 LAB — CULTURE, BLOOD (ROUTINE X 2)

## 2020-10-05 ENCOUNTER — Encounter: Payer: Self-pay | Admitting: Family Medicine

## 2020-10-14 DEATH — deceased

## 2021-03-03 IMAGING — CT CT HEAD W/O CM
3 series · 15 of 45 positions shown, 18 images · non-contrast
Comparison: 07/12/2020

CLINICAL DATA: Unresponsive, hypotensive, altered level of
consciousness

EXAM:
CT HEAD WITHOUT CONTRAST
TECHNIQUE: Contiguous axial images were obtained from the base of the skull
through the vertex without intravenous contrast.

[Series 3: head wo · axial · 0.42mm/px · z∈[-108,+7]mm · 9 of 28 slices shown, 12 images]
[im 3/28  brain]
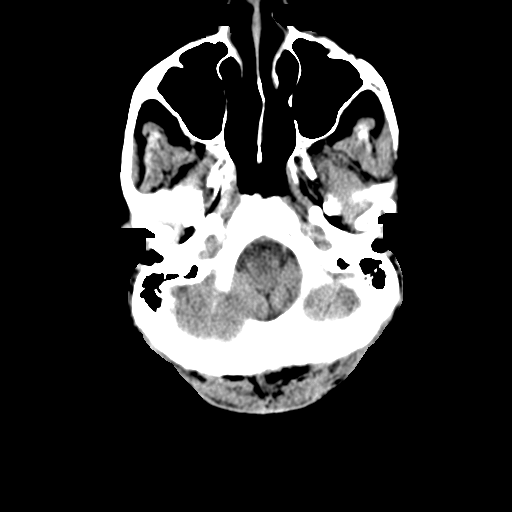
[im 3/28  bone]
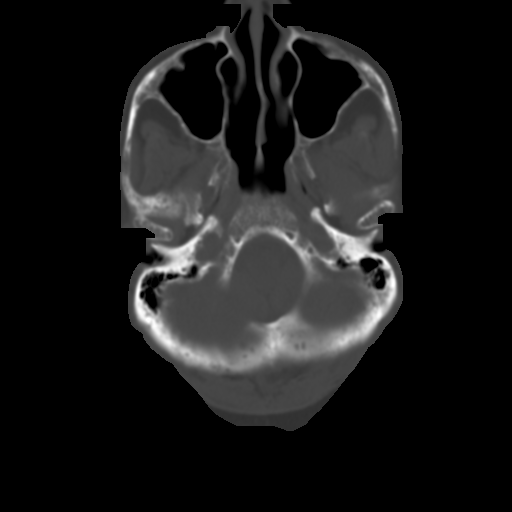
[im 6/28  brain]
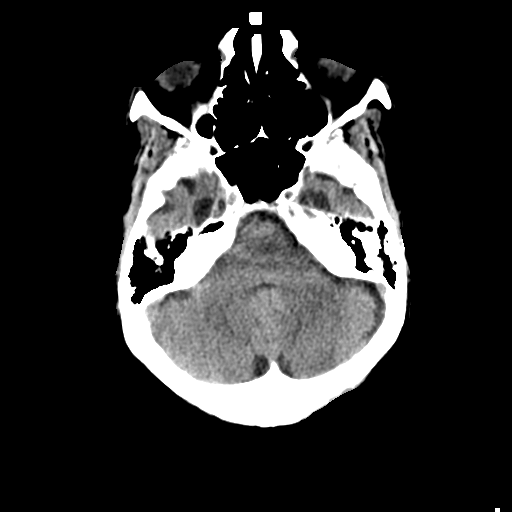
[im 9/28  brain]
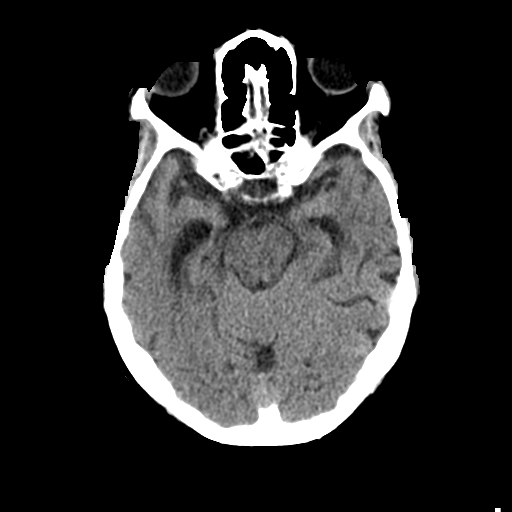
[im 12/28  brain]
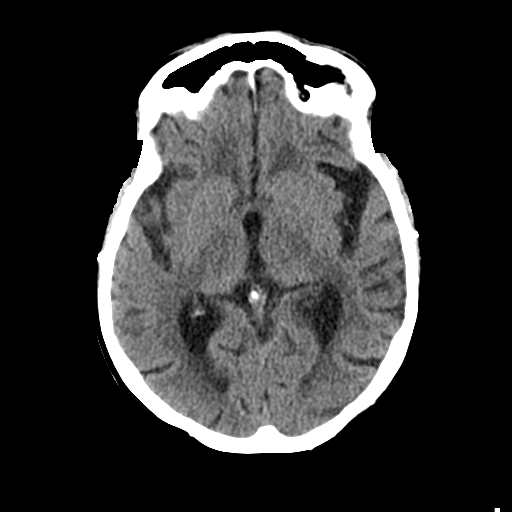
[im 15/28  brain]
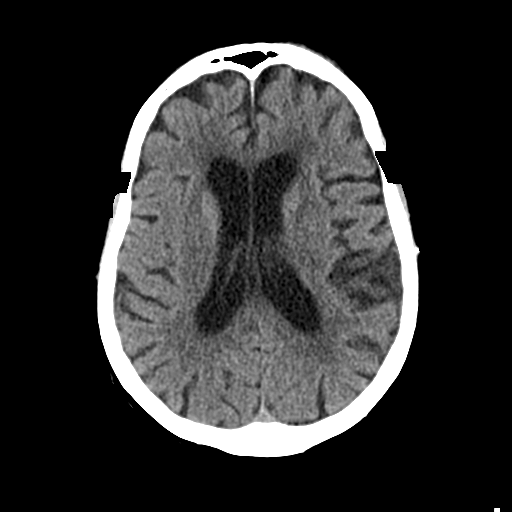
[im 15/28  bone]
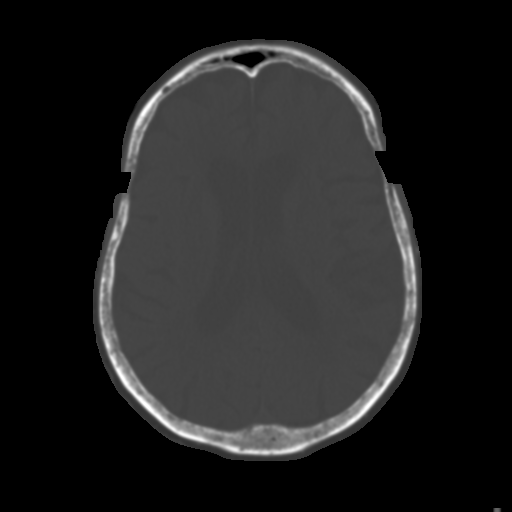
[im 17/28  brain]
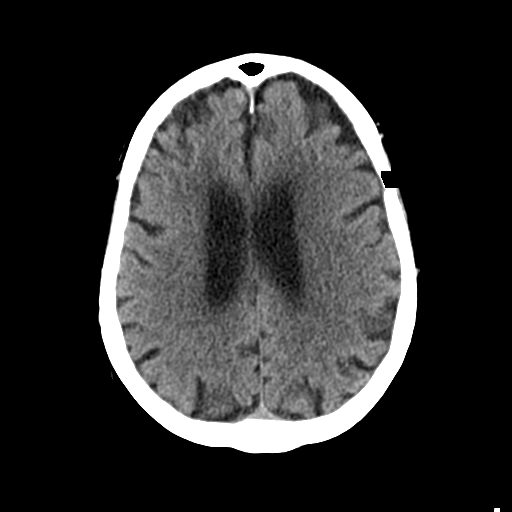
[im 20/28  brain]
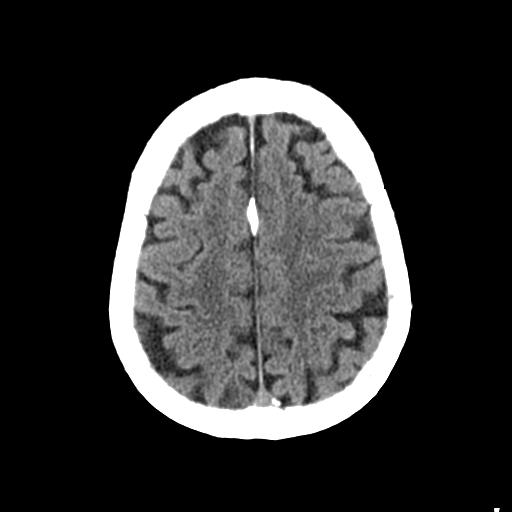
[im 23/28  brain]
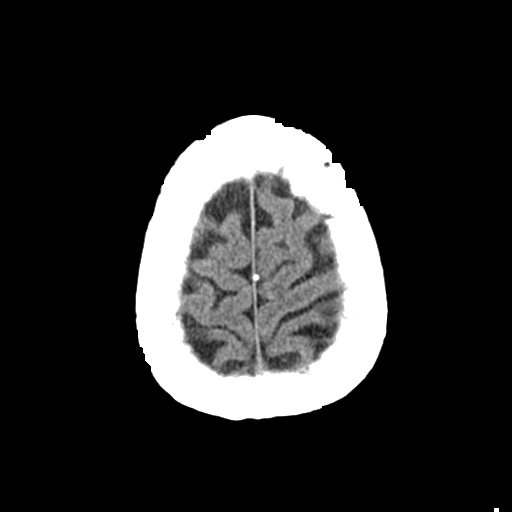
[im 26/28  brain]
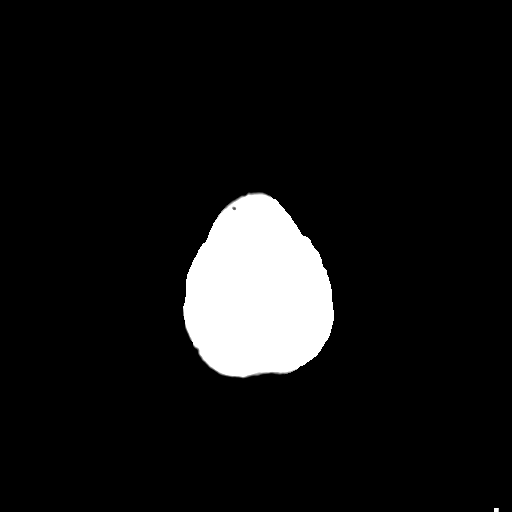
[im 26/28  bone]
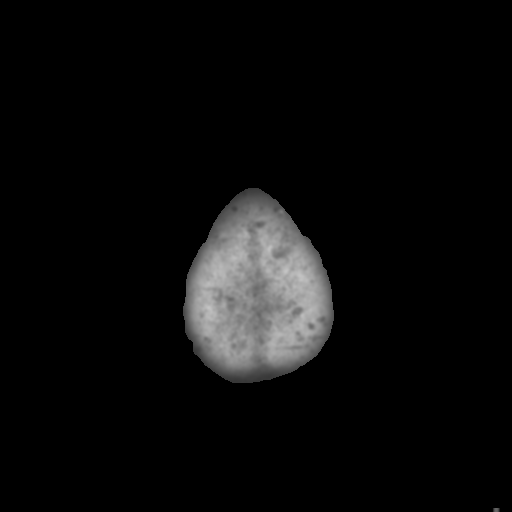

[Series 4: coronal soft tissue · coronal · 0.29mm/px · 3 of 65 slices shown]
[im 22/65  brain]
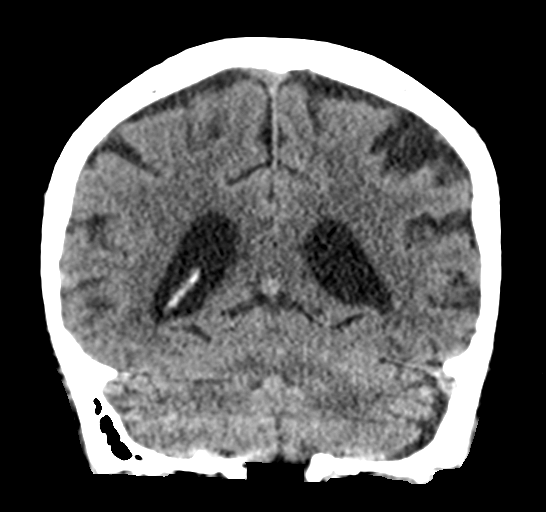
[im 29/65  brain]
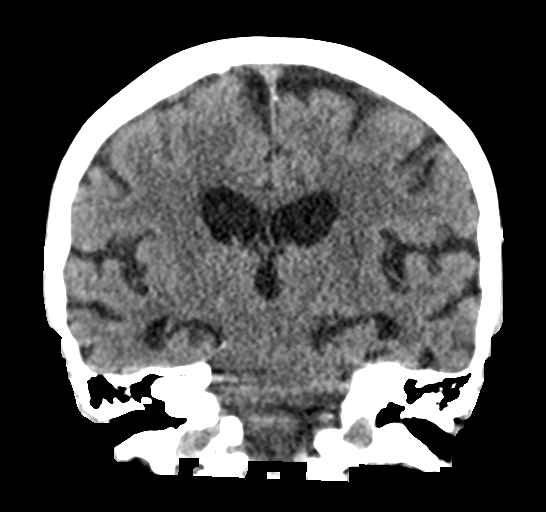
[im 36/65  brain]
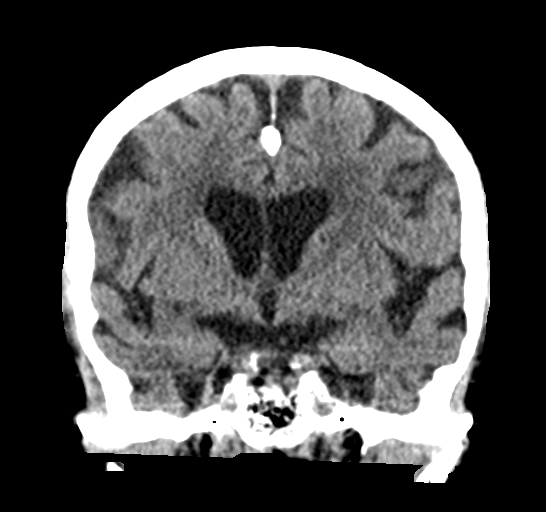

[Series 5: sagittal soft tissue · sagittal · 0.30mm/px · 3 of 49 slices shown]
[im 17/49  brain]
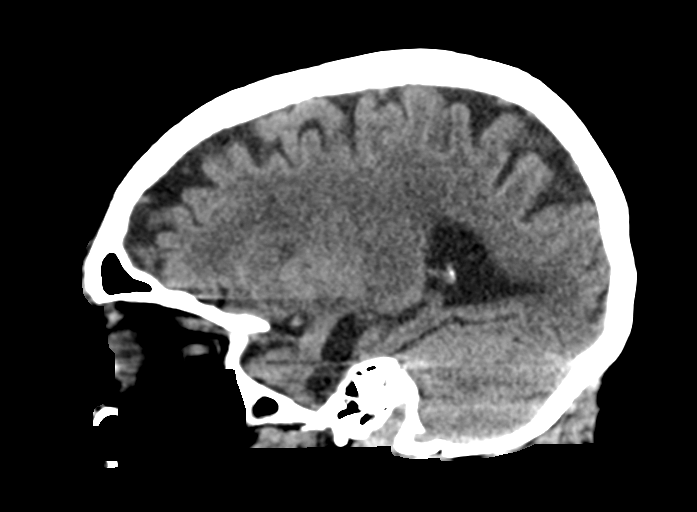
[im 25/49  brain]
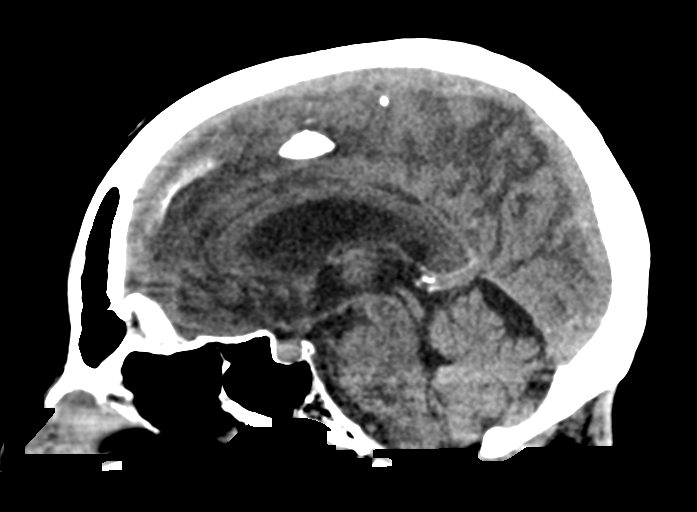
[im 33/49  brain]
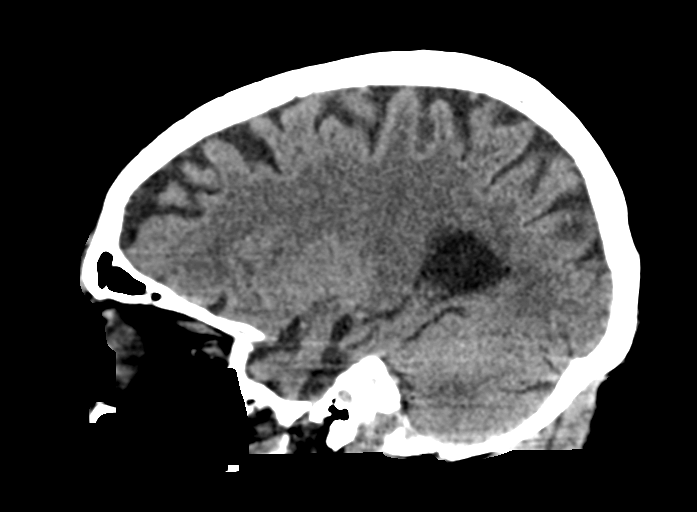

[15 of 45 positions shown; findings below may reference images not displayed]

FINDINGS: Brain: No acute infarct or hemorrhage. Lateral ventricles and
midline structures are unremarkable. No acute extra-axial fluid
collections. No mass effect.

Vascular: No hyperdense vessel or unexpected calcification.

Skull: Mottled lucencies throughout the calvarium are unchanged
since prior study. No acute displaced fractures.

Sinuses/Orbits: No acute finding.

Other: None.
IMPRESSION: 1. No acute intracranial process.
2. Stable mottled lucency throughout the calvarium, consistent with
chronic demineralization, anemia, or marrow replacement process.

## 2021-03-03 IMAGING — DX DG CHEST 1V PORT
1 series · 1 of 1 positions shown · non-contrast
Comparison: Chest radiograph dated 07/12/2020.

CLINICAL DATA: 89-year-old female with altered mental status.

EXAM:
PORTABLE CHEST 1 VIEW

[chest ap]
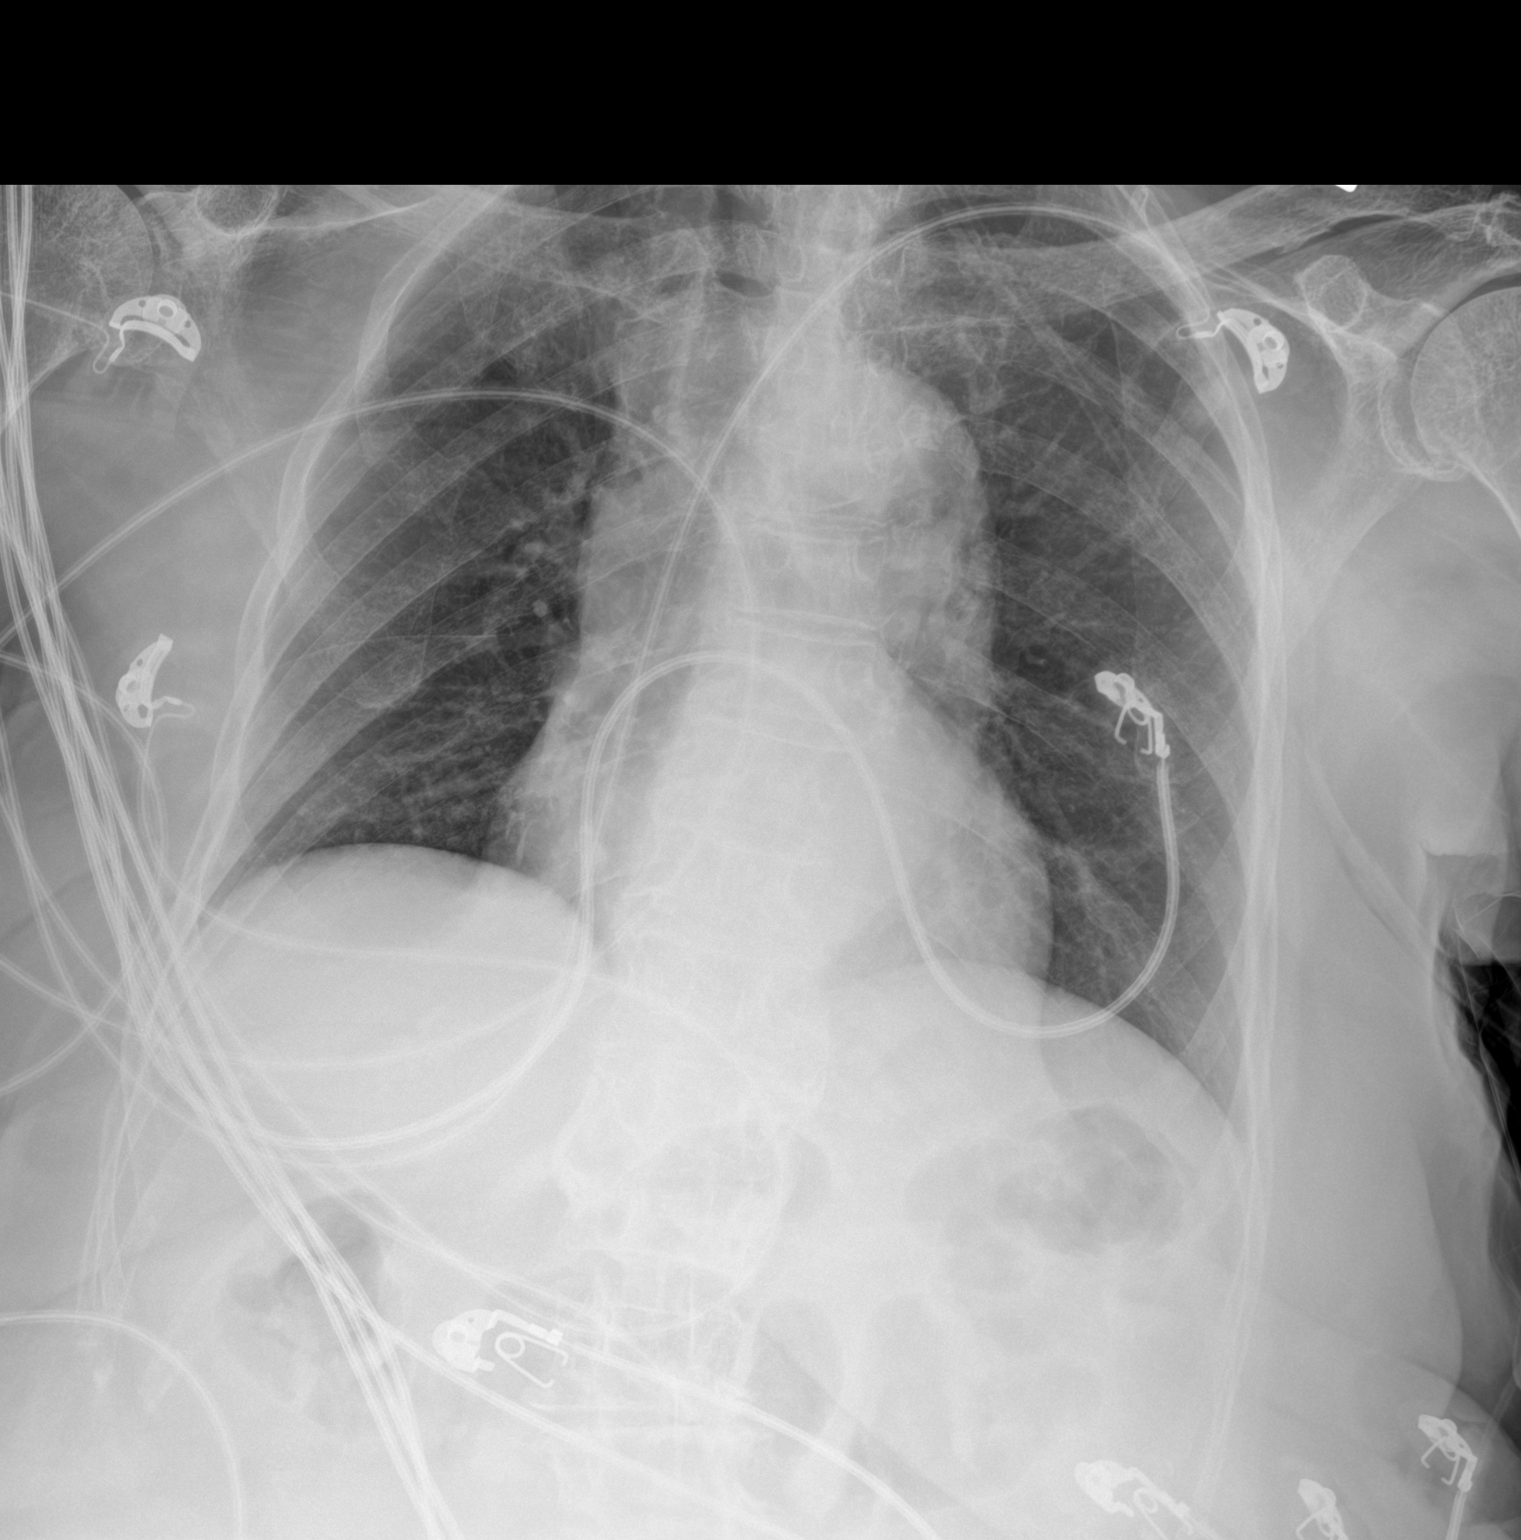

[1 of 1 positions shown; findings below may reference images not displayed]

FINDINGS: The lungs are clear. There is no pleural effusion pneumothorax. The
cardiac silhouette is within limits. No acute osseous pathology.
Osteopenia. Degenerative changes of the spine.
IMPRESSION: No active disease.
